# Patient Record
Sex: Female | Born: 1985 | Race: White | Hispanic: No | Marital: Single | Smoking: Never smoker
Health system: Southern US, Community
[De-identification: ages and names within clinical notes are randomized; demographics above are authoritative.]

## PROBLEM LIST (undated history)

## (undated) DIAGNOSIS — N390 Urinary tract infection, site not specified: Secondary | ICD-10-CM

## (undated) DIAGNOSIS — F419 Anxiety disorder, unspecified: Secondary | ICD-10-CM

## (undated) DIAGNOSIS — R519 Headache, unspecified: Secondary | ICD-10-CM

## (undated) DIAGNOSIS — F32A Depression, unspecified: Secondary | ICD-10-CM

## (undated) DIAGNOSIS — M1990 Unspecified osteoarthritis, unspecified site: Secondary | ICD-10-CM

## (undated) DIAGNOSIS — I639 Cerebral infarction, unspecified: Secondary | ICD-10-CM

## (undated) DIAGNOSIS — Z8619 Personal history of other infectious and parasitic diseases: Secondary | ICD-10-CM

## (undated) DIAGNOSIS — H6690 Otitis media, unspecified, unspecified ear: Secondary | ICD-10-CM

## (undated) DIAGNOSIS — F329 Major depressive disorder, single episode, unspecified: Secondary | ICD-10-CM

## (undated) DIAGNOSIS — R51 Headache: Secondary | ICD-10-CM

## (undated) DIAGNOSIS — M069 Rheumatoid arthritis, unspecified: Secondary | ICD-10-CM

## (undated) DIAGNOSIS — D649 Anemia, unspecified: Secondary | ICD-10-CM

## (undated) HISTORY — DX: Personal history of other infectious and parasitic diseases: Z86.19

## (undated) HISTORY — PX: TONSILECTOMY, ADENOIDECTOMY, BILATERAL MYRINGOTOMY AND TUBES: SHX2538

## (undated) HISTORY — DX: Unspecified osteoarthritis, unspecified site: M19.90

## (undated) HISTORY — DX: Urinary tract infection, site not specified: N39.0

## (undated) HISTORY — DX: Cerebral infarction, unspecified: I63.9

## (undated) HISTORY — DX: Headache, unspecified: R51.9

## (undated) HISTORY — DX: Headache: R51

## (undated) HISTORY — DX: Otitis media, unspecified, unspecified ear: H66.90

## (undated) HISTORY — DX: Rheumatoid arthritis, unspecified: M06.9

## (undated) HISTORY — DX: Major depressive disorder, single episode, unspecified: F32.9

## (undated) HISTORY — DX: Depression, unspecified: F32.A

## (undated) HISTORY — DX: Anemia, unspecified: D64.9

---
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
## 2005-04-09 ENCOUNTER — Other Ambulatory Visit: Admission: RE | Admit: 2005-04-09 | Discharge: 2005-04-09 | Payer: Self-pay | Admitting: Obstetrics and Gynecology

## 2006-05-22 ENCOUNTER — Other Ambulatory Visit: Admission: RE | Admit: 2006-05-22 | Discharge: 2006-05-22 | Payer: Self-pay | Admitting: Obstetrics & Gynecology

## 2007-06-01 ENCOUNTER — Other Ambulatory Visit: Admission: RE | Admit: 2007-06-01 | Discharge: 2007-06-01 | Payer: Self-pay | Admitting: Obstetrics and Gynecology

## 2008-03-27 ENCOUNTER — Emergency Department (HOSPITAL_COMMUNITY): Admission: EM | Admit: 2008-03-27 | Discharge: 2008-03-27 | Payer: Self-pay | Admitting: Emergency Medicine

## 2008-08-07 ENCOUNTER — Other Ambulatory Visit: Admission: RE | Admit: 2008-08-07 | Discharge: 2008-08-07 | Payer: Self-pay | Admitting: Obstetrics & Gynecology

## 2010-04-14 HISTORY — PX: APPENDECTOMY: SHX54

## 2010-10-08 ENCOUNTER — Inpatient Hospital Stay: Payer: Self-pay | Admitting: Surgery

## 2010-10-10 LAB — PATHOLOGY REPORT

## 2011-01-17 LAB — URINALYSIS, ROUTINE W REFLEX MICROSCOPIC
Glucose, UA: NEGATIVE mg/dL
Hgb urine dipstick: NEGATIVE
Ketones, ur: NEGATIVE mg/dL
Protein, ur: NEGATIVE mg/dL
pH: 5.5 (ref 5.0–8.0)

## 2011-08-21 IMAGING — CT CT ABD-PELV W/ CM
1 of 2 series · 15 of 32 positions shown, 19 images · non-contrast
Comparison: none

REASON FOR EXAM: (1) abdominal pain; (2) abdominal pain
COMMENTS:   May transport without cardiac monitor

[Series 2: 3mm soft tissue · axial · 0.81mm/px · z∈[-982,-532]mm · 15 of 164 slices shown, 19 images]
[im 7/164  soft-tissue]
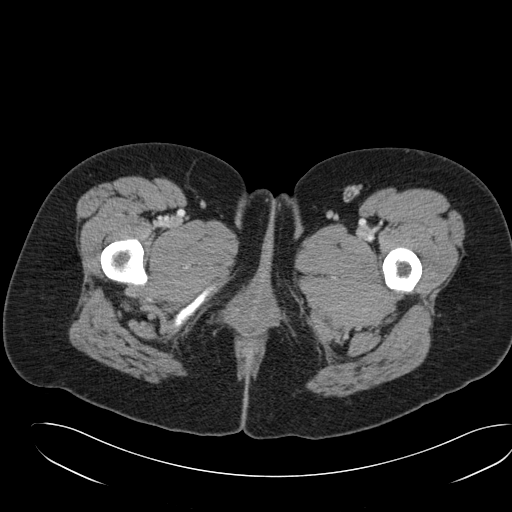
[im 7/164  bone]
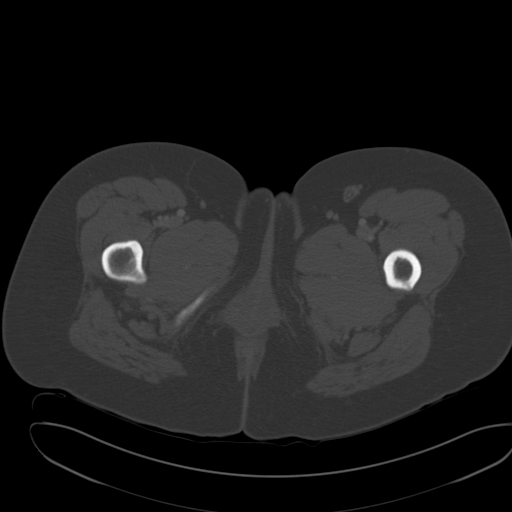
[im 21/164  soft-tissue]
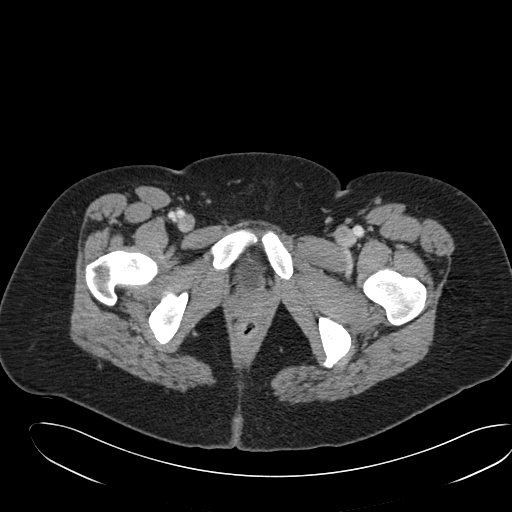
[im 34/164  soft-tissue]
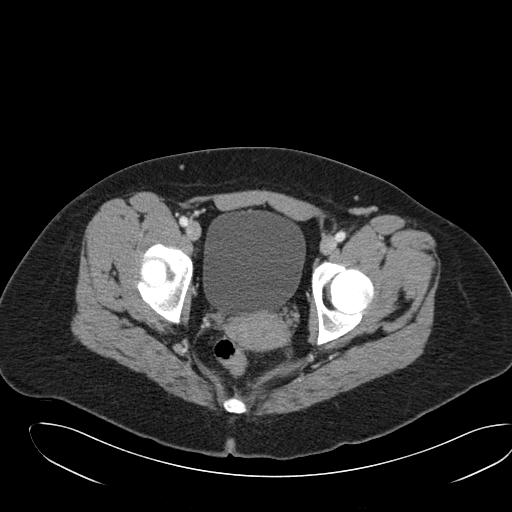
[im 48/164  soft-tissue]
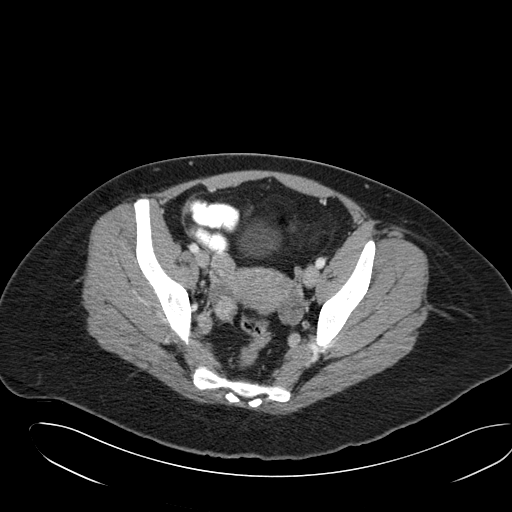
[im 55/164  soft-tissue]
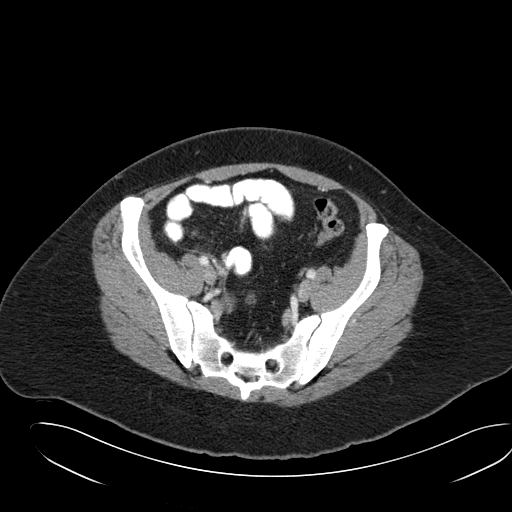
[im 68/164  soft-tissue]
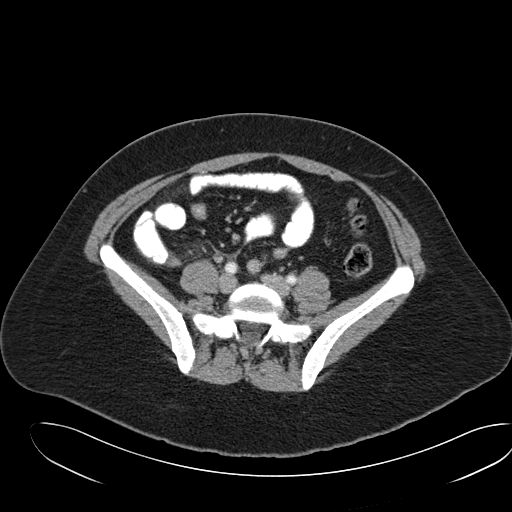
[im 82/164  soft-tissue]
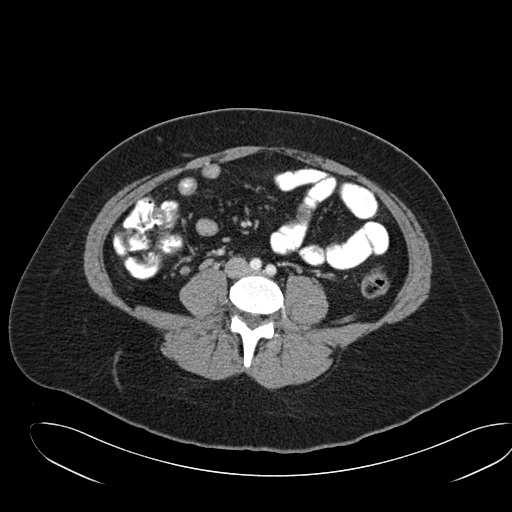
[im 96/164  soft-tissue]
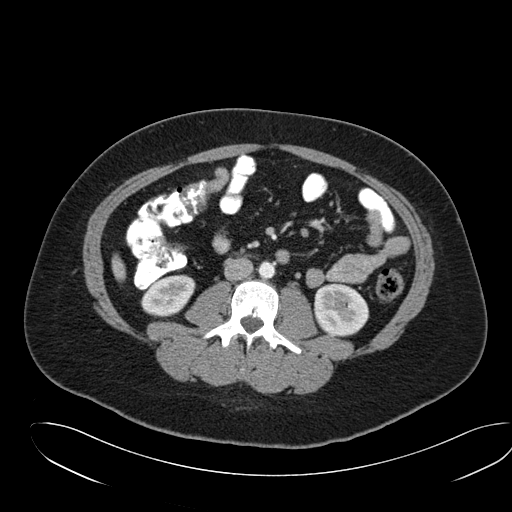
[im 109/164  soft-tissue]
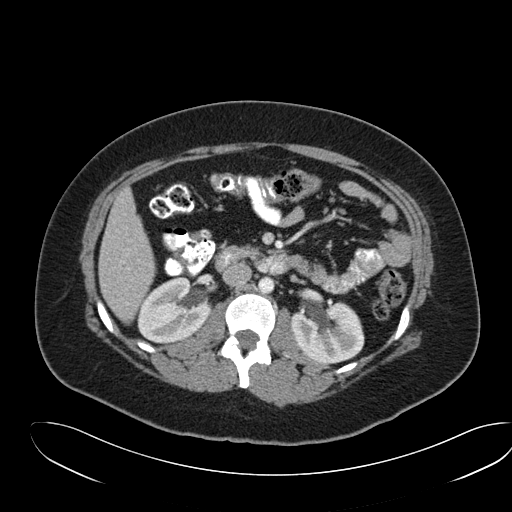
[im 109/164  bone]
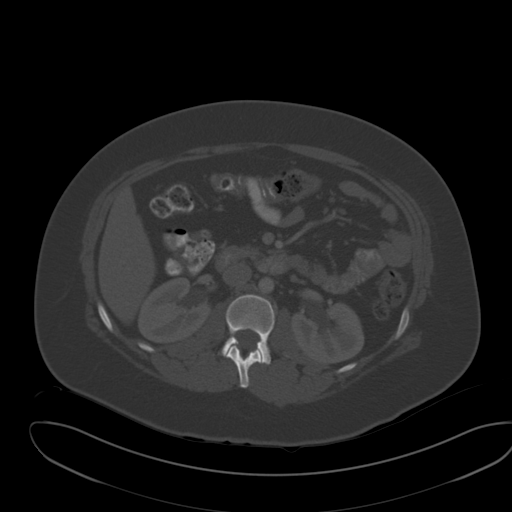
[im 116/164  soft-tissue]
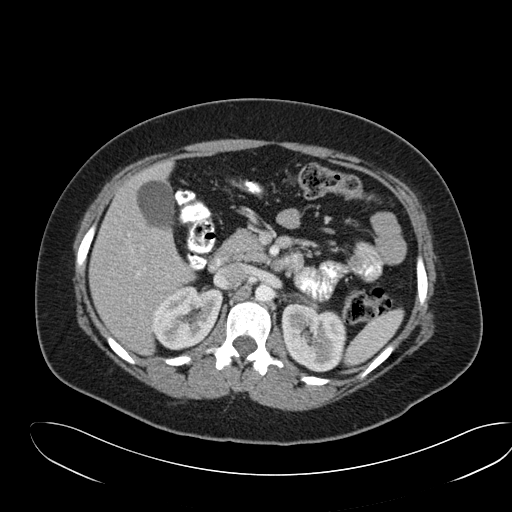
[im 130/164  soft-tissue]
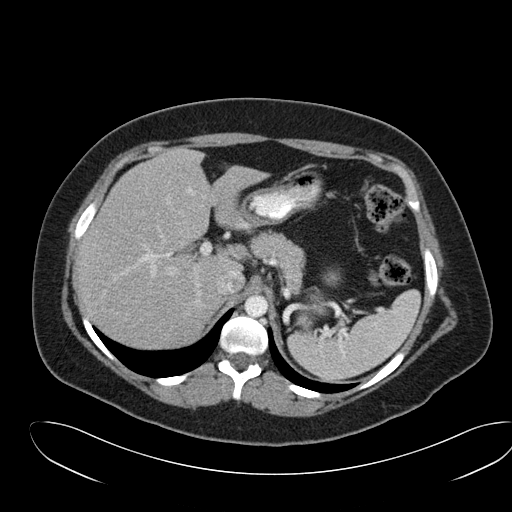
[im 136/164  lung]
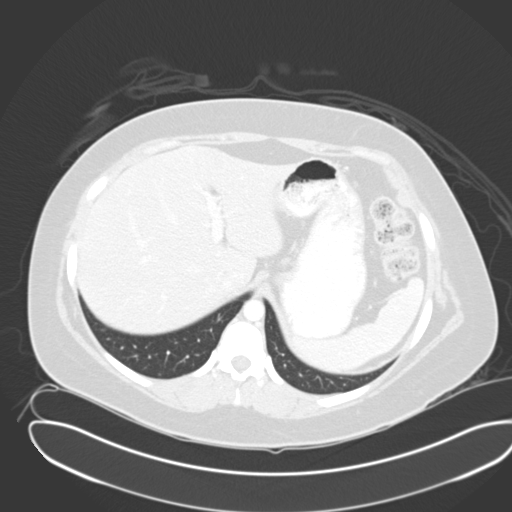
[im 143/164  soft-tissue]
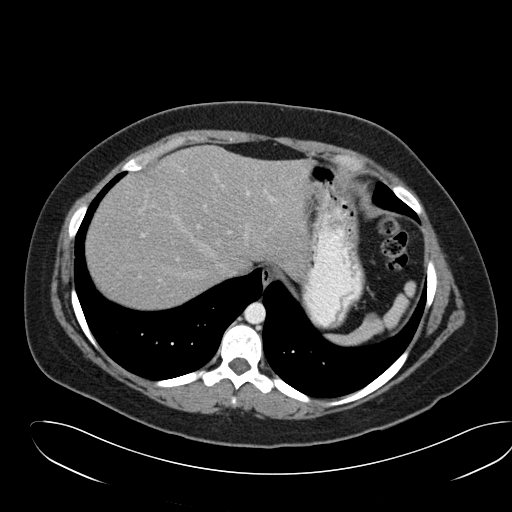
[im 143/164  lung]
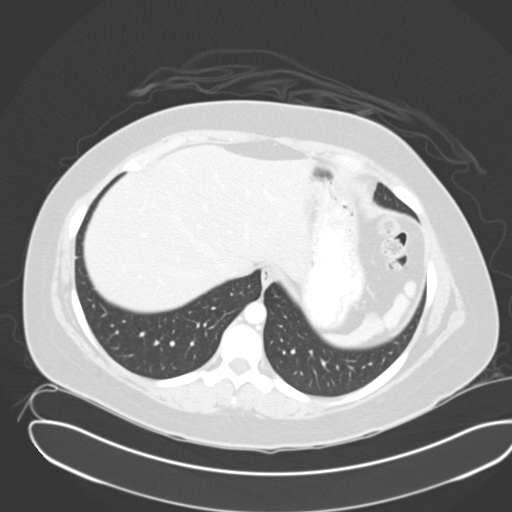
[im 150/164  lung]
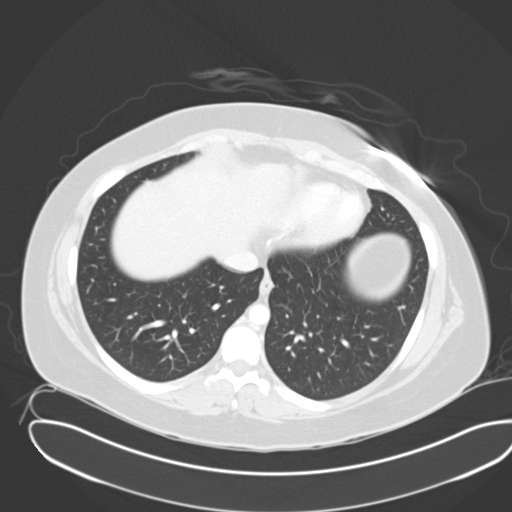
[im 157/164  soft-tissue]
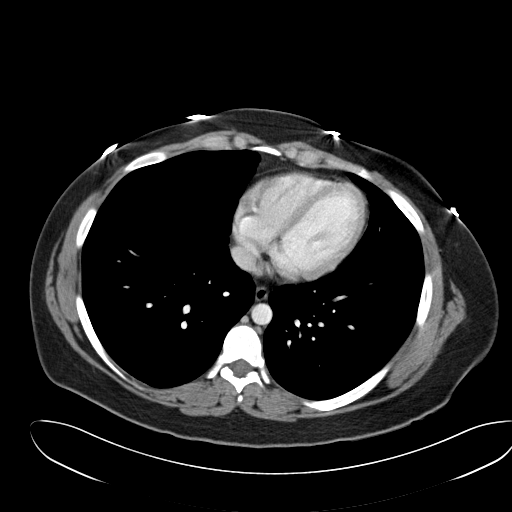
[im 157/164  lung]
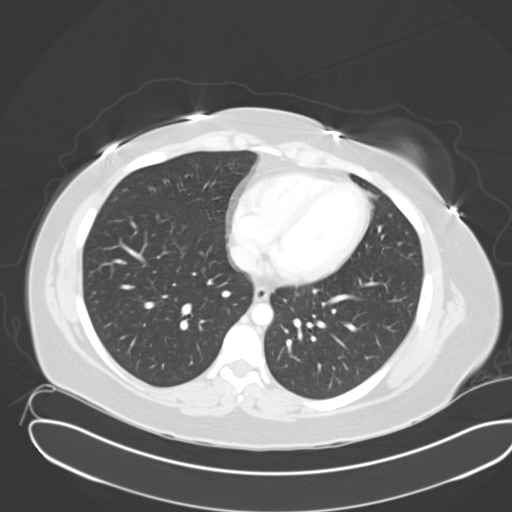

[15 of 32 positions shown; findings below may reference images not displayed]

PROCEDURE:     CT  - CT ABDOMEN / PELVIS  W  - October 08, 2010  [DATE]

RESULT:     CT of the abdomen and pelvis is performed utilizing 100 mL of
0sovue-T9T iodinated intravenous contrast. There is no previous exam for
comparison.

3 mm slice thickness reconstruction show the lung bases are clear. There is
mild periappendiceal inflammatory stranding with a slightly thickened
appendiceal wall. There is no evidence of perforation or abscess formation.
The uterus and ovaries as well as urinary bladder appear within normal
limits. There is no evidence of abnormal bowel distention. The liver,
spleen, pancreas, gallbladder, adrenal glands, aorta and kidneys appear
unremarkable. There does appear to be some slight thickening of the wall of
a portion of the ileum in the right pelvis an anterior mid pelvic region
raising the possibility of enteritis. The colon appears unremarkable.
IMPRESSION: Findings suggestive of scattered enteritis and
appendicitis. Surgical consultation is recommended.

## 2012-04-29 ENCOUNTER — Ambulatory Visit (INDEPENDENT_AMBULATORY_CARE_PROVIDER_SITE_OTHER): Payer: Managed Care, Other (non HMO) | Admitting: Family Medicine

## 2012-04-29 VITALS — BP 142/85 | HR 80 | Temp 98.4°F | Resp 16 | Ht 67.0 in | Wt 211.0 lb

## 2012-04-29 DIAGNOSIS — R197 Diarrhea, unspecified: Secondary | ICD-10-CM

## 2012-04-29 DIAGNOSIS — R1084 Generalized abdominal pain: Secondary | ICD-10-CM

## 2012-04-29 DIAGNOSIS — M069 Rheumatoid arthritis, unspecified: Secondary | ICD-10-CM | POA: Insufficient documentation

## 2012-04-29 LAB — COMPREHENSIVE METABOLIC PANEL WITH GFR
ALT: 9 U/L (ref 0–35)
AST: 10 U/L (ref 0–37)
Albumin: 3.8 g/dL (ref 3.5–5.2)
Alkaline Phosphatase: 80 U/L (ref 39–117)
BUN: 11 mg/dL (ref 6–23)
CO2: 23 meq/L (ref 19–32)
Calcium: 9.3 mg/dL (ref 8.4–10.5)
Chloride: 108 meq/L (ref 96–112)
Creat: 0.8 mg/dL (ref 0.50–1.10)
Glucose, Bld: 72 mg/dL (ref 70–99)
Potassium: 4.7 meq/L (ref 3.5–5.3)
Sodium: 141 meq/L (ref 135–145)
Total Bilirubin: 0.4 mg/dL (ref 0.3–1.2)
Total Protein: 7.1 g/dL (ref 6.0–8.3)

## 2012-04-29 LAB — POCT CBC
Granulocyte percent: 76.9 % (ref 37–80)
HCT, POC: 44.7 % (ref 37.7–47.9)
Hemoglobin: 13.6 g/dL (ref 12.2–16.2)
Lymph, poc: 2.6 (ref 0.6–3.4)
MCH, POC: 24.6 pg — AB (ref 27–31.2)
MCHC: 30.4 g/dL — AB (ref 31.8–35.4)
MCV: 80.8 fL (ref 80–97)
MID (cbc): 0.7 (ref 0–0.9)
MPV: 10.8 fL (ref 0–99.8)
POC Granulocyte: 11.2 — AB (ref 2–6.9)
POC LYMPH PERCENT: 18.1 % (ref 10–50)
POC MID %: 5 % (ref 0–12)
Platelet Count, POC: 361 K/uL (ref 142–424)
RBC: 5.53 M/uL — AB (ref 4.04–5.48)
RDW, POC: 15.6 %
WBC: 14.5 K/uL — AB (ref 4.6–10.2)

## 2012-04-29 LAB — POCT SEDIMENTATION RATE: POCT SED RATE: 31 mm/hr — AB (ref 0–22)

## 2012-04-29 MED ORDER — DICYCLOMINE HCL 10 MG PO CAPS: 10.0000 mg | ORAL_CAPSULE | Freq: Three times a day (TID) | ORAL | Status: DC

## 2012-04-29 NOTE — Progress Notes (Signed)
[address] [place] [address]   Phone [phone]  Subjective:    Patient ID: Laura [name], female    DOB: 1985/08/25, 27 y.o.   MRN: [id number]  HPI Pt presents to clinic with 2 wk h/o diarrhea. On the day that it started pt had been having trouble with constipation so she used a laxative (1 dose) she had abd cramping and then had a BM.  But she continued with abdominal cramping and diarrhea since then - the stool started loose and then went to green water and now it is mainly water with just a little amount of stool.  It was brown in color and then went to green and now it is back brown again.  It has a terrible odor.  She is not more gasy than normal. She is going between 4-10x per day and she only goes during the day and after she gets abdominal cramping.  She has had no fever or chills.  She has noticed that caffeine and sweets make the cramping worse.  She has used no OTC medications.  Over the last 2 days she has eaten almost nothing and drank almost nothing because they both cause immediate cramping and then diarrhea. No sick contacts with similar symptoms.  Some nausea but no vomiting.  She traveled to [facility] and drank city water, no camping or foreign travel and she has not taken any recent antibiotics.  She has RA and was started on Plaquenil about 1 wk ago after her symptoms started and because her eating has been so erratic she has not been using it regularly.  She was diagnosed with IBS when she was a teenager but really has not had problems with it since then.  She does not believe she has lost any weight and she is not dizzy when she changes positions.    Review of Systems  Constitutional: Negative for fever and chills.  HENT: Negative for congestion.   Gastrointestinal: Positive for nausea, abdominal pain and diarrhea. Negative for vomiting and blood in stool.  Musculoskeletal: Positive for arthralgias (pt has RA).  Skin: Negative.        Objective:   Physical Exam    Vitals reviewed. Constitutional: She appears well-developed and well-nourished.  HENT:  Head: Normocephalic and atraumatic.  Right Ear: External ear normal.  Left Ear: External ear normal.  Eyes: Conjunctivae normal are normal.  Cardiovascular: Normal rate, regular rhythm and normal heart sounds.   Pulmonary/Chest: Effort normal and breath sounds normal.  Abdominal: Soft. Bowel sounds are normal.  Skin: Skin is warm and dry.  Psychiatric: She has a normal mood and affect. Her behavior is normal. Judgment and thought content normal.   Results for orders placed in visit on 04/29/12  POCT CBC      Component Value Range   WBC 14.5 (*) 4.6 - 10.2 K/uL   Lymph, poc 2.6  0.6 - 3.4   POC LYMPH PERCENT 18.1  10 - 50 %L   MID (cbc) 0.7  0 - 0.9   POC MID % 5.0  0 - 12 %M   POC Granulocyte 11.2 (*) 2 - 6.9   Granulocyte percent 76.9  37 - 80 %G   RBC 5.53 (*) 4.04 - 5.48 M/uL   Hemoglobin 13.6  12.2 - 16.2 g/dL   HCT, POC 21.3  08.6 - 47.9 %   MCV 80.8  80 - 97 fL   MCH, POC 24.6 (*) 27 - 31.2 pg  MCHC 30.4 (*) 31.8 - 35.4 g/dL   RDW, POC 16.1     Platelet Count, POC 361  142 - 424 K/uL   MPV 10.8  0 - 99.8 fL  POCT SEDIMENTATION RATE      Component Value Range   POCT SED RATE 31 (*) 0 - 22 mm/hr        Assessment & Plan:   1. Diarrhea  Clostridium difficile toxin, Stool culture, POCT CBC, Comprehensive metabolic panel, POCT SEDIMENTATION RATE, Fecal lactoferrin, Ova and parasite examination, Giardia/cryptosporidium (EIA), dicyclomine (BENTYL) 10 MG capsule  2. Generalized abdominal cramping     Due to time frame will check for infectious causes including C Diff.  Most concerning for Giardia due to symptoms.  Offered pt medication for treatment after stool samples are collected but pt declined.  Will treat her symptoms with Bentyl and pt can use OTC imodium after stool samples are collected and returned to [place].  She should make sure she stays hydrated and watch lactose intake due  to lactose intolerance created by diarrhea and possible giardia.  If results from cultures return neg and pt still having problems with refer to GI for evaluation.  Doubt inflammatory bowel disease due to almost normal Sed rate.  Answered patient's questions and she agrees with the above plan. Did suggest maybe the patient hold off on her Plaquenil until after symptoms resolve but she is to call her rheumatologist 1st.  D/w Dr Alwyn Ren.

## 2012-04-30 NOTE — Progress Notes (Signed)
Discussed workup and treatment options in detail, as well as getting lab advice from Dr. Cleta Alberts.  Advised and agree with plan.

## 2012-05-03 ENCOUNTER — Telehealth: Payer: Self-pay

## 2012-05-03 LAB — GIARDIA/CRYPTOSPORIDIUM (EIA)
Cryptosporidium Screen (EIA): NEGATIVE
Giardia Screen (EIA): NEGATIVE

## 2012-05-03 LAB — OVA AND PARASITE EXAMINATION

## 2012-05-03 NOTE — Telephone Encounter (Signed)
PATIENT CALLED ASKING HOW LONG HER RESULTS WERE GOING TO TAKE, SHE FEELS VERY ANXIOUS. SHE CAME IN Thursday AND WAS NEVER TOLD HOW LONG IT WOULD TAKE OR WHAT WAS GOING TO HAPPEN NEXT WHEN THEY CAME IN. PLEASE NOTIFY PATIENT AS SOON AS POSSIBLE THANK YOU!

## 2012-05-03 NOTE — Telephone Encounter (Signed)
Please advise 

## 2012-05-04 LAB — STOOL CULTURE

## 2012-05-04 NOTE — Telephone Encounter (Signed)
So far cultures are negative. We are still waiting on C. Diff culture.  How is she doing? If symptoms are persisting and labs all negative will go ahead with GI referral.

## 2012-05-04 NOTE — Telephone Encounter (Signed)
Pt called back and stated that someone had called her last night and she assumed her results were in. I advised her that there is still one outstanding. Pt would appreciate it if we could review the labs that are back and call her if possible before she has to go to work at 3:30. Can someone else please review labs since Maralyn Sago is not in today?

## 2012-05-04 NOTE — Telephone Encounter (Signed)
Advised pt of results that are back. Pt reports that her Sxs are about the same. She is able to keep them under control w/the medication we Rxd and Pepto Bismol but she thought she was getting better and didn't take any medication on Sunday and her Sxs came right back. Pt agreed to GI referral if C Diff comes back negative.  Maralyn Sago, forwarding this FYI.

## 2012-05-09 ENCOUNTER — Other Ambulatory Visit: Payer: Self-pay | Admitting: Physician Assistant

## 2012-05-09 DIAGNOSIS — R197 Diarrhea, unspecified: Secondary | ICD-10-CM

## 2012-05-09 NOTE — Progress Notes (Signed)
I called and spoke with patient regarding her lab results.  She is still having diarrhea if she does not take pepto - she has stopped her Bentyl though it helped a lot at first.  Will do a referral to GI.

## 2012-05-13 LAB — CLOSTRIDIUM DIFFICILE TOXIN: C diff Toxin A: NOT DETECTED

## 2012-05-18 ENCOUNTER — Encounter: Payer: Self-pay | Admitting: Physician Assistant

## 2012-05-18 DIAGNOSIS — R198 Other specified symptoms and signs involving the digestive system and abdomen: Secondary | ICD-10-CM | POA: Insufficient documentation

## 2012-06-27 ENCOUNTER — Emergency Department (HOSPITAL_COMMUNITY)
Admission: EM | Admit: 2012-06-27 | Discharge: 2012-06-27 | Disposition: A | Discharge status: Home / Self Care | Payer: Commercial Indemnity | Attending: Emergency Medicine | Admitting: Emergency Medicine

## 2012-06-27 ENCOUNTER — Emergency Department (HOSPITAL_COMMUNITY): Payer: Commercial Indemnity

## 2012-06-27 DIAGNOSIS — M25476 Effusion, unspecified foot: Secondary | ICD-10-CM | POA: Insufficient documentation

## 2012-06-27 DIAGNOSIS — Y9389 Activity, other specified: Secondary | ICD-10-CM | POA: Insufficient documentation

## 2012-06-27 DIAGNOSIS — Y929 Unspecified place or not applicable: Secondary | ICD-10-CM | POA: Insufficient documentation

## 2012-06-27 DIAGNOSIS — Z79899 Other long term (current) drug therapy: Secondary | ICD-10-CM | POA: Insufficient documentation

## 2012-06-27 DIAGNOSIS — S93409A Sprain of unspecified ligament of unspecified ankle, initial encounter: Secondary | ICD-10-CM | POA: Insufficient documentation

## 2012-06-27 DIAGNOSIS — M25473 Effusion, unspecified ankle: Secondary | ICD-10-CM | POA: Insufficient documentation

## 2012-06-27 DIAGNOSIS — X500XXA Overexertion from strenuous movement or load, initial encounter: Secondary | ICD-10-CM | POA: Insufficient documentation

## 2012-06-27 DIAGNOSIS — M129 Arthropathy, unspecified: Secondary | ICD-10-CM | POA: Insufficient documentation

## 2012-06-27 DIAGNOSIS — S93402A Sprain of unspecified ligament of left ankle, initial encounter: Secondary | ICD-10-CM

## 2012-06-27 MED ORDER — HYDROMORPHONE HCL PF 1 MG/ML IJ SOLN
1.0000 mg | Freq: Once | INTRAMUSCULAR | Status: AC
  Administered 2012-06-27: 1 mg via INTRAMUSCULAR
  Filled 2012-06-27: qty 1

## 2012-06-27 MED ORDER — NAPROXEN 500 MG PO TABS: 500.0000 mg | ORAL_TABLET | Freq: Two times a day (BID) | ORAL | Status: DC

## 2012-06-27 MED ORDER — OXYCODONE-ACETAMINOPHEN 5-325 MG PO TABS
1.0000 | ORAL_TABLET | Freq: Once | ORAL | Status: DC
  Filled 2012-06-27: qty 1

## 2012-06-27 MED ORDER — HYDROCODONE-ACETAMINOPHEN 5-325 MG PO TABS: 2.0000 | ORAL_TABLET | Freq: Four times a day (QID) | ORAL | Status: DC | PRN

## 2012-06-27 NOTE — ED Provider Notes (Signed)
History  This chart was scribed for non-physician practitioner Magnus Sinning, PA-C working with Toy Baker, MD, by Candelaria Stagers, ED Scribe. This patient was seen in room WTR7/WTR7 and the patient's care was started at 3:18 PM   CSN: [id number]  Arrival date & time 06/27/12  1444   First MD Initiated Contact with Patient 06/27/12 1505      No chief complaint on file.    The history is provided by the patient. No language interpreter was used.   Laura [name] is a 27 y.o. female who presents to the Emergency Department complaining of generalized left ankle pain that is worse to the lateral malleolus that started two days ago after she states she popped the ankle which is gradually worsening.  Pt denies trauma or injury to the ankle.  She denies bruising to the area, but is experiencing swelling that she states has worsened over the last three days.  Pt has sprained the left ankle in the past.  She has taken Tramadol with no relief.  Pt is tearful upon examination.  Pt states she was diagnosed with rheumatoid arthritis.  She denies any erythema or warmth of the ankle.  No pain in any of her other joints.  Past Medical History  Diagnosis Date  . Arthritis     Past Surgical History  Procedure Laterality Date  . Appendectomy    . Tonsilectomy, adenoidectomy, bilateral myringotomy and tubes      Family History  Problem Relation Age of Onset  . Arthritis Paternal Grandmother     History  Substance Use Topics  . Smoking status: Never Smoker   . Smokeless tobacco: Not on file  . Alcohol Use: Yes    OB History   Grav Para Term Preterm Abortions TAB SAB Ect Mult Living                  Review of Systems  Musculoskeletal: Positive for joint swelling (swelling over the lateral malleolus) and arthralgias (left ankle pain ).  Skin: Negative for wound.  All other systems reviewed and are negative.    Allergies  Amoxicillin  Home Medications   Current  Outpatient Rx  Name  Route  Sig  Dispense  Refill  . dicyclomine (BENTYL) 10 MG capsule   Oral   Take 1 capsule (10 mg total) by mouth 4 (four) times daily -  before meals and at bedtime.   30 capsule   0   . etonogestrel-ethinyl estradiol (NUVARING) 0.12-0.015 MG/24HR vaginal ring   Vaginal   Place 1 each vaginally every 28 (twenty-eight) days. Insert vaginally and leave in place for 3 consecutive weeks, then remove for 1 week.         . hydroxychloroquine (PLAQUENIL) 200 MG tablet   Oral   Take by mouth daily.           BP 152/113  Pulse 113  Temp(Src) 98.6 F (37 C) (Oral)  Resp 20  SpO2 96%  LMP 06/13/2012  Physical Exam  Nursing note and vitals reviewed. Constitutional: She is oriented to person, place, and time. She appears well-developed and well-nourished. No distress.  Pt is tearful upon examination.  Appears uncomfortable.   HENT:  Head: Normocephalic and atraumatic.  Eyes: EOM are normal.  Neck: Neck supple. No tracheal deviation present.  Cardiovascular: Normal rate and regular rhythm.   Pulmonary/Chest: Effort normal and breath sounds normal. No respiratory distress.  Musculoskeletal: Normal range of motion.  2+ dorsal pedal  pulse.  Achilles intact.  Tenderness to palpation of the medial and lateral malleolus.  Mild swelling of the lateral malleolus.  No bruising or erythema visualized.  ROM of left ankle limited secondary to pain.  Full ROM of left knee.  Able to wiggle toes on the left foot.    Neurological: She is alert and oriented to person, place, and time.  Sensation of all toes intact.    Skin: Skin is warm and dry.  Psychiatric: She has a normal mood and affect. Her behavior is normal.    ED Course  Procedures   DIAGNOSTIC STUDIES:    COORDINATION OF CARE:  3:30 PM Pt request pain medication.  Ordered 5-325 MG tablet of Percocet  3:43 PM Discussed course of care with pt which includes ankle aso, crutches, and pain medication.  Pt  understands and agrees.   Labs Reviewed - No data to display Dg Ankle Complete Left  06/27/2012  *RADIOLOGY REPORT*  Clinical Data: Left ankle pain following injury.  LEFT ANKLE COMPLETE - 3+ VIEW  Comparison: None  Findings: No evidence of acute fracture, subluxation or dislocation identified.  No radio-opaque foreign bodies are present.  No focal bony lesions are noted.  The joint spaces are unremarkable.  The ankle mortise is intact.  The talar dome is unremarkable.  Lateral soft tissue swelling is present.  IMPRESSION: Soft tissue swelling without acute bony abnormality.   Original Report Authenticated By: Harmon Pier, M.D.      No diagnosis found.    MDM  Patient presenting with ankle pain.  Xray negative.  Neurovascularly intact.  No erythema, warmth, or bruising of the ankle.  Patient given Ankle ASO and crutches.    I personally performed the services described in this documentation, which was scribed in my presence. The recorded information has been reviewed and is accurate.       Pascal Lux [place], PA-C 06/27/12 1627

## 2012-06-27 NOTE — ED Notes (Signed)
Pt states she popped her left ankle on Friday and pain has become increasingly worse pt is tearful. Pain is on outer ankle and around back of ankle. Pt outer ankle in edematous.

## 2012-06-28 NOTE — ED Provider Notes (Signed)
Medical screening examination/treatment/procedure(s) were conducted as a shared visit with non-physician practitioner(s) and myself.  I personally evaluated the patient during the encounter  Merit Gadsby T Sriram Febles, MD 06/28/12 1437 

## 2012-11-24 ENCOUNTER — Other Ambulatory Visit: Payer: Self-pay | Admitting: Certified Nurse Midwife

## 2012-11-25 ENCOUNTER — Telehealth: Payer: Self-pay | Admitting: Certified Nurse Midwife

## 2012-11-25 MED ORDER — ETONOGESTREL-ETHINYL ESTRADIOL 0.12-0.015 MG/24HR VA RING: 1.0000 | VAGINAL_RING | VAGINAL | Status: DC

## 2012-11-25 NOTE — Telephone Encounter (Signed)
Pt is requesting refill on NUVARING.   Last filled - 11/18/11, X 8 MONTHS Last AEX - 04/17/11 Next AEX - not scheduled   Pt states she should have inserted new ring yesterday.  Is it OK for pt to insert late?  Is this OK to refill if pt schedules? Please advise.

## 2012-11-25 NOTE — Telephone Encounter (Addendum)
Patient needs her nuvaring refilled . Need to have this refilled today . Already a day be hide.

## 2012-11-25 NOTE — Telephone Encounter (Signed)
Patient is notified of Nuvaring being refilled only once,  because pt needs a AEX. (Is aware that she cannot have anymore refills until this is done.  Pt states that she will no longer be using our practice as her GYN.  Nuvaring #[id number]'s sent to [facility], pt is aware.

## 2012-11-25 NOTE — Telephone Encounter (Signed)
eScribe request for refill on NUVARING Last filled - 11/18/11, X 8 MONTHS Last AEX - 04/17/11 Next AEX - not scheduled   Pt also called requesting refill.  Note to sent Sara Chu for advice.

## 2012-11-25 NOTE — Telephone Encounter (Signed)
Ok to refill one Nuvaring needs aex before any other refills

## 2014-03-28 ENCOUNTER — Encounter: Payer: Self-pay | Admitting: Family Medicine

## 2014-03-28 ENCOUNTER — Ambulatory Visit (INDEPENDENT_AMBULATORY_CARE_PROVIDER_SITE_OTHER): Payer: Commercial Indemnity | Admitting: Family Medicine

## 2014-03-28 VITALS — BP 130/82 | HR 76 | Temp 98.2°F | Ht 65.5 in | Wt 230.1 lb

## 2014-03-28 DIAGNOSIS — Z7689 Persons encountering health services in other specified circumstances: Secondary | ICD-10-CM

## 2014-03-28 DIAGNOSIS — F329 Major depressive disorder, single episode, unspecified: Secondary | ICD-10-CM | POA: Insufficient documentation

## 2014-03-28 DIAGNOSIS — Z7189 Other specified counseling: Secondary | ICD-10-CM

## 2014-03-28 DIAGNOSIS — M2550 Pain in unspecified joint: Secondary | ICD-10-CM | POA: Insufficient documentation

## 2014-03-28 DIAGNOSIS — F32A Depression, unspecified: Secondary | ICD-10-CM | POA: Insufficient documentation

## 2014-03-28 DIAGNOSIS — K589 Irritable bowel syndrome without diarrhea: Secondary | ICD-10-CM

## 2014-03-28 DIAGNOSIS — Z8719 Personal history of other diseases of the digestive system: Secondary | ICD-10-CM | POA: Insufficient documentation

## 2014-03-28 DIAGNOSIS — R739 Hyperglycemia, unspecified: Secondary | ICD-10-CM

## 2014-03-28 HISTORY — DX: Depression, unspecified: F32.A

## 2014-03-28 MED ORDER — DICYCLOMINE HCL 20 MG PO TABS: 20.0000 mg | ORAL_TABLET | Freq: Four times a day (QID) | ORAL | Status: DC

## 2014-03-28 NOTE — Progress Notes (Signed)
Pre visit review using our clinic review tool, if applicable. No additional management support is needed unless otherwise documented below in the visit note. 

## 2014-03-28 NOTE — Progress Notes (Signed)
HPI:  Laura [name] is here to establish care. New to getting a family doctor - last family doctor stopped practicing 7 years ago. Sees Dr. Linda Hedges in gyn for annual exams, and heavy bleeding. Also seeing a psychiatrist, Dr. Debbora Dus for depression and on Lexapro and a rheumatologist - Dr. Ouida Sills for RA. Reports had hgba1c and lipid in September and these were good except PRE-diabetes.  Has the following chronic problems that require follow up and concerns today:  Depression: -seeing psychiatrist (Dr. 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 Ouida Sills, reports has actually doing well  IBS: -diagnosed a long time ago -for the last few years intermittent symptoms -seen in urgent care earlier this year and reports had stool studies and labs and all normal -has had constipation and diarrhea in the past, pain before episodes -the last few years intermittent diarrhea and urgency to go, cramping -reports saw a gastroenterologist a few years ago and did trial probiotic which made things worse -denies: weight loss, blood in stools, weight loss, nausea or vomiting   ROS negative for unless reported above: fevers, unintentional weight loss, hearing or vision loss, chest pain, palpitations, struggling to breath, hemoptysis, melena, hematochezia, hematuria, falls, loc, si, thoughts of self harm  Past Medical History  Diagnosis Date  . Arthritis   . Depression   . Rheumatoid arthritis   . Frequent headaches   . History of chicken pox   . UTI (lower urinary tract infection)     Past Surgical History  Procedure Laterality Date  . Tonsilectomy, adenoidectomy, bilateral myringotomy and tubes    . Hymenectomy  2000  . Appendectomy  2012    Family History  Problem Relation Age of  Onset  . Arthritis Paternal Grandmother     History   Social History  . Marital Status: Single    Spouse Name: N/A    Number of Children: N/A  . Years of Education: N/A   Social History Main Topics  . Smoking status: Never Smoker   . Smokeless tobacco: None  . Alcohol Use: Yes  . Drug Use: No  . Sexual Activity: None   Other Topics Concern  . 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 As needed, Disp: 120 tablet, Rfl: 1;  escitalopram (LEXAPRO) 10 MG tablet, Take by mouth daily. , Disp: , Rfl: ;  ZENCHENT FE 0.4-35 MG-MCG tablet, Chew by mouth daily. , Disp: , Rfl:   EXAM:  Filed Vitals:   03/28/14 1413  BP: 130/82  Pulse: 76  Temp: 98.2 F (36.8 C)    Body mass index is 37.69 kg/(m^2).  GENERAL: vitals reviewed and listed above, alert, oriented, appears well hydrated and in no acute distress  HEENT: atraumatic, conjunttiva clear, no obvious abnormalities on inspection of external nose and ears  NECK: no obvious masses on inspection  LUNGS: clear to auscultation bilaterally, no wheezes, rales or rhonchi, good air  movement  ABD: BS+, soft, NTTP  CV: HRRR, no peripheral edema  MS: moves all extremities without noticeable abnormality  PSYCH: pleasant and cooperative, no obvious depression or anxiety  ASSESSMENT AND PLAN:  Discussed the following assessment and plan:  IBS (irritable bowel syndrome) - Plan: dicyclomine (BENTYL) 20 MG tablet  Polyarthralgia  Depression  Encounter to establish care  History of IBS  Hyperglycemia  -We reviewed the PMH, PSH, FH, SH, Meds and Allergies. -We provided refills for any medications we will prescribe as  needed. -We addressed current concerns per orders and patient instructions. -We have asked for records for pertinent exams, studies, vaccines and notes from previous providers. -We have advised patient to follow up per instructions below. -TTG, trial FODMAP diet and trial bentyl for bowel issues -she is getting TSH, CMP and some other labs at [place] with psych and will let me know on these results -Hgba1c recheck here for baseline -lab order given to take to her work -follow up 2 months  -Patient advised to return or notify a doctor immediately if symptoms worsen or persist or new concerns arise.  Patient Instructions  BEFORE YOU LEAVE: -schedule follow up in 2 months  -We have ordered labs or studies at this visit. It can take up to 1-2 weeks for results and processing. We will contact you with instructions IF your results are abnormal. Normal results will be released to your [facility]. If you have not heard from [place] or can not find your results in [facility] in 2 weeks please contact our office.  -PLEASE SIGN UP FOR MYCHART TODAY   We recommend the following healthy lifestyle measures: - eat a healthy diet consisting of lots of vegetables, fruits, beans, nuts, seeds, healthy meats such as white chicken and fish and whole grains.  - avoid fried foods, fast food, processed foods, sodas, red meet and other fattening foods.  - get a least 150 minutes of aerobic exercise per week.        Colin Benton R.

## 2014-03-28 NOTE — Patient Instructions (Signed)
BEFORE YOU LEAVE: -schedule follow up in 2 months  -We have ordered labs or studies at this visit. It can take up to 1-2 weeks for results and processing. We will contact you with instructions IF your results are abnormal. Normal results will be released to your [facility]. If you have not heard from [place] or can not find your results in [facility] in 2 weeks please contact our office.  -PLEASE SIGN UP FOR MYCHART TODAY   We recommend the following healthy lifestyle measures: - eat a healthy diet consisting of lots of vegetables, fruits, beans, nuts, seeds, healthy meats such as white chicken and fish and whole grains.  - avoid fried foods, fast food, processed foods, sodas, red meet and other fattening foods.  - get a least 150 minutes of aerobic exercise per week.

## 2014-05-29 ENCOUNTER — Ambulatory Visit: Payer: Commercial Indemnity | Admitting: Family Medicine

## 2014-06-14 ENCOUNTER — Encounter: Payer: Self-pay | Admitting: Family Medicine

## 2014-06-15 ENCOUNTER — Ambulatory Visit (INDEPENDENT_AMBULATORY_CARE_PROVIDER_SITE_OTHER): Payer: Commercial Indemnity | Admitting: Family Medicine

## 2014-06-15 ENCOUNTER — Encounter: Payer: Self-pay | Admitting: Family Medicine

## 2014-06-15 VITALS — HR 91 | Temp 98.3°F | Ht 65.5 in | Wt 230.2 lb

## 2014-06-15 DIAGNOSIS — E559 Vitamin D deficiency, unspecified: Secondary | ICD-10-CM

## 2014-06-15 DIAGNOSIS — K589 Irritable bowel syndrome without diarrhea: Secondary | ICD-10-CM

## 2014-06-15 DIAGNOSIS — F329 Major depressive disorder, single episode, unspecified: Secondary | ICD-10-CM

## 2014-06-15 DIAGNOSIS — F32A Depression, unspecified: Secondary | ICD-10-CM

## 2014-06-15 DIAGNOSIS — M2550 Pain in unspecified joint: Secondary | ICD-10-CM

## 2014-06-15 NOTE — Progress Notes (Signed)
Pre visit review using our clinic review tool, if applicable. No additional management support is needed unless otherwise documented below in the visit note. 

## 2014-06-15 NOTE — Patient Instructions (Signed)
Vit D3 1000-2000 IU daily ([facility] brand)

## 2014-06-15 NOTE — Progress Notes (Signed)
HPI:  Laura [name] is here for follow up. New to our practice 03/2014. Sees Dr. Linda Hedges in gyn for annual exams, and heavy bleeding. Also seeing a psychiatrist, Dr. Debbora Dus for depression and a rheumatologist - Dr. Ouida Sills for RA. Reports had hgba1c and lipid in September and these were good except PRE-diabetes.  HM: -HIV declined -pap doing next week with gyn -Tdap - done in 2013 er her report -influenza vaccine declined  IBS: -diagnosed a long time ago -for the last few years intermittent symptoms -seen in urgent care earlier this year and reports had stool studies and labs and all normal -has had constipation and diarrhea in the past, pain before episodes -the last few years intermittent diarrhea and urgency to go, cramping -reports saw a gastroenterologist a few years ago and did trial probiotic which made things worse -ttg negative, she reported doing TSH, CMP and other labs all ok except Vit D low -trial FODMAP 03/2014 and bentyl last visit - but she reports hasn't really had many symptoms -denies: weight loss, blood in stools, nausea or vomiting  Hyperglycemia: -prediabetes on labs 12/2013 per her report -HgbA1c 5.6 on our recheck -diet and exercise: -denies:vision changes, polyuria, polydipsia  Depression: -managed by psychiatrist psychiatrist (Dr. 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 Ouida Sills, reports has actually doing well  Vit D def: -9 on recent labs   ROS: See pertinent positives and negatives per HPI.  Past Medical History  Diagnosis Date  . Arthritis   . Depression   . Rheumatoid arthritis   . Frequent headaches   . History of chicken pox   . UTI (lower urinary tract infection)     Past Surgical History  Procedure Laterality Date  .  Tonsilectomy, adenoidectomy, bilateral myringotomy and tubes    . Hymenectomy  2000  . Appendectomy  2012    Family History  Problem Relation Age of Onset  . Arthritis Paternal Grandmother     History   Social History  . Marital Status: Single    Spouse Name: N/A  . Number of Children: N/A  . Years of Education: N/A   Social History Main Topics  . Smoking status: Never Smoker   . Smokeless tobacco: Not on file  . Alcohol Use: Yes  . Drug Use: No  . Sexual Activity: Not on file   Other Topics Concern  . 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  escitalopram (LEXAPRO) 10 MG tablet, Take by mouth daily. , Disp: , Rfl:  .  ZENCHENT FE 0.4-35 MG-MCG tablet, Chew by mouth daily. , Disp: , Rfl:  .  dicyclomine (BENTYL) 20 MG tablet, Take 1 tablet (20 mg total) by mouth every 6 (six) hours. As needed (Patient not taking: Reported on 06/15/2014), Disp: 120 tablet, Rfl: 1  EXAM:  Filed Vitals:   06/15/14 1304  Pulse: 91  Temp: 98.3 F (36.8 C)    Body mass index is 37.71 kg/(m^2).  GENERAL: vitals reviewed and listed above, alert,  oriented, appears well hydrated and in no acute distress  HEENT: atraumatic, conjunttiva clear, no obvious abnormalities on inspection of external nose and ears  NECK: no obvious masses on inspection  LUNGS: clear to auscultation bilaterally, no wheezes, rales or rhonchi, good air movement  CV: HRRR, no peripheral edema  MS: moves all extremities without noticeable abnormality  PSYCH: pleasant and cooperative, no obvious depression or anxiety  ASSESSMENT AND PLAN:  Discussed the following assessment and plan:  Vitamin D deficiency  Depression  IBS (irritable bowel syndrome)  Polyarthralgia  -Written order provided to  check Vit D in 3 months -diet and bentyl if needed - though bowel issues resolved now -lifestyle recs -gets paps and physical with gyn -HM recs reviewed and updated -Patient advised to return or notify a doctor immediately if symptoms worsen or persist or new concerns arise.  Patient Instructions  Vit D3 1000-2000 IU daily ([facility] brand)       Colin Benton R.

## 2014-12-25 ENCOUNTER — Encounter: Payer: Self-pay | Admitting: Family Medicine

## 2014-12-25 ENCOUNTER — Ambulatory Visit (INDEPENDENT_AMBULATORY_CARE_PROVIDER_SITE_OTHER): Payer: Commercial Indemnity | Admitting: Family Medicine

## 2014-12-25 VITALS — BP 112/82 | HR 77 | Temp 97.7°F | Ht 65.5 in | Wt 239.5 lb

## 2014-12-25 DIAGNOSIS — Z0000 Encounter for general adult medical examination without abnormal findings: Secondary | ICD-10-CM | POA: Diagnosis not present

## 2014-12-25 DIAGNOSIS — J069 Acute upper respiratory infection, unspecified: Secondary | ICD-10-CM

## 2014-12-25 DIAGNOSIS — H73011 Bullous myringitis, right ear: Secondary | ICD-10-CM | POA: Diagnosis not present

## 2014-12-25 MED ORDER — AZITHROMYCIN 250 MG PO TABS: ORAL_TABLET | ORAL | Status: DC

## 2014-12-25 MED ORDER — HYDROCODONE-HOMATROPINE 5-1.5 MG/5ML PO SYRP: 5.0000 mL | ORAL_SOLUTION | Freq: Three times a day (TID) | ORAL | Status: DC | PRN

## 2014-12-25 NOTE — Progress Notes (Signed)
HPI:  Laura [name] is here for follow up. New to our practice 03/2014. Sees Dr. Linda Hedges in gyn for annual exams, and heavy bleeding. Also seeing a psychiatrist, Dr. Debbora Dus for depression and a rheumatologist - Dr. Ouida Sills for RA. Here for an acute visit for:  URI/EAR issues: -pressure in R ear and ? Mild hearing loss for 1-2 weeks -for last 4 days sinus congestion, sore throat, drainage in throat, cough -denies: fevers, SOB, persistent sinus pain, recent travel, bodyaches, NVD -sees ENT for chronic ear issues and hx tubes  HM: -HIV  Wants to do today -pap done with gyn this year, getting yearly -influenza vaccine declined  Reports had Vit D check last with with psych and 49 Reports hgba1c in August 5.8    ROS: See pertinent positives and negatives per HPI.  Past Medical History  Diagnosis Date  . Arthritis   . Depression   . Rheumatoid arthritis   . Frequent headaches   . History of chicken pox   . UTI (lower urinary tract infection)     Past Surgical History  Procedure Laterality Date  . Tonsilectomy, adenoidectomy, bilateral myringotomy and tubes    . Hymenectomy  2000  . Appendectomy  2012    Family History  Problem Relation Age of Onset  . Arthritis Paternal Grandmother     Social History   Social History  . Marital Status: Single    Spouse Name: N/A  . Number of Children: N/A  . Years of Education: N/A   Social History Main Topics  . Smoking status: Never Smoker   . Smokeless tobacco: None  . Alcohol Use: Yes  . Drug Use: No  . Sexual Activity: Not Asked   Other Topics Concern  . 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  escitalopram (LEXAPRO) 10 MG tablet, Take by mouth daily. , Disp: , Rfl:  .  ZENCHENT FE  0.4-35 MG-MCG tablet, Chew by mouth daily. , Disp: , Rfl:  .  azithromycin (ZITHROMAX) 250 MG tablet, 2 tabs day one, then 1 tab daily, Disp: 6 tablet, Rfl: 0 .  HYDROcodone-homatropine (HYCODAN) 5-1.5 MG/5ML syrup, Take 5 mLs by mouth every 8 (eight) hours as needed for cough., Disp: 120 mL, Rfl: 0  EXAM:  Filed Vitals:   12/25/14 1012  BP: 112/82  Pulse: 77  Temp: 97.7 F (36.5 C)    Body mass index is 39.23 kg/(m^2).  GENERAL: vitals reviewed and listed above, alert, oriented, appears well hydrated and in no acute distress  HEENT: atraumatic, conjunttiva clear, no obvious abnormalities on inspection of external nose and ears, normal appearance of ear canals, bilat scarring of TMs with bulging and effusion R and bullous lesion of membrane, clear effusion L ear, clear nasal congestion, mild post oropharyngeal erythema with PND, no tonsillar edema or exudate, no sinus TTP  NECK: no obvious masses on inspection  LUNGS: clear to auscultation bilaterally, no wheezes, rales or rhonchi, good air movement  CV: HRRR, no peripheral edema  MS: moves all extremities without noticeable abnormality  PSYCH: pleasant and cooperative, no obvious depression or anxiety  ASSESSMENT AND PLAN:  Discussed the following assessment and plan:  Acute upper respiratory infection  Bullous myringitis, right  -abx for ear issue with bullous myringitis and cough medication, follow up with ENT if ear issues persist -return precuations -Patient advised to return or notify a doctor immediately if symptoms worsen or persist or new concerns arise.  Patient Instructions  BEFORE YOU LEAVE: -lab  Take the antibiotic  Use the cough medication cautiously as advised  -As we discussed, we have prescribed a new medication for you at this appointment. We discussed the common and serious potential adverse effects of this medication and you can review these and more with the pharmacist when you pick up your  medication.  Please follow the instructions for use carefully and notify us immediately if you have any problems taking this medication.   Follow up with ENT if ear issues persist  Follow up yearly and as needed     Zidane Renner R.

## 2014-12-25 NOTE — Patient Instructions (Signed)
BEFORE YOU LEAVE: -lab  Take the antibiotic  Use the cough medication cautiously as advised  -As we discussed, we have prescribed a new medication for you at this appointment. We discussed the common and serious potential adverse effects of this medication and you can review these and more with the pharmacist when you pick up your medication.  Please follow the instructions for use carefully and notify us immediately if you have any problems taking this medication.   Follow up with ENT if ear issues persist  Follow up yearly and as needed

## 2014-12-25 NOTE — Addendum Note (Signed)
Addended by: Precious Gilding on: 12/25/2014 10:46 AM   Modules accepted: Orders

## 2014-12-25 NOTE — Progress Notes (Signed)
Pre visit review using our clinic review tool, if applicable. No additional management support is needed unless otherwise documented below in the visit note. 

## 2014-12-26 LAB — HIV ANTIBODY (ROUTINE TESTING W REFLEX): HIV: NONREACTIVE

## 2015-03-23 ENCOUNTER — Telehealth: Payer: Self-pay | Admitting: Family Medicine

## 2015-03-23 NOTE — Telephone Encounter (Signed)
Laura [name] called saying she has a bad cold and cough and is wondering if Dr. Maudie Mercury will send a Rx for cough medicine. She went to Urgent Care on Wed but they suggested she use OTC medication. I informed her she'd probably have to come in but she wanted me to ask anyway. Please call the pt.  Pt's ph# [phone] Thank you.

## 2015-03-26 ENCOUNTER — Ambulatory Visit (INDEPENDENT_AMBULATORY_CARE_PROVIDER_SITE_OTHER): Payer: Managed Care, Other (non HMO) | Admitting: Family

## 2015-03-26 ENCOUNTER — Encounter: Payer: Self-pay | Admitting: Family

## 2015-03-26 VITALS — BP 122/94 | HR 89 | Temp 98.2°F | Resp 16 | Ht 65.5 in | Wt 238.0 lb

## 2015-03-26 DIAGNOSIS — J019 Acute sinusitis, unspecified: Secondary | ICD-10-CM | POA: Insufficient documentation

## 2015-03-26 DIAGNOSIS — J0100 Acute maxillary sinusitis, unspecified: Secondary | ICD-10-CM | POA: Diagnosis not present

## 2015-03-26 DIAGNOSIS — J329 Chronic sinusitis, unspecified: Secondary | ICD-10-CM | POA: Insufficient documentation

## 2015-03-26 MED ORDER — HYDROCOD POLST-CPM POLST ER 10-8 MG/5ML PO SUER: 5.0000 mL | Freq: Every evening | ORAL | Status: DC | PRN

## 2015-03-26 MED ORDER — LEVOFLOXACIN 500 MG PO TABS: 500.0000 mg | ORAL_TABLET | Freq: Every day | ORAL | Status: DC

## 2015-03-26 NOTE — Progress Notes (Signed)
   Subjective:    Patient ID: Laura [name], female    DOB: 07-Jun-1985, 29 y.o.   MRN: [id number]  Chief Complaint  Patient presents with  . Cough    since last tuesday, started with sore throat and cough, thursday started getting congested runny nose, sneezing, ear pain, chest and back pain from coughing, coughed up reddish brown phlem last night    HPI:  Laura [name] is a 29 y.o. female who  has a past medical history of Arthritis; Depression; Rheumatoid arthritis ([place]); Frequent headaches; History of chicken pox; and UTI (lower urinary tract infection). and presents today for an acute office visit.   This is a new problem. Associated symptoms of sore throat, cough runny nose, sneezing, ear pain, and chest pain related to coughing has been going on for about 1 week. Notes that last evening there was productive sputum that was reddish brown. Denies fevers. Modifying factors include OTC Delsym, robutussin, Aleve, and Mucinex which manage the symptoms but does not help the course. Timing of the symptoms are worse at night. Recent azithromycin course in September.   Allergies  Allergen Reactions  . Amoxicillin Rash     Current Outpatient Prescriptions on File Prior to Visit  Medication Sig Dispense Refill  . escitalopram (LEXAPRO) 10 MG tablet Take by mouth daily.     Marland Kitchen ZENCHENT FE 0.4-35 MG-MCG tablet Chew by mouth daily.      No current facility-administered medications on file prior to visit.    Review of Systems  Constitutional: Negative for fever and chills.  HENT: Positive for congestion, ear pain, rhinorrhea, sinus pressure, sneezing and sore throat.   Respiratory: Positive for cough.   Psychiatric/Behavioral: Positive for sleep disturbance.      Objective:    BP 122/94 mmHg  Pulse 89  Temp(Src) 98.2 F (36.8 C) (Oral)  Resp 16  Ht 5' 5.5" (1.664 m)  Wt 238 lb (107.956 kg)  BMI 38.99 kg/m2  SpO2 98% Nursing note and vital signs reviewed.  Physical  Exam  Constitutional: She is oriented to person, place, and time. She appears well-developed and well-nourished. No distress.  HENT:  Right Ear: Hearing, tympanic membrane, external ear and ear canal normal.  Left Ear: Hearing, tympanic membrane, external ear and ear canal normal.  Nose: Right sinus exhibits maxillary sinus tenderness. Right sinus exhibits no frontal sinus tenderness. Left sinus exhibits maxillary sinus tenderness. Left sinus exhibits no frontal sinus tenderness.  Mouth/Throat: Uvula is midline, oropharynx is clear and moist and mucous membranes are normal.  Neck: Neck supple.  Cardiovascular: Normal rate, regular rhythm, normal heart sounds and intact distal pulses.   Pulmonary/Chest: Effort normal and breath sounds normal.  Neurological: She is alert and oriented to person, place, and time.  Skin: Skin is warm and dry.  Psychiatric: She has a normal mood and affect. Her behavior is normal. Judgment and thought content normal.       Assessment & Plan:   Problem List Items Addressed This Visit      Respiratory   Sinusitis, acute - Primary    Symptoms and exam consistent with bacterial sinusitis. Start levofloxacin. Start Tussionex as needed for cough and sleep. Continue over-the-counter medications as needed for symptom relief and supportive care. Follow-up if symptoms worsen or fail to improve.      Relevant Medications   chlorpheniramine-HYDROcodone (TUSSIONEX PENNKINETIC ER) 10-8 MG/5ML SUER   levofloxacin (LEVAQUIN) 500 MG tablet

## 2015-03-26 NOTE — Progress Notes (Signed)
Pre visit review using our clinic review tool, if applicable. No additional management support is needed unless otherwise documented below in the visit note. 

## 2015-03-26 NOTE — Patient Instructions (Signed)
Thank you for choosing [facility].  Summary/Instructions:  Your prescription(s) have been submitted to your pharmacy or been printed and provided for you. Please take as directed and contact our office if you believe you are having problem(s) with the medication(s) or have any questions.  If your symptoms worsen or fail to improve, please contact our office for further instruction, or in case of emergency go directly to the emergency room at the closest medical facility.   General Recommendations:    Please drink plenty of fluids.  Get plenty of rest   Sleep in humidified air  Use saline nasal sprays  Netti pot   OTC Medications:  Decongestants - helps relieve congestion   Flonase (generic fluticasone) or Nasacort (generic triamcinolone) - please make sure to use the "cross-over" technique at a 45 degree angle towards the opposite eye as opposed to straight up the nasal passageway.   Sudafed (generic pseudoephedrine - Note this is the one that is available behind the pharmacy counter); Products with phenylephrine (-PE) may also be used but is often not as effective as pseudoephedrine.   If you have HIGH BLOOD PRESSURE - Coricidin HBP; AVOID any product that is -D as this contains pseudoephedrine which may increase your blood pressure.  Afrin (oxymetazoline) every 6-8 hours for up to 3 days.   Allergies - helps relieve runny nose, itchy eyes and sneezing   Claritin (generic loratidine), Allegra (fexofenidine), or Zyrtec (generic cyrterizine) for runny nose. These medications should not cause drowsiness.  Note - Benadryl (generic diphenhydramine) may be used however may cause drowsiness  Cough -   Delsym or Robitussin (generic dextromethorphan)  Expectorants - helps loosen mucus to ease removal   Mucinex (generic guaifenesin) as directed on the package.  Headaches / General Aches   Tylenol (generic acetaminophen) - DO NOT EXCEED 3 grams (3,000 mg) in a 24  hour time period  Advil/Motrin (generic ibuprofen)   Sore Throat -   Salt water gargle   Chloraseptic (generic benzocaine) spray or lozenges / Sucrets (generic dyclonine)    Sinusitis Sinusitis is redness, soreness, and inflammation of the paranasal sinuses. Paranasal sinuses are air pockets within the bones of your face (beneath the eyes, the middle of the forehead, or above the eyes). In healthy paranasal sinuses, mucus is able to drain out, and air is able to circulate through them by way of your nose. However, when your paranasal sinuses are inflamed, mucus and air can become trapped. This can allow bacteria and other germs to grow and cause infection. Sinusitis can develop quickly and last only a short time (acute) or continue over a long period (chronic). Sinusitis that lasts for more than 12 weeks is considered chronic.  CAUSES  Causes of sinusitis include:  Allergies.  Structural abnormalities, such as displacement of the cartilage that separates your nostrils (deviated septum), which can decrease the air flow through your nose and sinuses and affect sinus drainage.  Functional abnormalities, such as when the small hairs (cilia) that line your sinuses and help remove mucus do not work properly or are not present. SIGNS AND SYMPTOMS  Symptoms of acute and chronic sinusitis are the same. The primary symptoms are pain and pressure around the affected sinuses. Other symptoms include:  Upper toothache.  Earache.  Headache.  Bad breath.  Decreased sense of smell and taste.  A cough, which worsens when you are lying flat.  Fatigue.  Fever.  Thick drainage from your nose, which often is green and may   contain pus (purulent).  Swelling and warmth over the affected sinuses. DIAGNOSIS  Your health care provider will perform a physical exam. During the exam, your health care provider may:  Look in your nose for signs of abnormal growths in your nostrils (nasal  polyps).  Tap over the affected sinus to check for signs of infection.  View the inside of your sinuses (endoscopy) using an imaging device that has a light attached (endoscope). If your health care provider suspects that you have chronic sinusitis, one or more of the following tests may be recommended:  Allergy tests.  Nasal culture. A sample of mucus is taken from your nose, sent to a lab, and screened for bacteria.  Nasal cytology. A sample of mucus is taken from your nose and examined by your health care provider to determine if your sinusitis is related to an allergy. TREATMENT  Most cases of acute sinusitis are related to a viral infection and will resolve on their own within 10 days. Sometimes medicines are prescribed to help relieve symptoms (pain medicine, decongestants, nasal steroid sprays, or saline sprays).  However, for sinusitis related to a bacterial infection, your health care provider will prescribe antibiotic medicines. These are medicines that will help kill the bacteria causing the infection.  Rarely, sinusitis is caused by a fungal infection. In theses cases, your health care provider will prescribe antifungal medicine. For some cases of chronic sinusitis, surgery is needed. Generally, these are cases in which sinusitis recurs more than 3 times per year, despite other treatments. HOME CARE INSTRUCTIONS   Drink plenty of water. Water helps thin the mucus so your sinuses can drain more easily.  Use a humidifier.  Inhale steam 3 to 4 times a day (for example, sit in the bathroom with the shower running).  Apply a warm, moist washcloth to your face 3 to 4 times a day, or as directed by your health care provider.  Use saline nasal sprays to help moisten and clean your sinuses.  Take medicines only as directed by your health care provider.  If you were prescribed either an antibiotic or antifungal medicine, finish it all even if you start to feel better. SEEK IMMEDIATE  MEDICAL CARE IF:  You have increasing pain or severe headaches.  You have nausea, vomiting, or drowsiness.  You have swelling around your face.  You have vision problems.  You have a stiff neck.  You have difficulty breathing. MAKE SURE YOU:   Understand these instructions.  Will watch your condition.  Will get help right away if you are not doing well or get worse. Document Released: 03/31/2005 Document Revised: 08/15/2013 Document Reviewed: 04/15/2011 Exit[employer] Patient Information 2015 [employer]. This information is not intended to replace advice given to you by your health care provider. Make sure you discuss any questions you have with your health care provider.   

## 2015-03-26 NOTE — Assessment & Plan Note (Signed)
Symptoms and exam consistent with bacterial sinusitis. Start levofloxacin. Start Tussionex as needed for cough and sleep. Continue over-the-counter medications as needed for symptom relief and supportive care. Follow-up if symptoms worsen or fail to improve.

## 2015-03-27 NOTE — Telephone Encounter (Signed)
Unable to leave a message as there is no voicemail set up.

## 2015-03-27 NOTE — Telephone Encounter (Signed)
Please advise her there are OTC options for cough if this is a mild cold - but if worsening or not improving or feels needs Rx strength medication please help her schedule an appointment. Thank you.

## 2015-04-18 ENCOUNTER — Encounter: Payer: Self-pay | Admitting: Gastroenterology

## 2015-06-12 ENCOUNTER — Ambulatory Visit (INDEPENDENT_AMBULATORY_CARE_PROVIDER_SITE_OTHER): Payer: Managed Care, Other (non HMO) | Admitting: Gastroenterology

## 2015-06-12 ENCOUNTER — Encounter: Payer: Self-pay | Admitting: Gastroenterology

## 2015-06-12 VITALS — BP 120/80 | HR 80 | Ht 66.25 in | Wt 240.0 lb

## 2015-06-12 DIAGNOSIS — R197 Diarrhea, unspecified: Secondary | ICD-10-CM

## 2015-06-12 DIAGNOSIS — M1230 Palindromic rheumatism, unspecified site: Secondary | ICD-10-CM

## 2015-06-12 DIAGNOSIS — R110 Nausea: Secondary | ICD-10-CM

## 2015-06-12 MED ORDER — ONDANSETRON HCL 4 MG PO TABS: 4.0000 mg | ORAL_TABLET | Freq: Three times a day (TID) | ORAL | Status: DC | PRN

## 2015-06-12 MED ORDER — NA SULFATE-K SULFATE-MG SULF 17.5-3.13-1.6 GM/177ML PO SOLN: 1.0000 | Freq: Once | ORAL | Status: DC

## 2015-06-12 MED ORDER — DICYCLOMINE HCL 10 MG PO CAPS: 10.0000 mg | ORAL_CAPSULE | Freq: Three times a day (TID) | ORAL | Status: DC

## 2015-06-12 NOTE — Progress Notes (Signed)
HPI :  30 y/o female seen in consultation for multiple bowel symptoms, first time visit to our clinic.  She feels nauseated quickly after eating, which can be associated with vomiting and diarrhea. This can occur quite quickly during a meal or after a meal. She has significant abdominal cramping with loose stools, and she is in the bathroom shortly after eating most meals. She reports when this first occurred it would be sporadic and intermittent. In 2014 she started having more frequent symptoms, and she now has courses of symptoms which last for several weeks and in between episodes has short periods of time which she does not have much symptoms. She thinks peptobismol can help with the cramping. She reports in general these symptoms have been ongoing for "years" since she was in middle school.   She has significant nausea after she eats, with rare vomiting, which can help relieve her symptoms. She is having anywhere from 1-4 BMs per day. When she is feeling well she has roughly one BM per day. She has not seen any blood in her stools during this time. When she has a bowel movement she is not sure if it helps her symptoms. She denies much gas and bloating. She thinks she has had stool samples in the past for infection which have been negative. She brings labs from 2014 when she had severe symptoms showing WBC of 14.5, normal HGb, ESR of 31, and positive lactoferrin. She has never had a colonoscopy.  She denies any FH of UC or CD, but has a family history of RA. She thinks she has weight gain since being on OCP, no weight loss.   She has been told over time she has "IBS" and the patient states she does not accept this diagnosis, and thinks this interferes with her quality of life and wants to make sure nothing else is going on. 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 She takes naproxen PRN, once or twice per week. She has  been seen by rheumatology who recommended an endoscopic evaluation given her bowel symptoms to ensure no evidence of IBD causing a related inflammatory arthropathy.    Past Medical History  Diagnosis Date  . Arthritis   . Depression   . Rheumatoid arthritis ([place])   . Frequent headaches   . History of chicken pox   . UTI (lower urinary tract infection)   h/o "palindromic rheumatism"   Past Surgical History  Procedure Laterality Date  . Tonsilectomy, adenoidectomy, bilateral myringotomy and tubes    . Hymenectomy  2000  . Appendectomy  2012   Family History  Problem Relation Age of Onset  . Arthritis Paternal Grandmother    Social History  Substance Use Topics  . Smoking status: Never Smoker   . Smokeless tobacco: Not on file  . Alcohol Use: 0.0 oz/week    0 Standard drinks or equivalent per week     Comment: occasionally    Current Outpatient Prescriptions  Medication Sig Dispense Refill  . escitalopram (LEXAPRO) 10 MG tablet Take by mouth daily.     Marland Kitchen ZENCHENT FE 0.4-35 MG-MCG tablet Chew by mouth daily.      No current facility-administered medications for this visit.   Allergies  Allergen Reactions  . Amoxicillin Rash     Review of Systems: All systems reviewed and negative except where noted in HPI.    No results found.   Lab results  per HPI as above  Physical Exam: BP 120/80 mmHg  Pulse 80  Ht 5' 6.25" (1.683 m)  Wt 240 lb (108.863 kg)  BMI 38.43 kg/m2 Constitutional: Pleasant,well-developed, female in no acute distress. HEENT: Normocephalic and atraumatic. Conjunctivae are normal. No scleral icterus. Neck supple.  Cardiovascular: Normal rate, regular rhythm.  Pulmonary/chest: Effort normal and breath sounds normal. No wheezing, rales or rhonchi. Abdominal: Soft, protuberant, nondistended, nontender. Bowel sounds active throughout. There are no masses palpable. No hepatomegaly. Extremities: no edema Lymphadenopathy: No cervical adenopathy  noted. Neurological: Alert and oriented to person place and time. Skin: Skin is warm and dry. No rashes noted. Psychiatric: Normal mood and affect. Behavior is normal.   ASSESSMENT AND PLAN: 30 y/o female who carries a diagnosis of "palindromic Rheumatism", here for evaluation of chronic bowel changes to include postprandial nausea / vomiting, and increased urgency of loose stools. Symptoms have been chronic since childhood, although worsening recently. She provided labs from an outside provider when she was seen for flare in bowel symptoms showing a mild leukocytosis with elevated ESR, along with (+) lactoferrin, done a few years ago. She endorses prior celiac testing (antibodies) which were negative.  Based on her chronicity of symptoms over the years, it is possible she has IBS driving these bowel symptoms, although her prior labs and history of inflammatory arthropathy do raise the question if she has underlying IBD. She is not satisfied with a diagnosis of IBS and wishes to have further evaluation. I offered her an EGD and colonoscopy in this light and discussed the risks / benefits of this along with the risks of sedation. Following this discussion she wished to proceed. I will otherwise obtain CBC, ESR, and CRP given she has not had this checked in a few years. In the interim, I will give her a trial of zofran for her nausea, and bentyl to use PRN for cramps, to see if this helps. Further recommendations pending results of her exams. She agreed.   Newark Cellar, MD [facility] Pager [phone]

## 2015-06-12 NOTE — Patient Instructions (Signed)
Your physician has requested that you go to the basement for the  lab work before leaving today  We have sent the following medications to your pharmacy for you to pick up at your convenience:  Bentyl, Zofran  You have been scheduled for an endoscopy and colonoscopy. Please follow the written instructions given to you at your visit today. Please pick up your prep supplies at the pharmacy within the next 1-3 days. If you use inhalers (even only as needed), please bring them with you on the day of your procedure. Your physician has requested that you go to www.[email or web address].com and enter the access code given to you at your visit today. This web site gives a general overview about your procedure. However, you should still follow specific instructions given to you by our office regarding your preparation for the procedure.

## 2015-06-21 ENCOUNTER — Other Ambulatory Visit: Payer: Self-pay | Admitting: Gastroenterology

## 2015-06-22 LAB — CBC WITH DIFFERENTIAL/PLATELET
BASOS ABS: 0 10*3/uL (ref 0.0–0.2)
Basos: 0 %
EOS (ABSOLUTE): 0.3 10*3/uL (ref 0.0–0.4)
Eos: 2 %
Hematocrit: 43.3 % (ref 34.0–46.6)
Hemoglobin: 14.1 g/dL (ref 11.1–15.9)
IMMATURE GRANS (ABS): 0.1 10*3/uL (ref 0.0–0.1)
IMMATURE GRANULOCYTES: 1 %
LYMPHS: 21 %
Lymphocytes Absolute: 2.6 10*3/uL (ref 0.7–3.1)
MCH: 27.9 pg (ref 26.6–33.0)
MCHC: 32.6 g/dL (ref 31.5–35.7)
MCV: 86 fL (ref 79–97)
MONOS ABS: 0.6 10*3/uL (ref 0.1–0.9)
Monocytes: 5 %
NEUTROS PCT: 71 %
Neutrophils Absolute: 8.9 10*3/uL — ABNORMAL HIGH (ref 1.4–7.0)
PLATELETS: 315 10*3/uL (ref 150–379)
RBC: 5.05 x10E6/uL (ref 3.77–5.28)
RDW: 13.5 % (ref 12.3–15.4)
WBC: 12.4 10*3/uL — AB (ref 3.4–10.8)

## 2015-06-22 LAB — C-REACTIVE PROTEIN: CRP: 19.8 mg/L — ABNORMAL HIGH (ref 0.0–4.9)

## 2015-06-22 LAB — SEDIMENTATION RATE: SED RATE: 30 mm/h (ref 0–32)

## 2015-06-26 ENCOUNTER — Ambulatory Visit (AMBULATORY_SURGERY_CENTER): Payer: Managed Care, Other (non HMO) | Admitting: Gastroenterology

## 2015-06-26 ENCOUNTER — Encounter: Payer: Self-pay | Admitting: Gastroenterology

## 2015-06-26 VITALS — BP 127/88 | HR 76 | Temp 98.6°F | Resp 17 | Ht 66.0 in | Wt 240.0 lb

## 2015-06-26 DIAGNOSIS — D122 Benign neoplasm of ascending colon: Secondary | ICD-10-CM

## 2015-06-26 DIAGNOSIS — R197 Diarrhea, unspecified: Secondary | ICD-10-CM

## 2015-06-26 DIAGNOSIS — K621 Rectal polyp: Secondary | ICD-10-CM

## 2015-06-26 DIAGNOSIS — K629 Disease of anus and rectum, unspecified: Secondary | ICD-10-CM

## 2015-06-26 DIAGNOSIS — K2980 Duodenitis without bleeding: Secondary | ICD-10-CM

## 2015-06-26 DIAGNOSIS — K317 Polyp of stomach and duodenum: Secondary | ICD-10-CM | POA: Diagnosis not present

## 2015-06-26 DIAGNOSIS — R110 Nausea: Secondary | ICD-10-CM

## 2015-06-26 DIAGNOSIS — K6289 Other specified diseases of anus and rectum: Secondary | ICD-10-CM

## 2015-06-26 MED ORDER — SODIUM CHLORIDE 0.9 % IV SOLN: 500.0000 mL | INTRAVENOUS | Status: DC

## 2015-06-26 NOTE — Progress Notes (Signed)
Called to room to assist during endoscopic procedure.  Patient ID and intended procedure confirmed with present staff. Received instructions for my participation in the procedure from the performing physician.  

## 2015-06-26 NOTE — Op Note (Signed)
[facility] Patient Name: Laura [name] Procedure Date: 06/26/2015 2:48 PM MRN: [id number] Endoscopist: Remo Lipps P. Havery Moros , MD Age: 30 Referring MD:  Date of Birth: 1985/06/17 Gender: Female Procedure:                Upper GI endoscopy Indications:              Diarrhea, Nausea Medicines:                Monitored Anesthesia Care Procedure:                Pre-Anesthesia Assessment:                           - Prior to the procedure, a History and Physical                            was performed, and patient medications and                            allergies were reviewed. The patient's tolerance of                            previous anesthesia was also reviewed. The risks                            and benefits of the procedure and the sedation                            options and risks were discussed with the patient.                            All questions were answered, and informed consent                            was obtained. Prior Anticoagulants: The patient has                            taken no previous anticoagulant or antiplatelet                            agents. ASA Grade Assessment: II - A patient with                            mild systemic disease. After reviewing the risks                            and benefits, the patient was deemed in                            satisfactory condition to undergo the procedure.                           After obtaining informed consent, the endoscope was  passed under direct vision. Throughout the                            procedure, the patient's blood pressure, pulse, and                            oxygen saturations were monitored continuously. The                            Model GIF-HQ190 [id number]) scope was introduced                            through the mouth, and advanced to the second part                            of duodenum. The upper GI endoscopy was                      accomplished without difficulty. The patient                            tolerated the procedure well. Scope In: Scope Out: Findings:      Esophagogastric landmarks were identified: the Z-line was found at 35       cm, the gastroesophageal junction was found at 35 cm and the site of       hiatal narrowing was found at 37 cm from the incisors.      A 2 cm hiatal hernia was present.      The esophagus was otherwise normal.      A single diminutive sessile polyp was found in the gastric fundus. The       polyp was removed with a cold biopsy forceps. Resection and retrieval       were complete.      The exam of the stomach was otherwise normal.      Biopsies were taken with a cold forceps in the gastric body and in the       gastric antrum for Helicobacter pylori testing.      Patchy mildly erythematous mucosa was found in the duodenal bulb.       Biopsies for histology were taken with a cold forceps for evaluation of       celiac disease.      The second portion of the duodenum was normal. Biopsies for histology       were taken with a cold forceps for evaluation of celiac disease. Complications:            No immediate complications. Estimated blood loss:                            Minimal. Estimated Blood Loss:     Estimated blood loss was minimal. Impression:               - Esophagogastric landmarks identified.                           - 2 cm hiatal hernia.                           -  Normal esophagus.                           - A single gastric polyp. Resected and retrieved.                           - Erythematous duodenopathy. Biopsied.                           - Normal second portion of the duodenum. Biopsied.                           - Biopsies were taken with a cold forceps for                            Helicobacter pylori testing. Recommendation:           - Patient has a contact number available for                            emergencies. The signs and  symptoms of potential                            delayed complications were discussed with the                            patient. Return to normal activities tomorrow.                            Written discharge instructions were provided to the                            patient.                           - Resume previous diet.                           - Continue present medications.                           - Await pathology results.                           - No repeat upper endoscopy. Procedure Code(s):        --- Professional ---                           [phone], Esophagogastroduodenoscopy, flexible,                            transoral; with biopsy, single or multiple CPT copyright 2016 American Medical Association. All rights reserved. Remo Lipps P. Havery Moros, MD [name]. [name], MD 06/26/2015 3:37:32 PM This report has been signed electronically. Number of Addenda: 0

## 2015-06-26 NOTE — Op Note (Signed)
[facility] Patient Name: Laura [name] Procedure Date: 06/26/2015 2:57 PM MRN: [id number] Endoscopist: Remo Lipps P. Havery Moros , MD Age: 30 Referring MD:  Date of Birth: July 19, 1985 Gender: Female Procedure:                Colonoscopy Indications:              Change in bowel habits, history of palindromic                            rheumatism, rule out IBD Medicines:                Monitored Anesthesia Care Procedure:                Pre-Anesthesia Assessment:                           - Prior to the procedure, a History and Physical                            was performed, and patient medications and                            allergies were reviewed. The patient's tolerance of                            previous anesthesia was also reviewed. The risks                            and benefits of the procedure and the sedation                            options and risks were discussed with the patient.                            All questions were answered, and informed consent                            was obtained. Prior Anticoagulants: The patient has                            taken no previous anticoagulant or antiplatelet                            agents. ASA Grade Assessment: II - A patient with                            mild systemic disease. After reviewing the risks                            and benefits, the patient was deemed in                            satisfactory condition to undergo the procedure.  After obtaining informed consent, the colonoscope                            was passed under direct vision. Throughout the                            procedure, the patient's blood pressure, pulse, and                            oxygen saturations were monitored continuously. The                            Model PCF-H190L [id number]) scope was introduced                            through the anus and advanced to the the terminal                             ileum, with identification of the appendiceal                            orifice and IC valve. The quality of the bowel                            preparation was good. The terminal ileum, ileocecal                            valve, appendiceal orifice, and rectum were                            photographed. Scope In: 2:59:16 PM Scope Out: 3:24:30 PM Scope Withdrawal Time: 0 hours 19 minutes 46 seconds  Total Procedure Duration: 0 hours 25 minutes 14 seconds  Findings:      The perianal and digital rectal examinations were normal.      A 5 mm polyp was found in the ascending colon. The polyp was sessile.       The polyp was removed with a cold snare. Resection and retrieval were       complete.      The colon (entire examined portion) revealed excessive looping. This led       to a technically challenging ileal intubation which was eventually       successful.      The mucosa of the rectum and in the recto-sigmoid colon that had diffuse       mild polypoid appearance to it. I suspect this represents benign       lymphoid aggregates however biopsies were taken with a cold forceps for       histology.      The terminal ileum appeared normal.      The exam was otherwise without abnormality on direct and retroflexion       views. No evidence of inflammatory changes or overt evidence of IBD.      Biopsies for histology were taken with a cold forceps from the right       colon and left colon for evaluation of microscopic colitis. Complications:  No immediate complications. Estimated blood loss:                            Minimal. Estimated Blood Loss:     Estimated blood loss was minimal. Impression:               - One 5 mm polyp in the ascending colon, removed                            with a cold snare. Resected and retrieved.                           - There was significant looping of the colon.                           - Polypoid mucosa in the  rectum and in the                            recto-sigmoid colon, likely benign lymphoid                            aggregates. Biopsied.                           - The examined portion of the ileum was normal.                           - The examination was otherwise normal on direct                            and retroflexion views.                           - Biopsies were taken with a cold forceps from the                            right colon and left colon for evaluation of                            microscopic colitis. Recommendation:           - Patient has a contact number available for                            emergencies. The signs and symptoms of potential                            delayed complications were discussed with the                            patient. Return to normal activities tomorrow.                            Written discharge instructions were provided to the  patient.                           - Resume previous diet.                           - Continue present medications.                           - Await pathology results.                           - Repeat colonoscopy is recommended. The                            colonoscopy date will be determined after pathology                            results from today's exam become available for                            review. Procedure Code(s):        --- Professional ---                           [phone], Colonoscopy, flexible; with removal of                            tumor(s), polyp(s), or other lesion(s) by snare                            technique                           [id number], 59, Colonoscopy, flexible; with biopsy,                            single or multiple CPT copyright 2016 American Medical Association. All rights reserved. Remo Lipps P. Havery Moros, MD [name]. [place], MD 06/26/2015 3:32:41 PM This report has been signed electronically. Number of Addenda:  0

## 2015-06-26 NOTE — Patient Instructions (Signed)
Discharge instructions given. Handout on polyps. Biopsies taken. YOU HAD AN ENDOSCOPIC PROCEDURE TODAY AT [facility]:   Refer to the procedure report that was given to you for any specific questions about what was found during the examination.  If the procedure report does not answer your questions, please call your gastroenterologist to clarify.  If you requested that your care partner not be given the details of your procedure findings, then the procedure report has been included in a sealed envelope for you to review at your convenience later.  YOU SHOULD EXPECT: Some feelings of bloating in the abdomen. Passage of more gas than usual.  Walking can help get rid of the air that was put into your GI tract during the procedure and reduce the bloating. If you had a lower endoscopy (such as a colonoscopy or flexible sigmoidoscopy) you may notice spotting of blood in your stool or on the toilet paper. If you underwent a bowel prep for your procedure, you may not have a normal bowel movement for a few days.  Please Note:  You might notice some irritation and congestion in your nose or some drainage.  This is from the oxygen used during your procedure.  There is no need for concern and it should clear up in a day or so.  SYMPTOMS TO REPORT IMMEDIATELY:   Following lower endoscopy (colonoscopy or flexible sigmoidoscopy):  Excessive amounts of blood in the stool  Significant tenderness or worsening of abdominal pains  Swelling of the abdomen that is new, acute  Fever of 100F or higher   Following upper endoscopy (EGD)  Vomiting of blood or coffee ground material  New chest pain or pain under the shoulder blades  Painful or persistently difficult swallowing  New shortness of breath  Fever of 100F or higher  Black, tarry-looking stools  For urgent or emergent issues, a gastroenterologist can be reached at any hour by calling [phone].   DIET: Your first meal following  the procedure should be a small meal and then it is ok to progress to your normal diet. Heavy or fried foods are harder to digest and may make you feel nauseous or bloated.  Likewise, meals heavy in dairy and vegetables can increase bloating.  Drink plenty of fluids but you should avoid alcoholic beverages for 24 hours.  ACTIVITY:  You should plan to take it easy for the rest of today and you should NOT DRIVE or use heavy machinery until tomorrow (because of the sedation medicines used during the test).    FOLLOW UP: Our staff will call the number listed on your records the next business day following your procedure to check on you and address any questions or concerns that you may have regarding the information given to you following your procedure. If we do not reach you, we will leave a message.  However, if you are feeling well and you are not experiencing any problems, there is no need to return our call.  We will assume that you have returned to your regular daily activities without incident.  If any biopsies were taken you will be contacted by phone or by letter within the next 1-3 weeks.  Please call us at [phone] if you have not heard about the biopsies in 3 weeks.    SIGNATURES/CONFIDENTIALITY: You and/or your care partner have signed paperwork which will be entered into your electronic medical record.  These signatures attest to the fact that that the information above  on your After Visit Summary has been reviewed and is understood.  Full responsibility of the confidentiality of this discharge information lies with you and/or your care-partner.

## 2015-06-26 NOTE — Progress Notes (Signed)
Report to PACU, RN, vss, BBS= Clear.  

## 2015-06-27 ENCOUNTER — Telehealth: Payer: Self-pay

## 2015-06-27 ENCOUNTER — Encounter: Payer: Self-pay | Admitting: *Deleted

## 2015-06-27 NOTE — Telephone Encounter (Signed)
No answer or vm to leave message. 

## 2016-02-12 ENCOUNTER — Other Ambulatory Visit: Payer: Self-pay | Admitting: Obstetrics & Gynecology

## 2016-02-12 DIAGNOSIS — R922 Inconclusive mammogram: Secondary | ICD-10-CM

## 2016-02-12 DIAGNOSIS — L989 Disorder of the skin and subcutaneous tissue, unspecified: Secondary | ICD-10-CM

## 2016-02-15 ENCOUNTER — Ambulatory Visit
Admission: RE | Admit: 2016-02-15 | Discharge: 2016-02-15 | Disposition: A | Discharge status: Home / Self Care | Payer: Managed Care, Other (non HMO) | Source: Ambulatory Visit | Attending: Obstetrics & Gynecology | Admitting: Obstetrics & Gynecology

## 2016-02-15 DIAGNOSIS — L989 Disorder of the skin and subcutaneous tissue, unspecified: Secondary | ICD-10-CM

## 2016-02-15 DIAGNOSIS — R922 Inconclusive mammogram: Secondary | ICD-10-CM

## 2016-05-02 ENCOUNTER — Ambulatory Visit (INDEPENDENT_AMBULATORY_CARE_PROVIDER_SITE_OTHER): Payer: Managed Care, Other (non HMO) | Admitting: Family Medicine

## 2016-05-02 ENCOUNTER — Encounter: Payer: Self-pay | Admitting: Family Medicine

## 2016-05-02 VITALS — BP 140/98 | HR 97 | Temp 98.3°F | Ht 66.0 in | Wt 257.2 lb

## 2016-05-02 DIAGNOSIS — H66002 Acute suppurative otitis media without spontaneous rupture of ear drum, left ear: Secondary | ICD-10-CM

## 2016-05-02 DIAGNOSIS — R6889 Other general symptoms and signs: Secondary | ICD-10-CM | POA: Diagnosis not present

## 2016-05-02 MED ORDER — AZITHROMYCIN 250 MG PO TABS: ORAL_TABLET | ORAL | 0 refills | Status: DC

## 2016-05-02 MED ORDER — PREDNISONE 20 MG PO TABS: 40.0000 mg | ORAL_TABLET | Freq: Every day | ORAL | 0 refills | Status: DC

## 2016-05-02 MED ORDER — HYDROCOD POLST-CPM POLST ER 10-8 MG/5ML PO SUER: 5.0000 mL | Freq: Two times a day (BID) | ORAL | 0 refills | Status: DC | PRN

## 2016-05-02 NOTE — Progress Notes (Signed)
Pre visit review using our clinic review tool, if applicable. No additional management support is needed unless otherwise documented below in the visit note. 

## 2016-05-02 NOTE — Patient Instructions (Addendum)
BEFORE YOU LEAVE: -follow up: in 2-4 weeks for blood pressure   Start the antiboitic right away.  Can use the cough medication at night - only use as instructed and use caution. See below and discuss use and risks with pharmacist.  Prednisone if needed.  See care immediately if worsening, new symptoms or not improving as expected.  You have decided to try a medication that is a controlled substance for treatment of your medical problem. While this medication has benefits and can help you with your illness, as we discussed, it also has considerable risks. You have been properly informed of the risks and alternatives and re-iterate here that this medication can cause:  Altered Mental Capacity or Alertness: Do not drive or operate machinery while taking this medication  Dependence: A physiological state that can occur with regular drug use and results in withdrawal symptoms when drug use is abruptly discontinued. This can happen even with a short course of this medication.  Addiction: A chronic, relapsing disease characterized by compulsive drug-seeking and use despite negative consequences and by long-lasting changes in the brain.  Many, many more side effects including some that can be serious and/or life threatening. Please discuss these side effects with your pharmacist.  I advise: -that you use as little of this medication as possible and only as directed for as short of time as possible -keep this medication in a safe and locked location away from children or others -do not share this medication -dispose of any unused medication in a medication drop box -do not take this medication with other controlled or sedating substances, alcohol or drugs other then those approved by your doctor

## 2016-05-02 NOTE — Progress Notes (Signed)
HPI:  Acute visit for upper resp illness -started: 1 week ago -symptoms:green nasal congestion, sore throat, cough, vomiting and fevers and body aches initially, ear pain -denies:persistent fever, SOB, rash diarrhea, tooth pain, sinus pain -has tried: many over the counter cough and cold medications -sick contacts/travel/risks: no reported flu, strep or tick exposure  ROS: See pertinent positives and negatives per HPI.  Past Medical History:  Diagnosis Date  . Arthritis   . Depression   . Frequent headaches   . History of chicken pox   . Otitis media    had tubes as child, then according to pt adipose tissue used to repair ear drum  . Rheumatoid arthritis ([place])   . UTI (lower urinary tract infection)     Past Surgical History:  Procedure Laterality Date  . APPENDECTOMY  2012  . HYMENECTOMY  2000  . TONSILECTOMY, ADENOIDECTOMY, BILATERAL MYRINGOTOMY AND TUBES      Family History  Problem Relation Age of Onset  . Arthritis Paternal Grandmother   . Cancer Maternal Grandmother   . Cancer Maternal Grandfather   . Liver cancer Paternal Grandfather     Social History   Social History  . Marital status: Single    Spouse name: N/A  . Number of children: 0  . Years of education: N/A   Occupational History  . 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 Smoking status: Never Smoker  . Smokeless tobacco: Never Used  . Alcohol use 0.0 oz/week     Comment: occasionally   . Drug use: No  . Sexual activity: Yes    Birth control/ protection: Pill   Other Topics Concern  . 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  escitalopram (LEXAPRO) 5 MG tablet, Take 5 mg by mouth daily., Disp: , Rfl:  .  hydroxychloroquine (PLAQUENIL) 200 MG tablet,  Take by mouth daily., Disp: , Rfl:  .  NAPROXEN PO, Take by mouth as needed., Disp: , Rfl:  .  azithromycin (ZITHROMAX) 250 MG tablet, 2 tabs day 1, then one tab daily, Disp: 6 tablet, Rfl: 0 .  chlorpheniramine-HYDROcodone (TUSSIONEX PENNKINETIC ER) 10-8 MG/5ML SUER, Take 5 mLs by mouth every 12 (twelve) hours as needed for cough., Disp: 115 mL, Rfl: 0 .  predniSONE (DELTASONE) 20 MG tablet, Take 2 tablets (40 mg total) by mouth daily with breakfast., Disp: 8 tablet, Rfl: 0  EXAM:  Vitals:   05/02/16 1318  BP: (!) 140/98  Pulse: 97  Temp: 98.3 F (36.8 C)    Body mass index is 41.51 kg/m.  GENERAL: vitals reviewed and listed above, alert, oriented, appears well hydrated and in no acute distress  HEENT: atraumatic, conjunttiva clear, no obvious abnormalities on inspection of external nose and ears, normal appearance of ear canals and TMs except for discolored effusion L, thicknasal congestion, mild post oropharyngeal erythema with PND, no tonsillar edema or exudate, no sinus TTP  NECK: no obvious masses on inspection  LUNGS: clear to auscultation bilaterally, no wheezes, rales or rhonchi, good air movement  CV: HRRR, no peripheral edema  MS: moves all extremities without noticeable abnormality  PSYCH: pleasant and cooperative, no obvious  depression or anxiety  ASSESSMENT AND PLAN:  Discussed the following assessment and plan:  Flu-like symptoms  Acute suppurative otitis media of left ear without spontaneous rupture of tympanic membrane, recurrence not specified  -given HPI and exam findings today, a serious infection or illness is unlikely. We discussed potential etiologies, with VURI  Vs influenza with 2ndary otitis media and possible bronchitis. We discussed treatment side effects, likely course, potential complications and signs of developing a serious illness. She has pen allergy. Opted for zpack, prednisone for cough if needed if syrup not helping. -of course, we  advised to return or notify a doctor immediately if symptoms worsen or persist or new concerns arise.    Patient Instructions  BEFORE YOU LEAVE: -follow up: in 2-4 weeks for blood pressure   Start the antiboitic right away.  Can use the cough medication at night - only use as instructed and use caution. See below and discuss use and risks with pharmacist.  Prednisone if needed.  See care immediately if worsening, new symptoms or not improving as expected.  You have decided to try a medication that is a controlled substance for treatment of your medical problem. While this medication has benefits and can help you with your illness, as we discussed, it also has considerable risks. You have been properly informed of the risks and alternatives and re-iterate here that this medication can cause:  Altered Mental Capacity or Alertness: Do not drive or operate machinery while taking this medication  Dependence: A physiological state that can occur with regular drug use and results in withdrawal symptoms when drug use is abruptly discontinued. This can happen even with a short course of this medication.  Addiction: A chronic, relapsing disease characterized by compulsive drug-seeking and use despite negative consequences and by long-lasting changes in the brain.  Many, many more side effects including some that can be serious and/or life threatening. Please discuss these side effects with your pharmacist.  I advise: -that you use as little of this medication as possible and only as directed for as short of time as possible -keep this medication in a safe and locked location away from children or others -do not share this medication -dispose of any unused medication in a medication drop box -do not take this medication with other controlled or sedating substances, alcohol or drugs other then those approved by your doctor      Colin Benton R., DO

## 2016-05-29 ENCOUNTER — Ambulatory Visit: Payer: Managed Care, Other (non HMO) | Admitting: Family Medicine

## 2016-05-29 ENCOUNTER — Telehealth: Payer: Self-pay | Admitting: *Deleted

## 2016-05-29 NOTE — Telephone Encounter (Signed)
Patient was a no-show for today's appt.  I called the pt to check on her and she stated she cancelled the appt for today.

## 2016-06-19 ENCOUNTER — Encounter: Payer: Self-pay | Admitting: Family Medicine

## 2016-06-19 ENCOUNTER — Ambulatory Visit (INDEPENDENT_AMBULATORY_CARE_PROVIDER_SITE_OTHER): Payer: Managed Care, Other (non HMO) | Admitting: Family Medicine

## 2016-06-19 VITALS — BP 118/88 | HR 76 | Temp 98.3°F | Ht 66.0 in | Wt 251.1 lb

## 2016-06-19 DIAGNOSIS — Z6841 Body Mass Index (BMI) 40.0 and over, adult: Secondary | ICD-10-CM

## 2016-06-19 DIAGNOSIS — F330 Major depressive disorder, recurrent, mild: Secondary | ICD-10-CM

## 2016-06-19 DIAGNOSIS — E669 Obesity, unspecified: Secondary | ICD-10-CM | POA: Insufficient documentation

## 2016-06-19 NOTE — Patient Instructions (Signed)
BEFORE YOU LEAVE: -follow up: 3 months  Start counseling back up.  At least 150 minutes of aerobic exercise per week and a healthy Mediterranean diet. Stay away from sweets and simple starches - white starchy foods.   We recommend the following healthy lifestyle for LIFE: 1) Small portions.   Tip: eat off of a salad plate instead of a dinner plate.  Tip: It is ok to feel hungry after a meal of proper portion sizes.  Tip: if you need more or a snack choose fruits, veggies and/or a handful of nuts or seeds.  2) Eat a healthy clean diet.  * Tip: Avoid (less then 1 serving per week): processed foods, sweets, sweetened drinks, white starches (rice, flour, bread, potatoes, pasta, etc), red meat, fast foods, butter  *Tip: CHOOSE instead   * 5-9 servings per day of fresh or frozen fruits and vegetables (but not corn, potatoes, bananas, canned or dried fruit)   *nuts and seeds, beans   *olives and olive oil   *small portions of lean meats such as fish and white chicken    *small portions of whole grains  3)Get at least 150 minutes of sweaty aerobic exercise per week.  4)Reduce stress - consider counseling, meditation and relaxation to balance other aspects of your life.

## 2016-06-19 NOTE — Progress Notes (Signed)
Pre visit review using our clinic review tool, if applicable. No additional management support is needed unless otherwise documented below in the visit note. 

## 2016-06-19 NOTE — Progress Notes (Signed)
HPI:  Laura [name] is a very pleasant 31 year old here for an acute visit to discuss depression. She used to see a psychiatrist for this, but this was not covered by her insurance. She also was not happy with the care. She used to be on Lexapro and did not like the sexual side effects. She'll prefer to treat without medications if possible. She used to see a Social worker and this did help. She has not been getting counseling recently. She has mild symptoms including mild depressed mood and anhedonia 3-4 days per week chronically. No sleep or eating disorder. No anxiety. No SI, thoughts of self harm, severe symptoms, manic symptoms. No regular exercise but plans to start exercise class. Weakness in diet is sugar.  ROS: See pertinent positives and negatives per HPI.  Past Medical History:  Diagnosis Date  . Arthritis   . Depression   . Frequent headaches   . History of chicken pox   . Otitis media    had tubes as child, then according to pt adipose tissue used to repair ear drum  . Rheumatoid arthritis ([place])   . UTI (lower urinary tract infection)     Past Surgical History:  Procedure Laterality Date  . APPENDECTOMY  2012  . HYMENECTOMY  2000  . TONSILECTOMY, ADENOIDECTOMY, BILATERAL MYRINGOTOMY AND TUBES      Family History  Problem Relation Age of Onset  . Arthritis Paternal Grandmother   . Cancer Maternal Grandmother   . Cancer Maternal Grandfather   . Liver cancer Paternal Grandfather     Social History   Social History  . Marital status: Single    Spouse name: N/A  . Number of children: 0  . Years of education: N/A   Occupational History  . 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 Smoking status: Never Smoker  . Smokeless tobacco: Never Used  . Alcohol use 0.0 oz/week     Comment: occasionally   . Drug use: No  . Sexual activity: Yes    Birth control/ protection: Pill   Other Topics Concern  . 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  escitalopram (LEXAPRO) 5 MG tablet, Take 5 mg by mouth daily., Disp: , Rfl:  .  hydroxychloroquine (PLAQUENIL) 200 MG tablet, Take by mouth daily., Disp: , Rfl:  .  NAPROXEN PO, Take by mouth as needed., Disp: , Rfl:   EXAM:  Vitals:   06/19/16 1358  BP: 118/88  Pulse: 76  Temp: 98.3 F (36.8 C)    Body mass index is 40.53 kg/m.  GENERAL: vitals reviewed and listed above, alert, oriented, appears well hydrated and in no acute distress  HEENT: atraumatic, conjunttiva clear, no obvious abnormalities on inspection of external nose and ears  NECK: no obvious masses on inspection  LUNGS: clear to auscultation bilaterally, no wheezes, rales or rhonchi, good air movement  CV: HRRR, no peripheral edema  MS: moves all extremities without noticeable abnormality  PSYCH: pleasant and cooperative, no obvious depression or anxiety  ASSESSMENT AND PLAN:  Discussed the following assessment and plan: More than 50% of over 25 minutes spent in total in caring for this  patient was spent face-to-face with the patient, counseling and/or coordinating care.   Mild episode of recurrent major depressive disorder (HCC)  BMI 40.0-44.9, adult ([place])  -discussed treatment options -restart CBT, healthy diet, regular exercise -consider wellbutrin if not improving or if worsening -follow up 3 months -Patient advised to return or notify a doctor immediately if symptoms worsen or persist or new concerns arise.  Patient Instructions  BEFORE YOU LEAVE: -follow up: 3 months  Start counseling back up.  At least 150 minutes of aerobic exercise per week and a healthy Mediterranean diet. Stay away from sweets and simple starches - white starchy foods.   We recommend the following  healthy lifestyle for LIFE: 1) Small portions.   Tip: eat off of a salad plate instead of a dinner plate.  Tip: It is ok to feel hungry after a meal of proper portion sizes.  Tip: if you need more or a snack choose fruits, veggies and/or a handful of nuts or seeds.  2) Eat a healthy clean diet.  * Tip: Avoid (less then 1 serving per week): processed foods, sweets, sweetened drinks, white starches (rice, flour, bread, potatoes, pasta, etc), red meat, fast foods, butter  *Tip: CHOOSE instead   * 5-9 servings per day of fresh or frozen fruits and vegetables (but not corn, potatoes, bananas, canned or dried fruit)   *nuts and seeds, beans   *olives and olive oil   *small portions of lean meats such as fish and white chicken    *small portions of whole grains  3)Get at least 150 minutes of sweaty aerobic exercise per week.  4)Reduce stress - consider counseling, meditation and relaxation to balance other aspects of your life.      Colin Benton R., DO

## 2016-06-25 ENCOUNTER — Emergency Department
Admission: EM | Admit: 2016-06-25 | Discharge: 2016-06-25 | Disposition: A | Discharge status: Home / Self Care | Payer: Managed Care, Other (non HMO) | Attending: Emergency Medicine | Admitting: Emergency Medicine

## 2016-06-25 ENCOUNTER — Encounter: Payer: Self-pay | Admitting: Emergency Medicine

## 2016-06-25 DIAGNOSIS — F419 Anxiety disorder, unspecified: Secondary | ICD-10-CM | POA: Diagnosis not present

## 2016-06-25 DIAGNOSIS — Z79899 Other long term (current) drug therapy: Secondary | ICD-10-CM | POA: Insufficient documentation

## 2016-06-25 LAB — CBC
HEMATOCRIT: 38.4 % (ref 35.0–47.0)
Hemoglobin: 13.1 g/dL (ref 12.0–16.0)
MCH: 27.2 pg (ref 26.0–34.0)
MCHC: 34.2 g/dL (ref 32.0–36.0)
MCV: 79.5 fL — AB (ref 80.0–100.0)
PLATELETS: 256 10*3/uL (ref 150–440)
RBC: 4.83 MIL/uL (ref 3.80–5.20)
RDW: 14.8 % — ABNORMAL HIGH (ref 11.5–14.5)
WBC: 16.7 10*3/uL — AB (ref 3.6–11.0)

## 2016-06-25 LAB — ACETAMINOPHEN LEVEL: Acetaminophen (Tylenol), Serum: <10 ug/mL — ABNORMAL LOW (ref 10–30)

## 2016-06-25 LAB — URINE DRUG SCREEN, QUALITATIVE (ARMC ONLY)
Amphetamines, Ur Screen: NOT DETECTED
BARBITURATES, UR SCREEN: NOT DETECTED
BENZODIAZEPINE, UR SCRN: NOT DETECTED
CANNABINOID 50 NG, UR ~~LOC~~: NOT DETECTED
Cocaine Metabolite,Ur ~~LOC~~: NOT DETECTED
MDMA (Ecstasy)Ur Screen: NOT DETECTED
Methadone Scn, Ur: NOT DETECTED
OPIATE, UR SCREEN: NOT DETECTED
Phencyclidine (PCP) Ur S: NOT DETECTED
TRICYCLIC, UR SCREEN: NOT DETECTED

## 2016-06-25 LAB — BASIC METABOLIC PANEL
ANION GAP: 8 (ref 5–15)
BUN: 11 mg/dL (ref 6–20)
CALCIUM: 8.9 mg/dL (ref 8.9–10.3)
CO2: 24 mmol/L (ref 22–32)
CREATININE: 0.86 mg/dL (ref 0.44–1.00)
Chloride: 106 mmol/L (ref 101–111)
GFR calc Af Amer: >60 mL/min (ref >60)
GLUCOSE: 110 mg/dL — AB (ref 65–99)
Potassium: 3.2 mmol/L — ABNORMAL LOW (ref 3.5–5.1)
Sodium: 138 mmol/L (ref 135–145)

## 2016-06-25 LAB — TROPONIN I: Troponin I: 0.03 ng/mL (ref <0.03)

## 2016-06-25 LAB — ETHANOL: Alcohol, Ethyl (B): <5 mg/dL (ref <5)

## 2016-06-25 LAB — SALICYLATE LEVEL: Salicylate Lvl: <7 mg/dL (ref 2.8–30.0)

## 2016-06-25 MED ORDER — ESCITALOPRAM OXALATE 5 MG PO TABS: 5.0000 mg | ORAL_TABLET | Freq: Every day | ORAL | 0 refills | Status: DC

## 2016-06-25 MED ORDER — LORAZEPAM 1 MG PO TABS
1.0000 mg | ORAL_TABLET | Freq: Once | ORAL | Status: AC
  Administered 2016-06-25: 1 mg via ORAL
  Filled 2016-06-25: qty 1

## 2016-06-25 NOTE — Discharge Instructions (Signed)
Please return immediately if condition worsens. Please contact her primary physician or the physician you were given for referral. If you have any specialist physicians involved in her treatment and plan please also contact them. Thank you for using  regional emergency Department. ° °

## 2016-06-25 NOTE — ED Provider Notes (Signed)
-----------------------------------------   9:29 AM on 06/25/2016 -----------------------------------------   Blood pressure 122/85, pulse 92, temperature 97.9 F (36.6 C), temperature source Oral, resp. rate 18, height 5\' 6"  (1.676 m), weight 251 lb (113.9 kg), last menstrual period 05/28/2016, SpO2 91 %.  Assuming care from Dr. Dahlia Client.  In short, Laura [name] is a 31 y.o. female with a chief complaint of Anxiety .  Refer to the original H&P for additional details.  The current plan of care is to follow the patient's telemetry psychiatric consult recommendations. Patient states she feels symptomatically improved and as per recommendations will be discharged on Lexapro 5 mg daily. She is aware to return here if she becomes suicidal, homicidal, or has hallucinations.        Daymon Larsen, MD 06/25/16 0930

## 2016-06-25 NOTE — ED Notes (Signed)
This RN to bedside at this time to introduce self to patient/SO. Pt resting in bed, respirations even and unlabored, SO at bedside at this time. Pt denies any needs. Will continue to monitor for further patient needs at this time.

## 2016-06-25 NOTE — ED Notes (Signed)
NAD noted at time of D/C. Pt denies questions or concerns. Pt ambulatory to the lobby at this time.  

## 2016-06-25 NOTE — ED Provider Notes (Signed)
[facility] Emergency Department Provider Note   ____________________________________________   First MD Initiated Contact with Patient 06/25/16 0250     (approximate)  I have reviewed the triage vital signs and the nursing notes.   HISTORY  Chief Complaint Anxiety    HPI Laura [name] is a 31 y.o. female who comes into the hospital today feeling like she is having an anxiety attack. She's been having these symptoms all day. The patient reports that she feels heaviness in her chest and racing thoughts. She also has a restless sensation to the back of her head. She's been nauseous and feels that she is been no vomiting. The patient has never had these symptoms before. The patient's husband reports that when he gets really bad she starts rocking. She was taking Lexapro for depression but it was never supposed to be a long time and she's been off of it for about 3 months. She's never had an anxiety issues before this. The patient denies any triggers or new stressors causing the symptoms. She is here today for evaluation.   Past Medical History:  Diagnosis Date  . Arthritis   . Depression   . Frequent headaches   . History of chicken pox   . Otitis media    had tubes as child, then according to pt adipose tissue used to repair ear drum  . Rheumatoid arthritis ([place])   . UTI (lower urinary tract infection)     Patient Active Problem List   Diagnosis Date Noted  . BMI 40.0-44.9, adult ([place]) 06/19/2016  . Mild episode of recurrent major depressive disorder ([place]) 06/19/2016  . History of IBS 03/28/2014  . Alternating constipation and diarrhea 05/18/2012  . Rheumatoid arthritis([place]) 04/29/2012    Past Surgical History:  Procedure Laterality Date  . APPENDECTOMY  2012  . HYMENECTOMY  2000  . TONSILECTOMY, ADENOIDECTOMY, BILATERAL MYRINGOTOMY AND TUBES      Prior to Admission medications   Medication Sig Start Date End Date Taking?  Authorizing Provider  NAPROXEN PO Take by mouth as needed.   Yes Historical Provider, MD  escitalopram (LEXAPRO) 5 MG tablet Take 5 mg by mouth daily.    Historical Provider, MD  hydroxychloroquine (PLAQUENIL) 200 MG tablet Take by mouth daily.    Historical Provider, MD    Allergies Amoxicillin  Family History  Problem Relation Age of Onset  . Arthritis Paternal Grandmother   . Cancer Maternal Grandmother   . Cancer Maternal Grandfather   . Liver cancer Paternal Grandfather     Social History Social History  Substance Use Topics  . Smoking status: Never Smoker  . Smokeless tobacco: Never Used  . Alcohol use 0.0 oz/week     Comment: occasionally     Review of Systems Constitutional: No fever/chills Eyes: No visual changes. ENT: No sore throat. Cardiovascular: Denies chest pain. Respiratory: Denies shortness of breath. Gastrointestinal: No abdominal pain.  No nausea, no vomiting.  No diarrhea.  No constipation. Genitourinary: Negative for dysuria. Musculoskeletal: Negative for back pain. Skin: Negative for rash. Neurological: Negative for headaches, focal weakness or numbness. Psych: Anxiety  10-point ROS otherwise negative.  ____________________________________________   PHYSICAL EXAM:  VITAL SIGNS: ED Triage Vitals  Enc Vitals Group     BP 06/25/16 0017 (!) 157/87     Pulse Rate 06/25/16 0017 93     Resp 06/25/16 0017 20     Temp 06/25/16 0017 97.9 F (36.6 C)     Temp Source 06/25/16  0017 Oral     SpO2 06/25/16 0017 97 %     Weight 06/25/16 0015 251 lb (113.9 kg)     Height 06/25/16 0015 5\' 6"  (1.676 m)     Head Circumference --      Peak Flow --      Pain Score --      Pain Loc --      Pain Edu? --      Excl. in [place]? --     Constitutional: Alert and oriented. Well appearing and in mild distress. Eyes: Conjunctivae are normal. PERRL. EOMI. Head: Atraumatic. Nose: No congestion/rhinnorhea. Mouth/Throat: Mucous membranes are moist.  Oropharynx  non-erythematous. Cardiovascular: Normal rate, regular rhythm. Grossly normal heart sounds.  Good peripheral circulation. Respiratory: Normal respiratory effort.  No retractions. Lungs CTAB. Gastrointestinal: Soft and nontender. No distention. Positive bowel sounds Musculoskeletal: No lower extremity tenderness nor edema.   Neurologic:  Normal speech and language.  Skin:  Skin is warm, dry and intact.  Psychiatric: Mood and affect are normal. .  ____________________________________________   LABS (all labs ordered are listed, but only abnormal results are displayed)  Labs Reviewed  CBC - Abnormal; Notable for the following:       Result Value   WBC 16.7 (*)    MCV 79.5 (*)    RDW 14.8 (*)    All other components within normal limits  BASIC METABOLIC PANEL - Abnormal; Notable for the following:    Potassium 3.2 (*)    Glucose, Bld 110 (*)    All other components within normal limits  TROPONIN I - Abnormal; Notable for the following:    Troponin I 0.03 (*)    All other components within normal limits  ACETAMINOPHEN LEVEL - Abnormal; Notable for the following:    Acetaminophen (Tylenol), Serum <10 (*)    All other components within normal limits  URINE DRUG SCREEN, QUALITATIVE ([facility] ONLY)  ETHANOL  SALICYLATE LEVEL   ____________________________________________  EKG  ED ECG REPORT I, Loney Hering, the attending physician, personally viewed and interpreted this ECG.   Date: 06/25/2016  EKG Time: 0532  Rate: 79  Rhythm: normal sinus rhythm  Axis: normal  Intervals:none  ST&T Change: none  ____________________________________________  RADIOLOGY  none ____________________________________________   PROCEDURES  Procedure(s) performed: None  Procedures  Critical Care performed: No  ____________________________________________   INITIAL IMPRESSION / ASSESSMENT AND PLAN / ED COURSE  Pertinent labs & imaging results that were available during my care  of the patient were reviewed by me and considered in my medical decision making (see chart for details).  This is a 31 year old female who comes into the hospital today with some anxiety the patient has been having some chest pressure racing thoughts and feeling rushing of sensations in the back of her head. I did check some blood work to ensure that she didn't have any medical cause of the symptoms. I will have the patient seen by tele psychiatry who can give some recommendations of medications. The patient did receive a dose of Ativan for her symptoms.      ____________________________________________   FINAL CLINICAL IMPRESSION(S) / ED DIAGNOSES  Final diagnoses:  Anxiety      NEW MEDICATIONS STARTED DURING THIS VISIT:  New Prescriptions   No medications on file     Note:  This document was prepared using Dragon voice recognition software and may include unintentional dictation errors.    Loney Hering, MD 06/25/16 [phone]

## 2016-06-25 NOTE — ED Triage Notes (Addendum)
Patient ambulatory to triage with steady gait, without difficulty or distress noted, accomp by SO; pt reports some anxiety tonight; off meds x 3 months (lexapro) as discussed with her psychiatrist for trial period; denies any SI, denies any specific precipitating factor

## 2016-06-25 NOTE — ED Notes (Signed)
SOC placed at bedside for telepsych consult at this time.

## 2016-06-25 NOTE — ED Notes (Signed)
Pt visualized in NAD, sitting up in bed eating breakfast. Pt is alert and oriented at this time. Pt's SO remains at bedside, will continue to monitor for further patient needs.

## 2016-06-25 NOTE — ED Notes (Signed)
Soc  Done  Report  Given  To  Dr  Marcelene Butte  MD

## 2016-09-17 NOTE — Progress Notes (Signed)
HPI:  Anxiety and Depression: -worsened earlier 2018 and now seeig psychiatry - Laura [name] -meds: lexapro 20mg , hydroxyzine prn - rarely uses -ER visit 06/2016 for anxiety -reports doing much better now  Obesity: -no regular exercise, does get 5000-8000 steps daily at work -diet not great but working on it  RA: -seeing Dr. Trudie Reed in Rheumatology for management -meds: Plaquenil -stable  Mosquito bites: -she reacts to them and gets itchy bumps -dogs bring them in the house  ROS: See pertinent positives and negatives per HPI.  Past Medical History:  Diagnosis Date  . Arthritis   . Depression   . Frequent headaches   . History of chicken pox   . Otitis media    had tubes as child, then according to pt adipose tissue used to repair ear drum  . Rheumatoid arthritis ([place])   . UTI (lower urinary tract infection)     Past Surgical History:  Procedure Laterality Date  . APPENDECTOMY  2012  . HYMENECTOMY  2000  . TONSILECTOMY, ADENOIDECTOMY, BILATERAL MYRINGOTOMY AND TUBES      Family History  Problem Relation Age of Onset  . Arthritis Paternal Grandmother   . Cancer Maternal Grandmother   . Cancer Maternal Grandfather   . Liver cancer Paternal Grandfather     Social History   Social History  . Marital status: Single    Spouse name: N/A  . Number of children: 0  . Years of education: N/A   Occupational History  . 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 Smoking status: Never Smoker  . Smokeless tobacco: Never Used  . Alcohol use 0.0 oz/week     Comment: occasionally   . Drug use: No  . Sexual activity: Yes    Birth control/ protection: Pill   Other Topics Concern  . 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  escitalopram  (LEXAPRO) 20 MG tablet, Take 20 mg by mouth every morning., Disp: , Rfl: 0 .  hydroxychloroquine (PLAQUENIL) 200 MG tablet, Take by mouth daily., Disp: , Rfl:  .  hydrOXYzine (ATARAX/VISTARIL) 25 MG tablet, take 1/2 to 1 tablet by mouth once daily if needed for anxiety, Disp: , Rfl: 0 .  NAPROXEN PO, Take by mouth as needed., Disp: , Rfl:   EXAM:  Vitals:   09/18/16 1258  BP: 116/82  Pulse: 73  Temp: 97.9 F (36.6 C)    Body mass index is 40.42 kg/m.  GENERAL: vitals reviewed and listed above, alert, oriented, appears well hydrated and in no acute distress  HEENT: atraumatic, conjunttiva clear, no obvious abnormalities on inspection of external nose and ears  NECK: no obvious masses on inspection  LUNGS: clear to auscultation bilaterally, no wheezes, rales or rhonchi, [name] air movement  CV: HRRR, no peripheral edema  MS: moves all extremities without noticeable abnormality  PSYCH: pleasant and cooperative, no obvious depression or anxiety  ASSESSMENT AND PLAN:  Discussed the following assessment and plan:  Anxiety and depression -managed by psychiatry -glad doing better  Rheumatoid arthritis, involving unspecified site, unspecified rheumatoid factor presence ([place]) -managed by rheumatology -stable  Insect bite, initial encounter -HC cream for bites -prevention  discussed: natural repellent for yard, insect repellent, vet for repellent for dogs, etc  BMI 40.0-44.9, adult ([place]) -lifestyle recs -labs at CPE    Patient Instructions  BEFORE YOU LEAVE: -follow up: CPE in 4-6 months, come fasting and will plan to do labs   We recommend the following healthy lifestyle for LIFE: 1) Small portions.   Tip: eat off of a salad plate instead of a dinner plate.  Tip: if you need more or a snack choose fruits, veggies and/or a handful of nuts or seeds.  2) Eat a healthy clean diet.  * Tip: Avoid (less then 1 serving per week): processed foods, sweets, sweetened drinks,  white starches (rice, flour, bread, potatoes, pasta, etc), red meat, fast foods, butter  *Tip: CHOOSE instead   * 5-9 servings per day of fresh or frozen fruits and vegetables (but not corn, potatoes, bananas, canned or dried fruit)   *nuts and seeds, beans   *olives and olive oil   *small portions of lean meats such as fish and white chicken    *small portions of whole grains  3)Get at least 150 minutes of sweaty aerobic exercise per week.  4)Reduce stress - consider counseling, meditation and relaxation to balance other aspects of your life.    Colin Benton R., DO

## 2016-09-18 ENCOUNTER — Encounter: Payer: Self-pay | Admitting: Family Medicine

## 2016-09-18 ENCOUNTER — Ambulatory Visit (INDEPENDENT_AMBULATORY_CARE_PROVIDER_SITE_OTHER): Payer: Managed Care, Other (non HMO) | Admitting: Family Medicine

## 2016-09-18 VITALS — BP 116/82 | HR 73 | Temp 97.9°F | Ht 66.0 in | Wt 250.4 lb

## 2016-09-18 DIAGNOSIS — F329 Major depressive disorder, single episode, unspecified: Secondary | ICD-10-CM | POA: Diagnosis not present

## 2016-09-18 DIAGNOSIS — Z6841 Body Mass Index (BMI) 40.0 and over, adult: Secondary | ICD-10-CM

## 2016-09-18 DIAGNOSIS — F419 Anxiety disorder, unspecified: Secondary | ICD-10-CM

## 2016-09-18 DIAGNOSIS — M069 Rheumatoid arthritis, unspecified: Secondary | ICD-10-CM

## 2016-09-18 DIAGNOSIS — W57XXXA Bitten or stung by nonvenomous insect and other nonvenomous arthropods, initial encounter: Secondary | ICD-10-CM

## 2016-09-18 NOTE — Patient Instructions (Signed)
BEFORE YOU LEAVE: -follow up: CPE in 4-6 months, come fasting and will plan to do labs   We recommend the following healthy lifestyle for LIFE: 1) Small portions.   Tip: eat off of a salad plate instead of a dinner plate.  Tip: if you need more or a snack choose fruits, veggies and/or a handful of nuts or seeds.  2) Eat a healthy clean diet.  * Tip: Avoid (less then 1 serving per week): processed foods, sweets, sweetened drinks, white starches (rice, flour, bread, potatoes, pasta, etc), red meat, fast foods, butter  *Tip: CHOOSE instead   * 5-9 servings per day of fresh or frozen fruits and vegetables (but not corn, potatoes, bananas, canned or dried fruit)   *nuts and seeds, beans   *olives and olive oil   *small portions of lean meats such as fish and white chicken    *small portions of whole grains  3)Get at least 150 minutes of sweaty aerobic exercise per week.  4)Reduce stress - consider counseling, meditation and relaxation to balance other aspects of your life.

## 2017-01-01 ENCOUNTER — Encounter: Payer: Self-pay | Admitting: Family Medicine

## 2017-03-19 ENCOUNTER — Encounter: Payer: Managed Care, Other (non HMO) | Admitting: Family Medicine

## 2017-04-23 ENCOUNTER — Encounter: Payer: Self-pay | Admitting: Family Medicine

## 2017-05-11 NOTE — Progress Notes (Signed)
HPI:  Here for CPE:  -Concerns and/or follow up today:   Laura [name] is a pleasant 32 y.o. here for follow up. Chronic medical problems summarized below were reviewed for changes and stability and were updated as needed below. These issues and their treatment remain stable for the most part.  Reports is doing well for the most part.  She is no longer seeing her psychiatrist as her insurance does not pay for this.  She reports her mood has been stable on the Lexapro for a long time.  She wonders if we can refill this for her and manage as long as she is stable.  She denies any side ideation, history of hospitalization for depression, severe symptoms.  She has had a lot of stress at work chronically tired from this, but otherwise doing well. No regular exercise currently but very active at work.  Diet has not been great.. Denies CP, SOB, DOE, treatment intolerance or new symptoms.   Anxiety and Depression: -worsened earlier 2018, now stable on Lexapro -saw psychiatry, but could not afford -meds: lexapro 68m, hydroxyzine prn - rarely uses -Sees a cSocial workeron a regular basis, [employer] 06/2016 for anxiety -reports doing much better now  Obesity: -no regular exercise, does get 5000-8000 steps daily at work -diet not great but working on it  RA: -seeing Dr. HTrudie Reedin Rheumatology for management -meds: Plaquenil -stable  -Diet: variety of foods, balance and well rounded, larger portion sizes -Exercise: no regular exercise -Taking folic acid, vitamin D or calcium: no -Diabetes and Dyslipidemia Screening: Fasting for labs -Vaccines: see vaccine section Epic -reports she had her flu shot this year -pap history: Sees gynecologist, reports last Pap smear was normal in 2018 -FDLMP: see nursing notes -sexual activity: yes, female partner, no new partners -wants STI testing (Hep C if born 18-65: no -FH breast, colon or ovarian ca: see FH Last  mammogram: n/a Last colon cancer screening: n/a Breast Ca Risk Assessment: see family history and pt history DEXA (>/= 622: n/a  -Alcohol, Tobacco, drug use: see social history  Review of Systems - no fevers, unintentional weight loss, vision loss, hearing loss, chest pain, sob, hemoptysis, melena, hematochezia, hematuria, genital discharge, changing or concerning skin lesions, bleeding, bruising, loc, thoughts of self harm or SI  Past Medical History:  Diagnosis Date  . Arthritis   . Depression   . Frequent headaches   . History of chicken pox   . Otitis media    had tubes as child, then according to pt adipose tissue used to repair ear drum  . Rheumatoid arthritis ([place]   . UTI (lower urinary tract infection)     Past Surgical History:  Procedure Laterality Date  . APPENDECTOMY  2012  . HYMENECTOMY  2000  . TONSILECTOMY, ADENOIDECTOMY, BILATERAL MYRINGOTOMY AND TUBES      Family History  Problem Relation Age of Onset  . Arthritis Paternal Grandmother   . Cancer Maternal Grandmother   . Cancer Maternal Grandfather   . Liver cancer Paternal Grandfather     Social History   Socioeconomic History  . Marital status: Single    Spouse name: None  . Number of children: 0  . Years of education: None  . Highest education level: None  Social Needs  . Financial resource strain: None  . Food insecurity - worry: None  . Food insecurity - inability: None  . Transportation needs - medical: None  . Transportation needs -  non-medical: None  Occupational History  . Occupation: Teacher, music: [employer]  Tobacco Use  . Smoking status: Never Smoker  . Smokeless tobacco: Never Used  Substance and Sexual Activity  . Alcohol use: Yes    Alcohol/week: 0.0 oz    Comment: occasionally   . Drug use: No  . Sexual activity: Yes    Birth control/protection: Pill  Other Topics Concern  . 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  escitalopram (LEXAPRO) 20 MG tablet, Take 1 tablet (20 mg total) by mouth every morning., Disp: 90 tablet, Rfl: 1 .  hydroxychloroquine (PLAQUENIL) 200 MG tablet, Take by mouth daily., Disp: , Rfl:  .  hydrOXYzine (ATARAX/VISTARIL) 25 MG tablet, take 1/2 to 1 tablet by mouth once daily if needed for anxiety, Disp: , Rfl: 0 .  NAPROXEN PO, Take by mouth as needed., Disp: , Rfl:   EXAM:  Vitals:   05/12/17 0938  BP: 100/78  Pulse: 74  Temp: 98 F (36.7 C)    GENERAL: vitals reviewed and listed below, alert, oriented, appears well hydrated and in no acute distress  HEENT: head atraumatic, PERRLA, normal appearance of eyes, ears, nose and mouth. moist mucus membranes.  NECK: supple, no masses or lymphadenopathy  LUNGS: clear to auscultation bilaterally, no rales, rhonchi or wheeze  CV: HRRR, no peripheral edema or cyanosis, normal pedal pulses  ABDOMEN: bowel sounds normal, soft, non tender to palpation, no masses, no rebound or guarding  GU/BREAST: Declined, sees GYN  SKIN: no rash or abnormal lesions  MS: normal gait, moves all extremities normally  NEURO: normal gait, speech and thought processing grossly intact, muscle tone grossly intact throughout  PSYCH: normal affect, pleasant and cooperative  ASSESSMENT AND PLAN:  Discussed the following assessment and plan:  PREVENTIVE EXAM: -Discussed and advised all [place] preventive services health task force level A and B recommendations for age, sex and risks. -Advised at least 150 minutes of exercise per week and a healthy diet with avoidance of (less then 1 serving per week) processed foods, white starches, red meat, fast foods and sweets and consisting of: * 5-9 servings of fresh fruits and vegetables (not corn or potatoes) *nuts and seeds, beans *olives  and olive oil *lean meats such as fish and white chicken  *whole grains -labs, studies and vaccines per orders this encounter  Mild episode of recurrent major depressive disorder ([place]) -Used to see psychiatry, now would like for [place] to take over -Her PHQ 9 score is 10, however she has no severe symptoms and reports has been doing well and is stable on the Lexapro -She is getting regular counseling -we will take over management of lexapro -follow up 3-4 months  BMI 40.0-44.9, adult ([place]) -Lifestyle recommendations, discussed importance of healthy diet forgot health -Advised regular activity, she gets quite a few steps at work which is good - labs per orders  Rheumatoid arthritis, involving unspecified site, unspecified rheumatoid factor presence ([place])  med -Sees rheumatology for management  Patient advised to return to clinic immediately if symptoms worsen or persist or new concerns.  Patient Instructions  BEFORE YOU LEAVE: -labs -PHQ9 to epic -follow up: 3-4 months  We have ordered labs or studies at this visit. It can take up to 1-2 weeks for results and processing. IF results require follow up or explanation, we will call you with instructions. Clinically stable results will be released to your [facility]. If you have not heard from [place] or cannot find your results in [facility] in 2 weeks please contact our office at [phone].  If you are not yet signed up for [facility], please consider signing up.   Preventive Care 18-39 Years, Female Preventive care refers to lifestyle choices and visits with your health care provider that can promote health and wellness. What does preventive care include?  A yearly physical exam. This is also called an annual well check.  Dental exams once or twice a year.  Routine eye exams. Ask your health care provider how often you should have your eyes checked.  Personal lifestyle choices, including: ? Daily care of your teeth and gums. ? Regular  physical activity. ? Eating a healthy diet. ? Avoiding tobacco and drug use. ? Limiting alcohol use. ? Practicing safe sex. ? Taking vitamin and mineral supplements as recommended by your health care provider. What happens during an annual well check? The services and screenings done by your health care provider during your annual well check will depend on your age, overall health, lifestyle risk factors, and family history of disease. Counseling Your health care provider may ask you questions about your:  Alcohol use.  Tobacco use.  Drug use.  Emotional well-being.  Home and relationship well-being.  Sexual activity.  Eating habits.  Work and work Statistician.  Method of birth control.  Menstrual cycle.  Pregnancy history.  Screening You may have the following tests or measurements:  Height, weight, and BMI.  Diabetes screening. This is done by checking your blood sugar (glucose) after you have not eaten for a while (fasting).  Blood pressure.  Lipid and cholesterol levels. These may be checked every 5 years starting at age 36.  Skin check.  Hepatitis C blood test.  Hepatitis B blood test.  Sexually transmitted disease (STD) testing.  BRCA-related cancer screening. This may be done if you have a family history of breast, ovarian, tubal, or peritoneal cancers.  Pelvic exam and Pap test. This may be done every 3 years starting at age 62. Starting at age 32, this may be done every 5 years if you have a Pap test in combination with an HPV test.  Discuss your test results, treatment options, and if necessary, the need for more tests with your health care provider. Vaccines Your health care provider may recommend certain vaccines, such as:  Influenza vaccine. This is recommended every year.  Tetanus, diphtheria, and acellular pertussis (Tdap, Td) vaccine. You may need a Td booster every 10 years.  Varicella vaccine. You may need this if you have not been  vaccinated.  HPV vaccine. If you are 3 or younger, you may need three doses over 6 months.  Measles, mumps, and rubella (MMR) vaccine. You may need at least one dose of MMR. You may also need a second dose.  Pneumococcal 13-valent conjugate (PCV13) vaccine. You may need this if you have certain conditions and were not previously vaccinated.  Pneumococcal polysaccharide (PPSV23) vaccine. You may need one or two doses if you smoke cigarettes or if you have certain conditions.  Meningococcal vaccine. One dose is recommended if you are age 14-21 years and a first-year college student living in a residence hall, or if you have  one of several medical conditions. You may also need additional booster doses.  Hepatitis A vaccine. You may need this if you have certain conditions or if you travel or work in places where you may be exposed to hepatitis A.  Hepatitis B vaccine. You may need this if you have certain conditions or if you travel or work in places where you may be exposed to hepatitis B.  Haemophilus influenzae type b (Hib) vaccine. You may need this if you have certain risk factors.  Talk to your health care provider about which screenings and vaccines you need and how often you need them. This information is not intended to replace advice given to you by your health care provider. Make sure you discuss any questions you have with your health care provider. Document Released: 05/27/2001 Document Revised: 12/19/2015 Document Reviewed: 01/30/2015 Elsevier Interactive Patient Education  2018 [employer].          No Follow-up on file.  [name], DO

## 2017-05-12 ENCOUNTER — Ambulatory Visit (INDEPENDENT_AMBULATORY_CARE_PROVIDER_SITE_OTHER): Payer: Managed Care, Other (non HMO) | Admitting: Family Medicine

## 2017-05-12 ENCOUNTER — Encounter: Payer: Self-pay | Admitting: Family Medicine

## 2017-05-12 VITALS — BP 100/78 | HR 74 | Temp 98.0°F | Ht 65.75 in | Wt 243.8 lb

## 2017-05-12 DIAGNOSIS — Z0000 Encounter for general adult medical examination without abnormal findings: Secondary | ICD-10-CM

## 2017-05-12 DIAGNOSIS — M069 Rheumatoid arthritis, unspecified: Secondary | ICD-10-CM | POA: Diagnosis not present

## 2017-05-12 DIAGNOSIS — Z6841 Body Mass Index (BMI) 40.0 and over, adult: Secondary | ICD-10-CM | POA: Diagnosis not present

## 2017-05-12 DIAGNOSIS — F330 Major depressive disorder, recurrent, mild: Secondary | ICD-10-CM | POA: Diagnosis not present

## 2017-05-12 MED ORDER — ESCITALOPRAM OXALATE 20 MG PO TABS: 20.0000 mg | ORAL_TABLET | Freq: Every morning | ORAL | 1 refills | Status: DC

## 2017-05-12 NOTE — Patient Instructions (Signed)
BEFORE YOU LEAVE: -labs -PHQ9 to epic -follow up: 3-4 months  We have ordered labs or studies at this visit. It can take up to 1-2 weeks for results and processing. IF results require follow up or explanation, we will call you with instructions. Clinically stable results will be released to your [facility]. If you have not heard from [place] or cannot find your results in [facility] in 2 weeks please contact our office at [phone].  If you are not yet signed up for [facility], please consider signing up.   Preventive Care 18-39 Years, Female Preventive care refers to lifestyle choices and visits with your health care provider that can promote health and wellness. What does preventive care include?  A yearly physical exam. This is also called an annual well check.  Dental exams once or twice a year.  Routine eye exams. Ask your health care provider how often you should have your eyes checked.  Personal lifestyle choices, including: ? Daily care of your teeth and gums. ? Regular physical activity. ? Eating a healthy diet. ? Avoiding tobacco and drug use. ? Limiting alcohol use. ? Practicing safe sex. ? Taking vitamin and mineral supplements as recommended by your health care provider. What happens during an annual well check? The services and screenings done by your health care provider during your annual well check will depend on your age, overall health, lifestyle risk factors, and family history of disease. Counseling Your health care provider may ask you questions about your:  Alcohol use.  Tobacco use.  Drug use.  Emotional well-being.  Home and relationship well-being.  Sexual activity.  Eating habits.  Work and work Statistician.  Method of birth control.  Menstrual cycle.  Pregnancy history.  Screening You may have the following tests or measurements:  Height, weight, and BMI.  Diabetes screening. This is done by checking your blood sugar (glucose) after you have  not eaten for a while (fasting).  Blood pressure.  Lipid and cholesterol levels. These may be checked every 5 years starting at age 56.  Skin check.  Hepatitis C blood test.  Hepatitis B blood test.  Sexually transmitted disease (STD) testing.  BRCA-related cancer screening. This may be done if you have a family history of breast, ovarian, tubal, or peritoneal cancers.  Pelvic exam and Pap test. This may be done every 3 years starting at age 67. Starting at age 16, this may be done every 5 years if you have a Pap test in combination with an HPV test.  Discuss your test results, treatment options, and if necessary, the need for more tests with your health care provider. Vaccines Your health care provider may recommend certain vaccines, such as:  Influenza vaccine. This is recommended every year.  Tetanus, diphtheria, and acellular pertussis (Tdap, Td) vaccine. You may need a Td booster every 10 years.  Varicella vaccine. You may need this if you have not been vaccinated.  HPV vaccine. If you are 71 or younger, you may need three doses over 6 months.  Measles, mumps, and rubella (MMR) vaccine. You may need at least one dose of MMR. You may also need a second dose.  Pneumococcal 13-valent conjugate (PCV13) vaccine. You may need this if you have certain conditions and were not previously vaccinated.  Pneumococcal polysaccharide (PPSV23) vaccine. You may need one or two doses if you smoke cigarettes or if you have certain conditions.  Meningococcal vaccine. One dose is recommended if you are age 60-21 years and a Advertising account planner  college student living in a residence hall, or if you have one of several medical conditions. You may also need additional booster doses.  Hepatitis A vaccine. You may need this if you have certain conditions or if you travel or work in places where you may be exposed to hepatitis A.  Hepatitis B vaccine. You may need this if you have certain conditions or if  you travel or work in places where you may be exposed to hepatitis B.  Haemophilus influenzae type b (Hib) vaccine. You may need this if you have certain risk factors.  Talk to your health care provider about which screenings and vaccines you need and how often you need them. This information is not intended to replace advice given to you by your health care provider. Make sure you discuss any questions you have with your health care provider. Document Released: 05/27/2001 Document Revised: 12/19/2015 Document Reviewed: 01/30/2015 Elsevier Interactive Patient Education  [employer].

## 2017-05-13 LAB — HEMOGLOBIN A1C
Est. average glucose Bld gHb Est-mCnc: 117 mg/dL
Hgb A1c MFr Bld: 5.7 % — ABNORMAL HIGH (ref 4.8–5.6)

## 2017-05-13 LAB — LIPID PANEL
Chol/HDL Ratio: 5.4 ratio — ABNORMAL HIGH (ref 0.0–4.4)
Cholesterol, Total: 167 mg/dL (ref 100–199)
HDL: 31 mg/dL — ABNORMAL LOW
LDL Calculated: 113 mg/dL — ABNORMAL HIGH (ref 0–99)
Triglycerides: 113 mg/dL (ref 0–149)
VLDL Cholesterol Cal: 23 mg/dL (ref 5–40)

## 2017-08-12 NOTE — Progress Notes (Signed)
HPI:  Using dictation device. Unfortunately this device frequently misinterprets words/phrases.  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  She reports she has been doing well, reports her mood is great.  Wishes to continue the Lexapro.  No significant anxiety or depression.  Is very active at work.  Tries to avoid artificial sweeteners. She reports she does not have any vaccine record as her prior doctor retired and did not have records.  She needs titers for MMR, varicella and hepatitis B.  She also request a QuantiFERON gold test.  She requests that these be given as a prescription so that she can get them through her lab.  Anxiety and Depression: -worsened earlier 2018, now stable on Lexapro -saw psychiatry, but could not afford -meds: lexapro 46m, hydroxyzine prn - rarely uses -Sees a cSocial workeron a regular basis[name] Obesity/hyperglycemia/hyperlipidemia: -no regular exercise, does get 5000-8000 steps daily at work -diet not great but working on it  RA: -seeing Dr. [facility] for management -meds: Plaquenil -stable   ROS: See pertinent positives and negatives per HPI.  Past Medical History:  Diagnosis Date  . Arthritis   . Depression   . Frequent headaches   . History of chicken pox   . Otitis media    had tubes as child, then according to pt adipose tissue used to repair ear drum  . [place]   . UTI (lower urinary tract infection)     Past Surgical History:  Procedure Laterality Date  . APPENDECTOMY  2012  . HYMENECTOMY  2000  . TONSILECTOMY, ADENOIDECTOMY, BILATERAL MYRINGOTOMY AND TUBES      Family History  Problem Relation Age of Onset  . Arthritis Paternal Grandmother   . Cancer Maternal Grandmother   . Cancer Maternal Grandfather   . Liver cancer Paternal Grandfather     SOCIAL HX: See HPI   Current  Outpatient Medications:  .  escitalopram (LEXAPRO) 20 MG tablet, Take 1 tablet (20 mg total) by mouth every morning., Disp: 90 tablet, Rfl: 1 .  hydroxychloroquine (PLAQUENIL) 200 MG tablet, Take by mouth daily., Disp: , Rfl:  .  NAPROXEN PO, Take by mouth as needed., Disp: , Rfl:   EXAM:  Vitals:   08/13/17 1107  BP: 100/80  Pulse: 73  Temp: 98.2 F (36.8 C)    Body mass index is 39.16 kg/m.  GENERAL: vitals reviewed and listed above, alert, oriented, appears well hydrated and in no acute distress  HEENT: atraumatic, conjunttiva clear, no obvious abnormalities on inspection of external nose and ears  NECK: no obvious masses on inspection  LUNGS: clear to auscultation bilaterally, no wheezes, rales or rhonchi, good air movement  CV: HRRR, no peripheral edema  MS: moves all extremities without noticeable abnormality  PSYCH: pleasant and cooperative, no obvious depression or anxiety  ASSESSMENT AND PLAN:  Discussed the following assessment and plan:  Need for tetanus booster - Plan: Td vaccine greater than or equal to 7yo preservative free IM  Vaccine counseling  Mild episode of recurrent major depressive disorder (HCC)  BMI 40.0-44.9, adult (HCC)  -titer orders provided to pt -she wanted her tetanus booster today -form completed per prior physical and [facility] for titers -chronic conditions stable -advised lifestyle  Changes and wt reduction -f/u 3-4 months, sooner as needed  Patient Instructions  BEFORE YOU LEAVE: -follow up: 3-4 months  Laura [name] will complete the forms once we get the titers back.  Recommend a healthy low sugar diet and regular exercise for sugar, cholesterol and wt reduction. Recommend a 10-20 lb weight reduction over the next 3-6 months.   We recommend the following healthy lifestyle for LIFE: 1) Small portions. But, make sure to get regular (at least 3 per day), healthy meals and small healthy snacks if needed.  2) Eat a  healthy clean diet.   TRY TO EAT: -at least 5-7 servings of low sugar, colorful, and nutrient rich vegetables per day (not corn, potatoes or bananas.) -berries are the best choice if you wish to eat fruit (only eat small amounts if trying to reduce weight)  -lean meets (fish, white meat of chicken or [place]) -vegan proteins for some meals - beans or tofu, whole grains, nuts and seeds -Replace bad fats with good fats - good fats include: fish, nuts and seeds, canola oil, olive oil -small amounts of low fat or non fat dairy -small amounts of100 % whole grains - check the lables -drink plenty of water  AVOID: -SUGAR, sweets, anything with added sugar, corn syrup or sweeteners - must read labels as even foods advertised as "healthy" often are loaded with sugar -if you must have a sweetener, small amounts of stevia may be best -sweetened beverages and artificially sweetened beverages -simple starches (rice, bread, potatoes, pasta, chips, etc - small amounts of 100% whole grains are ok) -red meat, pork, butter -fried foods, fast food, processed food, excessive dairy, eggs and coconut.  3)Get at least 150 minutes of sweaty aerobic exercise per week.  4)Reduce stress - consider counseling, meditation and relaxation to balance other aspects of your life.     Laura Kern, DO

## 2017-08-13 ENCOUNTER — Ambulatory Visit: Payer: Managed Care, Other (non HMO) | Admitting: Family Medicine

## 2017-08-13 ENCOUNTER — Encounter: Payer: Self-pay | Admitting: Family Medicine

## 2017-08-13 VITALS — BP 100/80 | HR 73 | Temp 98.2°F | Ht 65.75 in | Wt 240.8 lb

## 2017-08-13 DIAGNOSIS — F330 Major depressive disorder, recurrent, mild: Secondary | ICD-10-CM | POA: Diagnosis not present

## 2017-08-13 DIAGNOSIS — Z7189 Other specified counseling: Secondary | ICD-10-CM | POA: Diagnosis not present

## 2017-08-13 DIAGNOSIS — Z6841 Body Mass Index (BMI) 40.0 and over, adult: Secondary | ICD-10-CM

## 2017-08-13 DIAGNOSIS — Z7185 Encounter for immunization safety counseling: Secondary | ICD-10-CM

## 2017-08-13 DIAGNOSIS — Z23 Encounter for immunization: Secondary | ICD-10-CM | POA: Diagnosis not present

## 2017-08-13 NOTE — Patient Instructions (Signed)
BEFORE YOU LEAVE: -follow up: 3-4 months  Laura [name] will complete the forms once we get the titers back.  Recommend a healthy low sugar diet and regular exercise for sugar, cholesterol and wt reduction. Recommend a 10-20 lb weight reduction over the next 3-6 months.   We recommend the following healthy lifestyle for LIFE: 1) Small portions. But, make sure to get regular (at least 3 per day), healthy meals and small healthy snacks if needed.  2) Eat a healthy clean diet.   TRY TO EAT: -at least 5-7 servings of low sugar, colorful, and nutrient rich vegetables per day (not corn, potatoes or bananas.) -berries are the best choice if you wish to eat fruit (only eat small amounts if trying to reduce weight)  -lean meets (fish, white meat of chicken or [place]) -vegan proteins for some meals - beans or tofu, whole grains, nuts and seeds -Replace bad fats with good fats - good fats include: fish, nuts and seeds, canola oil, olive oil -small amounts of low fat or non fat dairy -small amounts of100 % whole grains - check the lables -drink plenty of water  AVOID: -SUGAR, sweets, anything with added sugar, corn syrup or sweeteners - must read labels as even foods advertised as "healthy" often are loaded with sugar -if you must have a sweetener, small amounts of stevia may be best -sweetened beverages and artificially sweetened beverages -simple starches (rice, bread, potatoes, pasta, chips, etc - small amounts of 100% whole grains are ok) -red meat, pork, butter -fried foods, fast food, processed food, excessive dairy, eggs and coconut.  3)Get at least 150 minutes of sweaty aerobic exercise per week.  4)Reduce stress - consider counseling, meditation and relaxation to balance other aspects of your life.

## 2017-08-14 ENCOUNTER — Encounter: Payer: Self-pay | Admitting: Family Medicine

## 2017-09-10 ENCOUNTER — Ambulatory Visit: Payer: Managed Care, Other (non HMO) | Admitting: Family Medicine

## 2017-09-12 ENCOUNTER — Encounter (HOSPITAL_COMMUNITY): Payer: Self-pay

## 2017-09-12 ENCOUNTER — Other Ambulatory Visit: Payer: Self-pay

## 2017-09-12 ENCOUNTER — Emergency Department (HOSPITAL_COMMUNITY)
Admission: EM | Admit: 2017-09-12 | Discharge: 2017-09-12 | Disposition: A | Discharge status: Home / Self Care | Payer: Managed Care, Other (non HMO) | Attending: Emergency Medicine | Admitting: Emergency Medicine

## 2017-09-12 DIAGNOSIS — F419 Anxiety disorder, unspecified: Secondary | ICD-10-CM | POA: Insufficient documentation

## 2017-09-12 DIAGNOSIS — Z79899 Other long term (current) drug therapy: Secondary | ICD-10-CM | POA: Insufficient documentation

## 2017-09-12 HISTORY — DX: Anxiety disorder, unspecified: F41.9

## 2017-09-12 LAB — CBC WITH DIFFERENTIAL/PLATELET
Basophils Absolute: 0 10*3/uL (ref 0.0–0.1)
Basophils Relative: 0 %
Eosinophils Absolute: 0 10*3/uL (ref 0.0–0.7)
Eosinophils Relative: 0 %
HCT: 41.7 % (ref 36.0–46.0)
Hemoglobin: 13.2 g/dL (ref 12.0–15.0)
Lymphocytes Relative: 12 %
Lymphs Abs: 1.8 10*3/uL (ref 0.7–4.0)
MCH: 25.3 pg — ABNORMAL LOW (ref 26.0–34.0)
MCHC: 31.7 g/dL (ref 30.0–36.0)
MCV: 80 fL (ref 78.0–100.0)
Monocytes Absolute: 0.6 10*3/uL (ref 0.1–1.0)
Monocytes Relative: 4 %
Neutro Abs: 12.5 10*3/uL — ABNORMAL HIGH (ref 1.7–7.7)
Neutrophils Relative %: 84 %
Platelets: 350 10*3/uL (ref 150–400)
RBC: 5.21 MIL/uL — ABNORMAL HIGH (ref 3.87–5.11)
RDW: 14.1 % (ref 11.5–15.5)
WBC: 15 10*3/uL — ABNORMAL HIGH (ref 4.0–10.5)

## 2017-09-12 LAB — BASIC METABOLIC PANEL
Anion gap: 11 (ref 5–15)
BUN: 10 mg/dL (ref 6–20)
CO2: 24 mmol/L (ref 22–32)
Calcium: 9.5 mg/dL (ref 8.9–10.3)
Chloride: 108 mmol/L (ref 101–111)
Creatinine, Ser: 0.85 mg/dL (ref 0.44–1.00)
GFR calc Af Amer: >60 mL/min (ref >60)
GFR calc non Af Amer: >60 mL/min (ref >60)
Glucose, Bld: 117 mg/dL — ABNORMAL HIGH (ref 65–99)
Potassium: 4.1 mmol/L (ref 3.5–5.1)
Sodium: 143 mmol/L (ref 135–145)

## 2017-09-12 LAB — I-STAT BETA HCG BLOOD, ED (MC, WL, AP ONLY): I-stat hCG, quantitative: <5 m[IU]/mL (ref <5)

## 2017-09-12 MED ORDER — HYDROXYZINE HCL 25 MG PO TABS: 25.0000 mg | ORAL_TABLET | Freq: Four times a day (QID) | ORAL | 0 refills | Status: DC | PRN

## 2017-09-12 MED ORDER — LORAZEPAM 1 MG PO TABS: 1.0000 mg | ORAL_TABLET | Freq: Three times a day (TID) | ORAL | 0 refills | Status: DC | PRN

## 2017-09-12 MED ORDER — LORAZEPAM 1 MG PO TABS
1.0000 mg | ORAL_TABLET | Freq: Once | ORAL | Status: AC
  Administered 2017-09-12: 1 mg via ORAL
  Filled 2017-09-12: qty 1

## 2017-09-12 MED ORDER — HYDROXYZINE HCL 25 MG PO TABS
50.0000 mg | ORAL_TABLET | Freq: Once | ORAL | Status: AC
  Administered 2017-09-12: 50 mg via ORAL
  Filled 2017-09-12: qty 2

## 2017-09-12 MED ORDER — LORAZEPAM 0.5 MG PO TABS: 0.5000 mg | ORAL_TABLET | Freq: Once | ORAL | Status: DC

## 2017-09-12 NOTE — ED Triage Notes (Signed)
She denies s.i./h.i.

## 2017-09-12 NOTE — ED Provider Notes (Signed)
[facility] DEPT Provider Note   CSN: [id number] Arrival date & time: 09/12/17  1731     History   Chief Complaint Chief Complaint  Patient presents with  . Panic Attack    HPI Laura [name] is a 32 y.o. female with history of anxiety, arthritis, depression, rheumatoid arthritis presents for evaluation of acute onset, waxing and waning panic attacks since yesterday.  She states that yesterday after work at around 9:30 PM she developed feelings of anxiety, racing thoughts, tingling of her upper extremities, and nausea.  These feelings will wax and wane but have been pretty persistent since yesterday into today.  She denies shortness of breath or chest pain but states that when her symptoms worsen she notices that she is breathing somewhat shallowly.  She states that she will also feel flushed but have chills at the same time as well.  Mother states that when her symptoms worsen she will also begin crying.  She states that she had identical symptoms 1 year ago which required her to present to an ED for evaluation.  Her symptoms improved with Ativan while in the ED but she was not sent home with a prescription.  She has attempted hydroxyzine in the past which will has not been helpful.  Today she took Benadryl which she states was not helpful.  She denies fevers, recent travel or surgery, hemoptysis, prior history of DVT or PE.  She is not on OCPs but has a copper IUD.  Her anxiety and depression are managed by her primary care physician.  She states that she was put on Celexa 20 mg but tells me she has not taken it for the better part of a year.  She is a non-smoker, drinks alcohol occasionally, and denies any recreational drug use.  She denies suicidal ideation, homicidal ideation, or auditory or visual hallucinations.  The history is provided by the patient.    Past Medical History:  Diagnosis Date  . Anxiety   . Arthritis   . Depression   . Frequent  headaches   . History of chicken pox   . Otitis media    had tubes as child, then according to pt adipose tissue used to repair ear drum  . Rheumatoid arthritis ([place])   . UTI (lower urinary tract infection)     Patient Active Problem List   Diagnosis Date Noted  . BMI 40.0-44.9, adult ([place]) 06/19/2016  . Mild episode of recurrent major depressive disorder ([place]) 06/19/2016  . History of IBS 03/28/2014  . Alternating constipation and diarrhea 05/18/2012  . Rheumatoid arthritis ([place]) 04/29/2012    Past Surgical History:  Procedure Laterality Date  . APPENDECTOMY  2012  . HYMENECTOMY  2000  . TONSILECTOMY, ADENOIDECTOMY, BILATERAL MYRINGOTOMY AND TUBES       OB History   None      Home Medications    Prior to Admission medications   Medication Sig Start Date End Date Taking? Authorizing Provider  escitalopram (LEXAPRO) 20 MG tablet Take 1 tablet (20 mg total) by mouth every morning. 05/12/17  Yes Colin Benton R, DO  naproxen (NAPROSYN) 500 MG tablet Take 500 mg by mouth as needed (joint pain).    Yes [provider]  hydrOXYzine (ATARAX/VISTARIL) 25 MG tablet Take 1 tablet (25 mg total) by mouth every 6 (six) hours as needed for anxiety. 09/12/17   Reema Chick A, PA-C  LORazepam (ATIVAN) 1 MG tablet Take 1 tablet (1 mg total) by mouth  every 8 (eight) hours as needed for anxiety. 09/12/17   Renita Papa, PA-C    Family History Family History  Problem Relation Age of Onset  . Arthritis Paternal Grandmother   . Cancer Maternal Grandmother   . Cancer Maternal Grandfather   . Liver cancer Paternal Grandfather     Social History Social History   Tobacco Use  . Smoking status: Never Smoker  . Smokeless tobacco: Never Used  Substance Use Topics  . Alcohol use: Yes    Alcohol/week: 0.0 oz    Comment: occasionally   . Drug use: No     Allergies   Kenalog [triamcinolone acetonide] and Amoxicillin   Review of Systems Review of Systems  Constitutional: Positive  for chills. Negative for fever.  Respiratory: Negative for shortness of breath.   Cardiovascular: Negative for chest pain.  Gastrointestinal: Positive for nausea. Negative for vomiting.  Neurological: Negative for syncope, weakness and numbness.  Psychiatric/Behavioral: The patient is nervous/anxious.   All other systems reviewed and are negative.    Physical Exam Updated Vital Signs BP (!) 132/93 (BP Location: Right Arm)   Pulse 92   Temp 98.6 F (37 C) (Oral)   Resp 18   LMP 08/17/2017 (Approximate)   SpO2 96%   Physical Exam  Constitutional: She is oriented to person, place, and time. She appears well-developed and well-nourished. No distress.  Standing in room, pacing frequently, appears anxious   HENT:  Head: Normocephalic and atraumatic.  Eyes: Conjunctivae are normal. Right eye exhibits no discharge. Left eye exhibits no discharge.  Neck: No JVD present. No tracheal deviation present.  Cardiovascular: Normal rate, regular rhythm, normal heart sounds and intact distal pulses.  2+ radial and DP/PT pulses bl, negative Homan's bl, no LE edema  Pulmonary/Chest: Effort normal and breath sounds normal. No stridor. No respiratory distress. She has no wheezes. She has no rales. She exhibits no tenderness.  Abdominal: Soft. Bowel sounds are normal. She exhibits no distension. There is no tenderness. There is no guarding.  Musculoskeletal: Normal range of motion. She exhibits no edema or tenderness.  Neurological: She is alert and oriented to person, place, and time. No cranial nerve deficit or sensory deficit. She exhibits normal muscle tone.  Fluent speech with no evidence of dysarthria or aphasia, no facial droop, sensation intact to soft touch of extremities.  Good grip strength bilaterally.  5/5 strength of BUE and BLE major muscle groups.  Skin: Skin is warm and dry. No erythema.  Psychiatric: Her speech is normal and behavior is normal. Her mood appears anxious. She is not  actively hallucinating. She expresses no homicidal and no suicidal ideation. She expresses no suicidal plans and no homicidal plans.  Does not appear to be responding to internal stimuli at this time.  Nursing note and vitals reviewed.    ED Treatments / Results  Labs (all labs ordered are listed, but only abnormal results are displayed) Labs Reviewed  BASIC METABOLIC PANEL - Abnormal; Notable for the following components:      Result Value   Glucose, Bld 117 (*)    All other components within normal limits  CBC WITH DIFFERENTIAL/PLATELET - Abnormal; Notable for the following components:   WBC 15.0 (*)    RBC 5.21 (*)    MCH 25.3 (*)    Neutro Abs 12.5 (*)    All other components within normal limits  I-STAT BETA HCG BLOOD, ED ([facility], [facility], [facility] ONLY)    EKG None  Radiology No  results found.  Procedures Procedures (including critical care time)  Medications Ordered in ED Medications  LORazepam (ATIVAN) tablet 1 mg (1 mg Oral Given 09/12/17 1908)  hydrOXYzine (ATARAX/VISTARIL) tablet 50 mg (50 mg Oral Given 09/12/17 2042)     Initial Impression / Assessment and Plan / ED Course  I have reviewed the triage vital signs and the nursing notes.  Pertinent labs & imaging results that were available during my care of the patient were reviewed by me and considered in my medical decision making (see chart for details).     Patient presents for evaluation of panic attacks.  She was seen and evaluated for identical symptoms 1 year ago with improvement with Ativan in the ED.  She is afebrile, vital signs are stable.  She is nontoxic in appearance.  No focal neurologic deficits.  I doubt ICH, SAH, CVA, or other acute intracranial abnormality.  Lungs are clear to auscultation bilaterally in the absence of chest pain, shortness of breath, or any risk factors I doubt PE.  We will obtain baseline lab work and give Ativan and reassess.  Lab work reviewed by me shows a nonspecific leukocytosis.   Chart review shows that this is the patient's baseline.  No other acute abnormalities with regards to her lab work.  Patient notes very little improvement with Ativan and states "it just made me sleepy ".  We will give hydroxyzine and reassess.  On reevaluation, the patient states that she did feel better for some amount of time but her symptoms have worsened again. She is resting comfortably at this time and appears to be in no apparent distress. [place] controlled substance registry shows a very low overdose risk score and she has had only one prescription for a controlled substance which was for cough syrup in January 2018.  I do not believe her to be drug-seeking at this time.  We will give a small amount of Ativan to take as needed for her symptoms as well as hydroxyzine as needed.  I strongly encouraged the patient to contact her primary care physician to set up a follow-up appointment for reevaluation.  Discussed strict ED return precautions.  Patient and patient's mother verbalized understanding of and agreement with plan and patient is stable for discharge home at this time.  Final Clinical Impressions(s) / ED Diagnoses   Final diagnoses:  Anxiety    ED Discharge Orders        Ordered    LORazepam (ATIVAN) 1 MG tablet  Every 8 hours PRN     09/12/17 2114    hydrOXYzine (ATARAX/VISTARIL) 25 MG tablet  Every 6 hours PRN     09/12/17 2114       Renita Papa, PA-C 09/12/17 2222    Lacretia Leigh, MD 09/13/17 2251

## 2017-09-12 NOTE — Discharge Instructions (Signed)
Take medications as prescribed.  Follow-up with your primary care physician as soon as possible for reevaluation of your symptoms.  I would recommend calling your doctor's office and speaking with the on-call provider for further recommendations.  Return to the emergency department immediately for any concerning signs or symptoms develop such as suicidal ideation, fevers, chest pain, or shortness of breath.

## 2017-09-12 NOTE — ED Triage Notes (Signed)
She tells me she is having a panic attack. She states she had had one "just like this about a year ago". She states she has been on Citalopram "but I forget to take it a lot of the time". She states she did take a Citalopram this morning, with no real relief.

## 2017-09-14 NOTE — Progress Notes (Signed)
HPI:  Using dictation device. Unfortunately this device frequently misinterprets words/phrases.  Follow up from emergency room visit for anxiety: -referred to psychiatry in 2018 - reported she was seeing Pauline Good and was on lexapro (prn hydroxyzine) had reported she was stable and wanted [place] to refill at her last visit -about a month ago she reported doing great on lexapro -today reports: went to ER 6/1 for nausea, racing thoughts, anxiety and tingling - labs showed elevated wbcs and neutrophils, but they felt her symptoms were anxiety and treated with ativan -reports doing ok, had not been taking her lexapro consistently recently. Now back to taking it daily. plugged in to see her Counselor tomorrow Pamala Hurry), hates her job and is looking for another job - this is a source of stress. No si or thoughts of harm. See phq9. -denies:fevers, malaise, vomiting, cough, illness -has not seen her rheumatologist in some time -chronic elevation in WBCs per her report -in ER was given benzos  ROS: See pertinent positives and negatives per HPI.  Past Medical History:  Diagnosis Date  . Anxiety   . Arthritis   . Depression   . Frequent headaches   . History of chicken pox   . Otitis media    had tubes as child, then according to pt adipose tissue used to repair ear drum  . Rheumatoid arthritis ([place])   . UTI (lower urinary tract infection)     Past Surgical History:  Procedure Laterality Date  . APPENDECTOMY  2012  . HYMENECTOMY  2000  . TONSILECTOMY, ADENOIDECTOMY, BILATERAL MYRINGOTOMY AND TUBES      Family History  Problem Relation Age of Onset  . Arthritis Paternal Grandmother   . Cancer Maternal Grandmother   . Cancer Maternal Grandfather   . Liver cancer Paternal Grandfather     SOCIAL HX: see hpi   Current Outpatient Medications:  .  escitalopram (LEXAPRO) 20 MG tablet, Take 1 tablet (20 mg total) by mouth every morning., Disp: 90 tablet, Rfl: 1 .  hydrOXYzine  (ATARAX/VISTARIL) 25 MG tablet, Take 1 tablet (25 mg total) by mouth every 6 (six) hours as needed for anxiety., Disp: 12 tablet, Rfl: 0 .  LORazepam (ATIVAN) 1 MG tablet, Take 1 tablet (1 mg total) by mouth every 8 (eight) hours as needed for anxiety., Disp: 10 tablet, Rfl: 0 .  naproxen (NAPROSYN) 500 MG tablet, Take 500 mg by mouth as needed (joint pain). , Disp: , Rfl:   EXAM:  Vitals:   09/15/17 0818  BP: 100/62  Pulse: 84  Temp: 98.1 F (36.7 C)  SpO2: 96%    Body mass index is 38.87 kg/m.  GENERAL: vitals reviewed and listed above, alert, oriented, appears well hydrated and in no acute distress  HEENT: atraumatic, conjunttiva clear, no obvious abnormalities on inspection of external nose and ears  NECK: no obvious masses on inspection  LUNGS: clear to auscultation bilaterally, no wheezes, rales or rhonchi, good air movement  CV: HRRR, no peripheral edema  MS: moves all extremities without noticeable abnormality  PSYCH: pleasant and cooperative, no obvious depression or anxiety  ASSESSMENT AND PLAN:  Discussed the following assessment and plan:  Depression, recurrent (HCC)  GAD (generalized anxiety disorder)  Panic disorder  Rheumatoid arthritis, involving unspecified site, unspecified rheumatoid factor presence ([place])  -lexapro consistent use - advised pill box -advised increased CBT, healthy diet, exercise, consider OMM, re-eval with her rheumatologist in light of elevated WBCs -advised of risks with ativan and advised to use  sparingly and may have her see psychiatry again if this is needed despite other measures -follow up 1 month, sooner as needed -Patient advised to return or notify a doctor immediately if symptoms worsen or persist or new concerns arise.  Patient Instructions  BEFORE YOU LEAVE: -follow up: 1 month  See counselor.  Take the Lexapro every day  See your rheumatologist  Use the ativan OR hydroxyzine rarely as needed for panic - DO  not use together  I hope you are feeling better soon! Seek care promptly if your symptoms worsen, new concerns arise or you are not improving with treatment.      [name], DO

## 2017-09-15 ENCOUNTER — Ambulatory Visit: Payer: Managed Care, Other (non HMO) | Admitting: Family Medicine

## 2017-09-15 ENCOUNTER — Encounter: Payer: Self-pay | Admitting: Family Medicine

## 2017-09-15 VITALS — BP 100/62 | HR 84 | Temp 98.1°F | Ht 65.75 in | Wt 239.0 lb

## 2017-09-15 DIAGNOSIS — F339 Major depressive disorder, recurrent, unspecified: Secondary | ICD-10-CM

## 2017-09-15 DIAGNOSIS — F411 Generalized anxiety disorder: Secondary | ICD-10-CM

## 2017-09-15 DIAGNOSIS — F410 Panic disorder [episodic paroxysmal anxiety] without agoraphobia: Secondary | ICD-10-CM

## 2017-09-15 DIAGNOSIS — M069 Rheumatoid arthritis, unspecified: Secondary | ICD-10-CM | POA: Diagnosis not present

## 2017-09-15 MED ORDER — ESCITALOPRAM OXALATE 20 MG PO TABS: 20.0000 mg | ORAL_TABLET | Freq: Every morning | ORAL | 1 refills | Status: DC

## 2017-09-15 NOTE — Patient Instructions (Addendum)
BEFORE YOU LEAVE: -follow up: 1 month  See counselor.  Take the Lexapro every day  See your rheumatologist  Use the ativan OR hydroxyzine rarely as needed for panic - DO not use together  I hope you are feeling better soon! Seek care promptly if your symptoms worsen, new concerns arise or you are not improving with treatment.

## 2017-09-24 ENCOUNTER — Encounter: Payer: Self-pay | Admitting: Family Medicine

## 2017-09-24 ENCOUNTER — Other Ambulatory Visit: Payer: Self-pay

## 2017-09-24 ENCOUNTER — Ambulatory Visit: Payer: Managed Care, Other (non HMO) | Admitting: Family Medicine

## 2017-09-24 VITALS — BP 130/91 | HR 90 | Temp 98.8°F | Resp 16 | Ht 65.75 in | Wt 232.2 lb

## 2017-09-24 DIAGNOSIS — F339 Major depressive disorder, recurrent, unspecified: Secondary | ICD-10-CM | POA: Diagnosis not present

## 2017-09-24 DIAGNOSIS — F411 Generalized anxiety disorder: Secondary | ICD-10-CM | POA: Diagnosis not present

## 2017-09-24 DIAGNOSIS — F410 Panic disorder [episodic paroxysmal anxiety] without agoraphobia: Secondary | ICD-10-CM | POA: Diagnosis not present

## 2017-09-24 MED ORDER — LORAZEPAM 1 MG PO TABS: 1.0000 mg | ORAL_TABLET | Freq: Three times a day (TID) | ORAL | 0 refills | Status: DC | PRN

## 2017-09-24 MED ORDER — ARIPIPRAZOLE 2 MG PO TABS: 2.0000 mg | ORAL_TABLET | Freq: Every day | ORAL | 0 refills | Status: DC

## 2017-09-24 NOTE — Patient Instructions (Addendum)
1. Suicide prevention hotline - 1- [phone] 2. [facility] 3. [place], [phone]    IF you received an x-ray today, you will receive an invoice from [facility]. Please contact [facility] at [phone] with questions or concerns regarding your invoice.   IF you received labwork today, you will receive an invoice from [facility]. Please contact [facility] at [phone] with questions or concerns regarding your invoice.   Our billing staff will not be able to assist you with questions regarding bills from these companies.  You will be contacted with the lab results as soon as they are available. The fastest way to get your results is to activate your My Chart account. Instructions are located on the last page of this paperwork. If you have not heard from [place] regarding the results in 2 weeks, please contact this office.

## 2017-09-24 NOTE — Progress Notes (Signed)
Laura [name],  It appears this patient has transferred her care to another clinic Please let her know that we are aware and that we will cancel her follow up so that she does not get a no show fee. I change her PCP in epic.  Thanks.

## 2017-09-24 NOTE — Progress Notes (Signed)
6/13/20191:50 PM  Laura [name] 05-13-1985, 32 y.o. female [id number]  Chief Complaint  Patient presents with  . transfer of care/ talk about anxiety    HPI:   Patient is a 32 y.o. female with past medical history significant for anxiety who presents today to  Transfer care  She first experienced issues with anxiety and panic attacks in march 2018 during a break up She started lexapro and counseling Was able to stop lexapro and cont with counseling Until about 2 weeks ago, had forceful return of symptoms but this time with sign depression and hopelessness She has passive SI but denies any active SI Lives with her parents, feels she would tell her mother if she becomes actively [place] Is going to counseling but paying out of pocket, not sure how much more she is able to sustain this financially She hates her job but reports symptoms all day long Xanax given in ER, #10 tabs. Takes very prn Has been back on lexapro for 13 days  Fall Risk  09/24/2017  Falls in the past year? Yes     Depression screen [facility] 2/9 09/24/2017 09/15/2017 08/13/2017  Decreased Interest 3 2 0  Down, Depressed, Hopeless 3 2 0  PHQ - 2 Score 6 4 0  Altered sleeping 3 2 1   Tired, decreased energy 3 2 1   Change in appetite 3 2 1   Feeling bad or failure about yourself  3 2 0  Trouble concentrating 3 2 0  Moving slowly or fidgety/restless 3 0 0  Suicidal thoughts 3 0 0  PHQ-9 Score 27 14 3   Difficult doing work/chores Extremely dIfficult - -    Allergies  Allergen Reactions  . Kenalog [Triamcinolone Acetonide]     Rash noted after ankle injection by rheumatologist  . Amoxicillin Rash    Prior to Admission medications   Medication Sig Start Date End Date Taking? Authorizing Provider  escitalopram (LEXAPRO) 20 MG tablet Take 1 tablet (20 mg total) by mouth every morning. 09/15/17  Yes Colin Benton R, DO  naproxen (NAPROSYN) 500 MG tablet Take 500 mg by mouth as needed (joint pain).    Yes [provider]  hydrOXYzine (ATARAX/VISTARIL) 25 MG tablet Take 1 tablet (25 mg total) by mouth every 6 (six) hours as needed for anxiety. Patient not taking: Reported on 09/24/2017 09/12/17   Rodell Perna A, PA-C  LORazepam (ATIVAN) 1 MG tablet Take 1 tablet (1 mg total) by mouth every 8 (eight) hours as needed for anxiety. Patient not taking: Reported on 09/24/2017 09/12/17   Debroah Baller    Past Medical History:  Diagnosis Date  . Anxiety   . Arthritis   . Depression   . Frequent headaches   . History of chicken pox   . Otitis media    had tubes as child, then according to pt adipose tissue used to repair ear drum  . Rheumatoid arthritis ([place])   . UTI (lower urinary tract infection)     Past Surgical History:  Procedure Laterality Date  . APPENDECTOMY  2012  . HYMENECTOMY  2000  . TONSILECTOMY, ADENOIDECTOMY, BILATERAL MYRINGOTOMY AND TUBES      Social History   Tobacco Use  . Smoking status: Never Smoker  . Smokeless tobacco: Never Used  Substance Use Topics  . Alcohol use: Yes    Alcohol/week: 0.0 oz    Comment: occasionally     Family History  Problem Relation Age of Onset  . Arthritis Paternal  Grandmother   . Cancer Maternal Grandmother   . Cancer Maternal Grandfather   . Liver cancer Paternal Grandfather     ROS Per hpi  OBJECTIVE:  Blood pressure (!) 130/91, pulse 90, temperature 98.8 F (37.1 C), temperature source Oral, resp. rate 16, height 5' 5.75" (1.67 m), weight 232 lb 3.2 oz (105.3 kg), last menstrual period 09/20/2017, SpO2 98 %.  Physical Exam  Constitutional: She is oriented to person, place, and time. She appears well-developed and well-nourished.  HENT:  Head: Normocephalic and atraumatic.  Mouth/Throat: Mucous membranes are normal.  Eyes: Pupils are equal, round, and reactive to light. EOM are normal. No scleral icterus.  Neck: Neck supple.  Pulmonary/Chest: Effort normal.  Neurological: She is alert and oriented to person, place,  and time.  Skin: Skin is warm and dry.  Psychiatric: Her mood appears anxious.  Tapping her leg constantly tearful  Nursing note and vitals reviewed.    ASSESSMENT and PLAN  1. GAD (generalized anxiety disorder) 2. Depression, recurrent ([place]) 3. Panic disorder - TSH  Uncontrolled. Discussed treatment options, adding low dose abilify to help with agmentation of SSRI, sleep and anxiety, discussed new med r/se/b. Discussed ativan r/se/b. pmp reviewed. Discussed resources for SI. Strict ER precautions given.  Other orders - LORazepam (ATIVAN) 1 MG tablet; Take 1 tablet (1 mg total) by mouth every 8 (eight) hours as needed for anxiety. - ARIPiprazole (ABILIFY) 2 MG tablet; Take 1 tablet (2 mg total) by mouth daily.  Return in about 1 week (around 10/01/2017).    Rutherford Guys, MD Primary Care at [place], [place] [address] Ph.  [phone] Fax [phone]

## 2017-09-25 LAB — TSH: TSH: 1.18 u[IU]/mL (ref 0.450–4.500)

## 2017-09-28 ENCOUNTER — Encounter: Payer: Self-pay | Admitting: *Deleted

## 2017-09-29 ENCOUNTER — Encounter: Payer: Self-pay | Admitting: Family Medicine

## 2017-09-29 ENCOUNTER — Other Ambulatory Visit: Payer: Self-pay

## 2017-09-29 ENCOUNTER — Ambulatory Visit: Payer: Managed Care, Other (non HMO) | Admitting: Family Medicine

## 2017-09-29 VITALS — BP 122/86 | HR 77 | Temp 99.0°F | Ht 66.85 in | Wt 235.0 lb

## 2017-09-29 DIAGNOSIS — F411 Generalized anxiety disorder: Secondary | ICD-10-CM

## 2017-09-29 DIAGNOSIS — F410 Panic disorder [episodic paroxysmal anxiety] without agoraphobia: Secondary | ICD-10-CM

## 2017-09-29 DIAGNOSIS — F339 Major depressive disorder, recurrent, unspecified: Secondary | ICD-10-CM | POA: Diagnosis not present

## 2017-09-29 NOTE — Progress Notes (Signed)
6/18/20194:32 PM  Laura [name] 03/12/1986, 32 y.o. female [id number]  Chief Complaint  Patient presents with  . Follow-up    GAD  . Menorrhagia    period has been on since 09/20/17, has the paragaurd IUD, periods are usually heavy and painful    HPI:   Patient is a 32 y.o. 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 No Yes     Depression screen [facility] 2/9 09/24/2017 09/15/2017 08/13/2017  Decreased Interest 3 2 0  Down, Depressed, Hopeless 3 2 0  PHQ - 2 Score 6 4 0  Altered sleeping 3 2 1   Tired, decreased energy 3 2 1   Change in appetite 3 2 1   Feeling bad or failure about yourself  3 2 0  Trouble concentrating 3 2 0  Moving slowly or fidgety/restless 3 0 0  Suicidal thoughts 3 0 0  PHQ-9 Score 27 14 3   Difficult doing work/chores Extremely dIfficult - -    Allergies  Allergen Reactions  . Kenalog [Triamcinolone Acetonide]     Rash noted after ankle injection by rheumatologist  . Amoxicillin Rash    Prior to Admission medications   Medication Sig Start Date End Date Taking? Authorizing Provider  ARIPiprazole (ABILIFY) 2 MG tablet Take 1 tablet (2 mg total) by mouth daily. 09/24/17  Yes Rutherford Guys, MD  escitalopram (LEXAPRO) 20 MG tablet Take 1 tablet (20 mg total) by mouth every morning. 09/15/17  Yes Colin Benton R, DO  LORazepam (ATIVAN) 1 MG tablet Take 1 tablet (1 mg total) by mouth every 8 (eight)  hours as needed for anxiety. 09/24/17  Yes Rutherford Guys, MD  naproxen (NAPROSYN) 500 MG tablet Take 500 mg by mouth as needed (joint pain).    Yes [provider]    Past Medical History:  Diagnosis Date  . Anxiety   . Arthritis   . Depression   . Frequent headaches   . History of chicken pox   . Otitis media    had tubes as child, then according to pt adipose tissue used to repair ear drum  . Rheumatoid arthritis ([place])   . UTI (lower urinary tract infection)     Past Surgical History:  Procedure Laterality Date  . APPENDECTOMY  2012  . HYMENECTOMY  2000  . TONSILECTOMY, ADENOIDECTOMY, BILATERAL MYRINGOTOMY AND TUBES      Social History   Tobacco Use  . Smoking status: Never Smoker  . Smokeless tobacco: Never Used  Substance Use Topics  . Alcohol use: Yes    Alcohol/week: 0.0 oz    Comment: occasionally     Family History  Problem Relation Age of Onset  . Arthritis Paternal Grandmother   . Cancer Maternal Grandmother   . Cancer Maternal Grandfather   . Liver cancer Paternal Grandfather     ROS Per hpi  OBJECTIVE:  Blood pressure 122/86, pulse 77, temperature 99 F (37.2  C), temperature source Oral, height 5' 6.85" (1.698 m), weight 235 lb (106.6 kg), last menstrual period 09/20/2017, SpO2 95 %.  Physical Exam  Constitutional: She is oriented to person, place, and time. She appears well-developed and well-nourished.  HENT:  Head: Normocephalic and atraumatic.  Mouth/Throat: Mucous membranes are normal.  Eyes: Pupils are equal, round, and reactive to light. EOM are normal. No scleral icterus.  Neck: Neck supple.  Pulmonary/Chest: Effort normal.  Neurological: She is alert and oriented to person, place, and time.  Skin: Skin is warm and dry.  Psychiatric: She has a normal mood and affect.  Nursing note and vitals reviewed.    ASSESSMENT and PLAN  1. GAD (generalized anxiety disorder) 2. Depression, recurrent ([place]) 3. Panic  disorder Significantly improved. Tolerating meds well. Cont meds. Cont therapy. Will reassess after returning to work.   Return in about 1 week (around 10/06/2017).    Rutherford Guys, MD Primary Care at [facility], [place] [address] Ph.  [phone] Fax [phone]

## 2017-09-29 NOTE — Patient Instructions (Signed)
     IF you received an x-ray today, you will receive an invoice from [facility]. Please contact [facility] at [phone] with questions or concerns regarding your invoice.   IF you received labwork today, you will receive an invoice from [facility]. Please contact [facility] at [phone] with questions or concerns regarding your invoice.   Our billing staff will not be able to assist you with questions regarding bills from these companies.  You will be contacted with the lab results as soon as they are available. The fastest way to get your results is to activate your My Chart account. Instructions are located on the last page of this paperwork. If you have not heard from us regarding the results in 2 weeks, please contact this office.     

## 2017-10-06 ENCOUNTER — Ambulatory Visit: Payer: Managed Care, Other (non HMO) | Admitting: Family Medicine

## 2017-10-06 ENCOUNTER — Other Ambulatory Visit: Payer: Self-pay

## 2017-10-06 ENCOUNTER — Encounter: Payer: Self-pay | Admitting: Family Medicine

## 2017-10-06 VITALS — BP 112/72 | HR 68 | Temp 98.7°F | Ht 66.85 in | Wt 241.0 lb

## 2017-10-06 DIAGNOSIS — F339 Major depressive disorder, recurrent, unspecified: Secondary | ICD-10-CM

## 2017-10-06 DIAGNOSIS — F411 Generalized anxiety disorder: Secondary | ICD-10-CM | POA: Diagnosis not present

## 2017-10-06 MED ORDER — AMITRIPTYLINE HCL 25 MG PO TABS: 25.0000 mg | ORAL_TABLET | Freq: Every day | ORAL | 3 refills | Status: DC

## 2017-10-06 NOTE — Patient Instructions (Signed)
     IF you received an x-ray today, you will receive an invoice from [facility]. Please contact [facility] at [phone] with questions or concerns regarding your invoice.   IF you received labwork today, you will receive an invoice from [facility]. Please contact [facility] at [phone] with questions or concerns regarding your invoice.   Our billing staff will not be able to assist you with questions regarding bills from these companies.  You will be contacted with the lab results as soon as they are available. The fastest way to get your results is to activate your My Chart account. Instructions are located on the last page of this paperwork. If you have not heard from us regarding the results in 2 weeks, please contact this office.     

## 2017-10-06 NOTE — Progress Notes (Signed)
6/25/20194:36 PM  Laura [name] 1985-05-16, 32 y.o. female [id number]  Chief Complaint  Patient presents with  . Anxiety    Notices shakey hands after taking the medication. Having no depresson or anxiety since on the medication    HPI:   Patient is a 32 y.o. female with past medical history significant for depression and anxiety who presents today for followup  Restarted work, doing well, no depression or anxiety Has not needed any ativan She has however noticed intermittent hand tremors She denies any involuntary mouth or lip movements   Fall Risk  10/06/2017 09/29/2017 09/24/2017  Falls in the past year? No No Yes     Depression screen [facility] 2/9 10/06/2017 09/24/2017 09/15/2017  Decreased Interest 0 3 2  Down, Depressed, Hopeless 0 3 2  PHQ - 2 Score 0 6 4  Altered sleeping - 3 2  Tired, decreased energy - 3 2  Change in appetite - 3 2  Feeling bad or failure about yourself  - 3 2  Trouble concentrating - 3 2  Moving slowly or fidgety/restless - 3 0  Suicidal thoughts - 3 0  PHQ-9 Score - 27 14  Difficult doing work/chores - Extremely dIfficult -    Allergies  Allergen Reactions  . Kenalog [Triamcinolone Acetonide]     Rash noted after ankle injection by rheumatologist  . Amoxicillin Rash    Prior to Admission medications   Medication Sig Start Date End Date Taking? Authorizing Provider  ARIPiprazole (ABILIFY) 2 MG tablet Take 1 tablet (2 mg total) by mouth daily. 09/24/17   Rutherford Guys, MD  escitalopram (LEXAPRO) 20 MG tablet Take 1 tablet (20 mg total) by mouth every morning. 09/15/17   [name], DO  LORazepam (ATIVAN) 1 MG tablet Take 1 tablet (1 mg total) by mouth every 8 (eight) hours as needed for anxiety. 09/24/17   Rutherford Guys, MD  naproxen (NAPROSYN) 500 MG tablet Take 500 mg by mouth as needed (joint pain).     [provider]    Past Medical History:  Diagnosis Date  . Anxiety   . Arthritis   . Depression   . Frequent  headaches   . History of chicken pox   . Otitis media    had tubes as child, then according to pt adipose tissue used to repair ear drum  . Rheumatoid arthritis ([place])   . UTI (lower urinary tract infection)     Past Surgical History:  Procedure Laterality Date  . APPENDECTOMY  2012  . HYMENECTOMY  2000  . TONSILECTOMY, ADENOIDECTOMY, BILATERAL MYRINGOTOMY AND TUBES      Social History   Tobacco Use  . Smoking status: Never Smoker  . Smokeless tobacco: Never Used  Substance Use Topics  . Alcohol use: Yes    Alcohol/week: 0.0 oz    Comment: occasionally     Family History  Problem Relation Age of Onset  . Arthritis Paternal Grandmother   . Cancer Maternal Grandmother   . Cancer Maternal Grandfather   . Liver cancer Paternal Grandfather     ROS Per hpi  OBJECTIVE:  Blood pressure 112/72, pulse 68, temperature 98.7 F (37.1 C), temperature source Oral, height 5' 6.85" (1.698 m), weight 241 lb (109.3 kg), last menstrual period 09/20/2017, peak flow 98 L/min.  Physical Exam  Constitutional: She is oriented to person, place, and time. She appears well-developed and well-nourished.  HENT:  Head: Normocephalic and atraumatic.  Mouth/Throat: Mucous membranes are normal.  Eyes: Pupils are equal, round, and reactive to light. EOM are normal. No scleral icterus.  Neck: Neck supple.  Pulmonary/Chest: Effort normal.  Neurological: She is alert and oriented to person, place, and time. She exhibits normal muscle tone (no cogwheeling noted).  No tremors seen She does have mild abnormal movement of the tongue  Skin: Skin is warm and dry.  Psychiatric: She has a normal mood and affect.  Nursing note and vitals reviewed.    ASSESSMENT and PLAN  1. Depression, recurrent ([place]) 2. GAD (generalized anxiety disorder) Despite well controlled since adding abilify, now concerns for EPS. Therefore dc abilify, start elavil. New med r/se/b reviewed. Other orders - amitriptyline  (ELAVIL) 25 MG tablet; Take 1 tablet (25 mg total) by mouth at bedtime.  Return in about 3 weeks (around 10/27/2017).    Rutherford Guys, MD Primary Care at [place],  [address] Ph.  [phone] Fax [phone]

## 2017-10-20 ENCOUNTER — Ambulatory Visit: Payer: Managed Care, Other (non HMO) | Admitting: Family Medicine

## 2017-10-27 ENCOUNTER — Ambulatory Visit: Payer: Managed Care, Other (non HMO) | Admitting: Family Medicine

## 2017-10-27 ENCOUNTER — Encounter: Payer: Self-pay | Admitting: Family Medicine

## 2017-10-27 VITALS — BP 112/82 | HR 85 | Temp 98.7°F | Ht 66.0 in | Wt 241.2 lb

## 2017-10-27 DIAGNOSIS — F339 Major depressive disorder, recurrent, unspecified: Secondary | ICD-10-CM | POA: Diagnosis not present

## 2017-10-27 DIAGNOSIS — F410 Panic disorder [episodic paroxysmal anxiety] without agoraphobia: Secondary | ICD-10-CM

## 2017-10-27 DIAGNOSIS — F411 Generalized anxiety disorder: Secondary | ICD-10-CM

## 2017-10-27 MED ORDER — ESCITALOPRAM OXALATE 20 MG PO TABS: 20.0000 mg | ORAL_TABLET | Freq: Every morning | ORAL | 1 refills | Status: DC

## 2017-10-27 MED ORDER — AMITRIPTYLINE HCL 25 MG PO TABS: 25.0000 mg | ORAL_TABLET | Freq: Every day | ORAL | 1 refills | Status: DC

## 2017-10-27 NOTE — Patient Instructions (Signed)
     IF you received an x-ray today, you will receive an invoice from [facility]. Please contact [facility] at [phone] with questions or concerns regarding your invoice.   IF you received labwork today, you will receive an invoice from [facility]. Please contact [facility] at [phone] with questions or concerns regarding your invoice.   Our billing staff will not be able to assist you with questions regarding bills from these companies.  You will be contacted with the lab results as soon as they are available. The fastest way to get your results is to activate your My Chart account. Instructions are located on the last page of this paperwork. If you have not heard from us regarding the results in 2 weeks, please contact this office.     

## 2017-10-27 NOTE — Progress Notes (Signed)
7/16/20194:26 PM  Laura [name] 1985-12-31, 32 y.o. female [id number]  Chief Complaint  Patient presents with  . Anxiety    no anxiety of depression in the past 2 weeks. Medication is doing really well  . Depression    HPI:   Patient is a 32 y.o. 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 No No Yes     Depression screen [facility] 2/9 10/27/2017 10/06/2017 09/24/2017  Decreased Interest 0 0 3  Down, Depressed, Hopeless 0 0 3  PHQ - 2 Score 0 0 6  Altered sleeping 1 - 3  Tired, decreased energy 0 - 3  Change in appetite 0 - 3  Feeling bad or failure about yourself  0 - 3  Trouble concentrating 0 - 3  Moving slowly or fidgety/restless 0 - 3  Suicidal thoughts 0 - 3  PHQ-9 Score 1 - 27  Difficult doing work/chores - - Extremely dIfficult   GAD 7 : Generalized Anxiety Score 10/27/2017 09/24/2017  Nervous, Anxious, on Edge 0 3  Control/stop worrying 0 3  Worry too much - different things 0 3  Trouble relaxing 0 3  Restless 0 3  Easily annoyed or irritable 1 3  Afraid - awful might happen 0 3  Total GAD 7 Score 1 21  Anxiety Difficulty - Not difficult at all    Allergies  Allergen Reactions  . Kenalog [Triamcinolone Acetonide]     Rash noted after ankle injection by rheumatologist  . Amoxicillin Rash    Prior to Admission medications   Medication Sig Start Date End Date Taking? Authorizing Provider  amitriptyline (ELAVIL) 25 MG tablet Take 1 tablet (25 mg total) by mouth at bedtime. 10/06/17  Yes Rutherford Guys, MD  escitalopram (LEXAPRO) 20 MG tablet  Take 1 tablet (20 mg total) by mouth every morning. 09/15/17  Yes Colin Benton R, DO  LORazepam (ATIVAN) 1 MG tablet Take 1 tablet (1 mg total) by mouth every 8 (eight) hours as needed for anxiety. 09/24/17  Yes Rutherford Guys, MD  naproxen (NAPROSYN) 500 MG tablet Take 500 mg by mouth as needed (joint pain).    Yes [provider]    Past Medical History:  Diagnosis Date  . Anxiety   . Arthritis   . Depression   . Frequent headaches   . History of chicken pox   . Otitis media    had tubes as child, then according to pt adipose tissue used to repair ear drum  . Rheumatoid arthritis ([place])   . UTI (lower urinary tract infection)     Past Surgical History:  Procedure Laterality Date  . APPENDECTOMY  2012  . HYMENECTOMY  2000  . TONSILECTOMY, ADENOIDECTOMY, BILATERAL MYRINGOTOMY AND TUBES      Social History   Tobacco Use  . Smoking status: Never Smoker  . Smokeless tobacco: Never Used  Substance Use Topics  . Alcohol use: Yes    Alcohol/week: 0.0 oz    Comment: occasionally     Family History  Problem Relation Age of Onset  . Arthritis Paternal Grandmother   .  Cancer Maternal Grandmother   . Cancer Maternal Grandfather   . Liver cancer Paternal Grandfather     ROS Per hpi  OBJECTIVE:  Blood pressure 112/82, pulse 85, temperature 98.7 F (37.1 C), temperature source Oral, height 5\' 6"  (1.676 m), weight 241 lb 3.2 oz (109.4 kg), SpO2 97 %.  Wt Readings from Last 3 Encounters:  10/27/17 241 lb 3.2 oz (109.4 kg)  10/06/17 241 lb (109.3 kg)  09/29/17 235 lb (106.6 kg)    Physical Exam  Constitutional: She is oriented to person, place, and time. She appears well-developed and well-nourished.  HENT:  Head: Normocephalic and atraumatic.  Mouth/Throat: Mucous membranes are normal.  Eyes: Pupils are equal, round, and reactive to light. EOM are normal. No scleral icterus.  Neck: Neck supple.  Pulmonary/Chest: Effort normal.  Neurological: She is alert and  oriented to person, place, and time. She displays no tremor.  Skin: Skin is warm and dry.  Psychiatric: She has a normal mood and affect.  Nursing note and vitals reviewed.    ASSESSMENT and PLAN  1. Depression, recurrent ([place]) 2. GAD (generalized anxiety disorder) 3. Panic disorder Controlled. Continue current regime.   Other orders - amitriptyline (ELAVIL) 25 MG tablet; Take 1 tablet (25 mg total) by mouth at bedtime. - escitalopram (LEXAPRO) 20 MG tablet; Take 1 tablet (20 mg total) by mouth every morning.  Return in about 3 months (around 01/27/2018).    Rutherford Guys, MD Primary Care at [place] [facility], [place] [address] Ph.  [phone] Fax [phone]

## 2017-12-28 ENCOUNTER — Telehealth: Payer: Self-pay | Admitting: Family Medicine

## 2017-12-28 NOTE — Telephone Encounter (Signed)
Left a VM in regards to her appt with Dr. Pamella Pert on 01/28/2018. The provider will be out of the office on that day and needs to be rescheduled.

## 2018-01-28 ENCOUNTER — Ambulatory Visit: Payer: Managed Care, Other (non HMO) | Admitting: Family Medicine

## 2018-07-08 ENCOUNTER — Ambulatory Visit (INDEPENDENT_AMBULATORY_CARE_PROVIDER_SITE_OTHER): Payer: Managed Care, Other (non HMO) | Admitting: Family Medicine

## 2018-07-08 ENCOUNTER — Encounter: Payer: Self-pay | Admitting: Family Medicine

## 2018-07-08 ENCOUNTER — Other Ambulatory Visit: Payer: Self-pay

## 2018-07-08 DIAGNOSIS — F411 Generalized anxiety disorder: Secondary | ICD-10-CM

## 2018-07-08 MED ORDER — LORAZEPAM 0.5 MG PO TABS: 0.5000 mg | ORAL_TABLET | Freq: Three times a day (TID) | ORAL | 0 refills | Status: DC | PRN

## 2018-07-08 MED ORDER — AMITRIPTYLINE HCL 25 MG PO TABS: 25.0000 mg | ORAL_TABLET | Freq: Every day | ORAL | 1 refills | Status: DC

## 2018-07-08 MED ORDER — ESCITALOPRAM OXALATE 20 MG PO TABS: 20.0000 mg | ORAL_TABLET | Freq: Every morning | ORAL | 1 refills | Status: DC

## 2018-07-08 NOTE — Progress Notes (Signed)
Virtual Visit via telephone Note  I connected with patient on 07/08/18 at 406pm by telephone and verified that I am speaking with the correct person using two identifiers. Laura [name] is currently located at home and patient is currently with her during visit. The provider, Rutherford Guys, MD is located in their office at time of visit.  I discussed the limitations, risks, security and privacy concerns of performing an evaluation and management service by telephone and the availability of in person appointments. I also discussed with the patient that there may be a patient responsible charge related to this service. The patient expressed understanding and agreed to proceed.  CC: anxiety   HPI Patient with known depression and anxiety Last OV July 2019 - was doing well on lexapro 20mg , elavil 25mg  and lorazepam 1mg  prn, which per pmp review was last refilled in June 2019  Still taking lexapro 20mg  Was without amitriptyline since November Anxiety was doing well just on lexapro until recently  Had refills of amitriptyline so restarted Has been back on it 3-4 days ago, feeling better a bit today Gad 7 = 21 Was started to have some mild depression, but more related to being unable to get depression under control Denies any SI  Fall Risk  10/27/2017 10/06/2017 09/29/2017 09/24/2017  Falls in the past year? No No No Yes     Depression screen [facility] 2/9 10/27/2017 10/06/2017 09/24/2017  Decreased Interest 0 0 3  Down, Depressed, Hopeless 0 0 3  PHQ - 2 Score 0 0 6  Altered sleeping 1 - 3  Tired, decreased energy 0 - 3  Change in appetite 0 - 3  Feeling bad or failure about yourself  0 - 3  Trouble concentrating 0 - 3  Moving slowly or fidgety/restless 0 - 3  Suicidal thoughts 0 - 3  PHQ-9 Score 1 - 27  Difficult doing work/chores - - Extremely dIfficult    Allergies  Allergen Reactions  . Kenalog [Triamcinolone Acetonide]     Rash noted after ankle injection by rheumatologist   . Amoxicillin Rash    Prior to Admission medications   Medication Sig Start Date End Date Taking? Authorizing Provider  amitriptyline (ELAVIL) 25 MG tablet Take 1 tablet (25 mg total) by mouth at bedtime. 10/27/17   Rutherford Guys, MD  escitalopram (LEXAPRO) 20 MG tablet Take 1 tablet (20 mg total) by mouth every morning. 10/27/17   Rutherford Guys, MD  LORazepam (ATIVAN) 1 MG tablet Take 1 tablet (1 mg total) by mouth every 8 (eight) hours as needed for anxiety. 09/24/17   Rutherford Guys, MD  naproxen (NAPROSYN) 500 MG tablet Take 500 mg by mouth as needed (joint pain).     [provider]    Past Medical History:  Diagnosis Date  . Anxiety   . Arthritis   . Depression   . Frequent headaches   . History of chicken pox   . Otitis media    had tubes as child, then according to pt adipose tissue used to repair ear drum  . Rheumatoid arthritis ([place])   . UTI (lower urinary tract infection)     Past Surgical History:  Procedure Laterality Date  . APPENDECTOMY  2012  . HYMENECTOMY  2000  . TONSILECTOMY, ADENOIDECTOMY, BILATERAL MYRINGOTOMY AND TUBES      Social History   Tobacco Use  . Smoking status: Never Smoker  . Smokeless tobacco: Never Used  Substance Use Topics  .  Alcohol use: Yes    Alcohol/week: 0.0 standard drinks    Comment: occasionally     Family History  Problem Relation Age of Onset  . Arthritis Paternal Grandmother   . Cancer Maternal Grandmother   . Cancer Maternal Grandfather   . Liver cancer Paternal Grandfather     ROS  Per hpi  Objective  Vitals as reported by the patient: none  There were no vitals filed for this visit.  ASSESSMENT and PLAN  1. GAD (generalized anxiety disorder) Uncontrolled, mostly reactive to current situation re covid 19. Has restarted TCA. Continue lexapro. Small rx for lorazepam prn.   Other orders - escitalopram (LEXAPRO) 20 MG tablet; Take 1 tablet (20 mg total) by mouth every morning. - amitriptyline  (ELAVIL) 25 MG tablet; Take 1 tablet (25 mg total) by mouth at bedtime. - LORazepam (ATIVAN) 0.5 MG tablet; Take 1 tablet (0.5 mg total) by mouth every 8 (eight) hours as needed for anxiety.  FOLLOW-UP: 6 weeks   The above assessment and management plan was discussed with the patient. The patient verbalized understanding of and has agreed to the management plan. Patient is aware to call the clinic if symptoms persist or worsen. Patient is aware when to return to the clinic for a follow-up visit. Patient educated on when it is appropriate to go to the emergency department.    I provided 10 minutes of non-face-to-face time during this encounter.  Rutherford Guys, MD Primary Care at [facility] [place], [place] [address] Ph.  [phone] Fax [phone]

## 2018-07-12 ENCOUNTER — Ambulatory Visit: Payer: Managed Care, Other (non HMO)

## 2018-07-13 ENCOUNTER — Encounter: Payer: Self-pay | Admitting: Gastroenterology

## 2018-09-08 ENCOUNTER — Other Ambulatory Visit: Payer: Self-pay | Admitting: Family Medicine

## 2018-09-20 ENCOUNTER — Telehealth: Payer: Self-pay | Admitting: Family Medicine

## 2018-09-20 NOTE — Telephone Encounter (Signed)
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 She is wanting to pick up the form this week. She also requested for the provider to order a blood test for TB as her last screening is out of date. The form is in the provider's box.

## 2018-09-21 NOTE — Telephone Encounter (Signed)
Form is in the provider box will be handled when provider return.

## 2018-09-22 ENCOUNTER — Telehealth: Payer: Self-pay

## 2018-09-22 NOTE — Telephone Encounter (Signed)
Form given to provider by Lexine Baton

## 2018-09-22 NOTE — Telephone Encounter (Signed)
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

## 2018-09-22 NOTE — Telephone Encounter (Signed)
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

## 2018-09-22 NOTE — Telephone Encounter (Signed)
Have you seen this form? If not can you please track down. thanks

## 2018-09-23 ENCOUNTER — Telehealth: Payer: Self-pay

## 2018-09-23 ENCOUNTER — Other Ambulatory Visit: Payer: Self-pay

## 2018-09-23 ENCOUNTER — Ambulatory Visit (INDEPENDENT_AMBULATORY_CARE_PROVIDER_SITE_OTHER): Payer: Managed Care, Other (non HMO) | Admitting: Family Medicine

## 2018-09-23 DIAGNOSIS — Z111 Encounter for screening for respiratory tuberculosis: Secondary | ICD-10-CM

## 2018-09-23 DIAGNOSIS — Z23 Encounter for immunization: Secondary | ICD-10-CM

## 2018-09-23 NOTE — Telephone Encounter (Signed)
Pt came to pick up the form for school and to have a quantiferon gold drawn

## 2018-09-23 NOTE — Telephone Encounter (Signed)
Pt has picked up the form

## 2018-09-27 LAB — QUANTIFERON-TB GOLD PLUS
QuantiFERON Mitogen Value: 8.73 IU/mL
QuantiFERON Nil Value: 0.02 IU/mL
QuantiFERON TB1 Ag Value: 0.02 IU/mL
QuantiFERON TB2 Ag Value: 0.03 IU/mL
QuantiFERON-TB Gold Plus: NEGATIVE

## 2018-11-18 ENCOUNTER — Encounter: Payer: Self-pay | Admitting: Family Medicine

## 2018-11-18 ENCOUNTER — Ambulatory Visit: Payer: Managed Care, Other (non HMO) | Admitting: Family Medicine

## 2018-11-18 ENCOUNTER — Telehealth: Payer: Self-pay

## 2018-11-18 ENCOUNTER — Other Ambulatory Visit: Payer: Self-pay

## 2018-11-18 VITALS — BP 119/83 | HR 88 | Temp 98.3°F | Ht 66.0 in | Wt 256.6 lb

## 2018-11-18 DIAGNOSIS — R7303 Prediabetes: Secondary | ICD-10-CM | POA: Diagnosis not present

## 2018-11-18 DIAGNOSIS — N926 Irregular menstruation, unspecified: Secondary | ICD-10-CM

## 2018-11-18 DIAGNOSIS — R5383 Other fatigue: Secondary | ICD-10-CM | POA: Diagnosis not present

## 2018-11-18 DIAGNOSIS — F330 Major depressive disorder, recurrent, mild: Secondary | ICD-10-CM

## 2018-11-18 MED ORDER — ESCITALOPRAM OXALATE 20 MG PO TABS: 20.0000 mg | ORAL_TABLET | Freq: Every morning | ORAL | 1 refills | Status: DC

## 2018-11-18 MED ORDER — DEXAMETHASONE 1 MG PO TABS: 1.0000 mg | ORAL_TABLET | Freq: Every day | ORAL | 0 refills | Status: DC

## 2018-11-18 NOTE — Progress Notes (Signed)
8/6/202011:43 AM  Laura [name] [birth year], 33 y.o., female [id number]  Chief Complaint  Patient presents with  . Anxiety    Gad 7 complete  . Depression    Phq 9 compete  . Menstrual Problem    having irregular periods, lmp 7/17 before that was may. periods are very heavy  with clotting and painful. Current iud placed. GYN is booked until mid Sept.    HPI:   Patient is a 33 y.o. 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 She is concerned regarding fat bump in back of her neck Having increased abd girth with purple stretch marks Very fatigued, 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) Paternal GF diabetes and RA Paternal GM RA Mother and maternal uncle, afib, lipid, htn  GAD 7 : Generalized Anxiety Score 11/18/2018 10/27/2017 09/24/2017  Nervous, Anxious, on Edge 0 0 3  Control/stop worrying 0 0 3  Worry too much - different things 0 0 3  Trouble relaxing 1 0 3  Restless 0 0 3  Easily annoyed or irritable 1 1 3   Afraid - awful might happen 0 0 3  Total GAD 7 Score 2 1 21   Anxiety Difficulty Not difficult at all - Not difficult at all     Depression screen [facility] 2/9 11/18/2018 10/27/2017 10/06/2017  Decreased Interest 1 0 0  Down, Depressed, Hopeless 0 0 0  PHQ - 2 Score 1 0 0  Altered sleeping 1 1 -  Tired, decreased energy 3 0 -  Change in appetite 0 0 -  Feeling bad or failure about yourself  0 0 -  Trouble concentrating 0 0 -  Moving slowly or fidgety/restless 0 0 -  Suicidal thoughts 0  0 -  PHQ-9 Score 5 1 -  Difficult doing work/chores Somewhat difficult - -    Fall Risk  11/18/2018 10/27/2017 10/06/2017 09/29/2017 09/24/2017  Falls in the past year? 0 No No No Yes  Number falls in past yr: 0 - - - -  Injury with Fall? 0 - - - -     Allergies  Allergen Reactions  . Kenalog [Triamcinolone Acetonide]     Rash noted after ankle injection by rheumatologist  . Amoxicillin Rash    Prior to Admission medications   Medication Sig Start Date End Date Taking? Authorizing Provider  amitriptyline (ELAVIL) 25 MG tablet TAKE 1 TABLET(25 MG) BY MOUTH AT BEDTIME 09/08/18  Yes Rutherford Guys, MD  escitalopram (LEXAPRO) 20 MG tablet Take 1 tablet (20 mg total) by mouth every morning. 07/08/18  Yes Rutherford Guys, MD  LORazepam (ATIVAN) 0.5 MG tablet Take 1 tablet (0.5 mg total) by mouth every 8 (eight) hours as needed for anxiety. 07/08/18  Yes Rutherford Guys, MD    Past Medical History:  Diagnosis Date  . Anxiety   . Arthritis   . Depression   . Frequent headaches   . History of chicken pox   . Otitis media    had tubes as child, then  according to pt adipose tissue used to repair ear drum  . Rheumatoid arthritis ([place])   . UTI (lower urinary tract infection)     Past Surgical History:  Procedure Laterality Date  . APPENDECTOMY  2012  . HYMENECTOMY  2000  . TONSILECTOMY, ADENOIDECTOMY, BILATERAL MYRINGOTOMY AND TUBES      Social History   Tobacco Use  . Smoking status: Never Smoker  . Smokeless tobacco: Never Used  Substance Use Topics  . Alcohol use: Yes    Alcohol/week: 0.0 standard drinks    Comment: occasionally     Family History  Problem Relation Age of Onset  . Arthritis Paternal Grandmother   . Cancer Maternal Grandmother   . Cancer Maternal Grandfather   . Liver cancer Paternal Grandfather     ROS Per hpi  OBJECTIVE:  Today's Vitals   11/18/18 1111  BP: 119/83  Pulse: 88  Temp: 98.3 F (36.8 C)  TempSrc: Oral  SpO2: 95%  Weight: 256  lb 9.6 oz (116.4 kg)  Height: 5\' 6"  (1.676 m)   Body mass index is 41.42 kg/m.  Wt Readings from Last 3 Encounters:  11/18/18 256 lb 9.6 oz (116.4 kg)  10/27/17 241 lb 3.2 oz (109.4 kg)  10/06/17 241 lb (109.3 kg)    Physical Exam Vitals signs and nursing note reviewed.  Constitutional:      Appearance: She is well-developed.  HENT:     Head: Normocephalic and atraumatic.     Mouth/Throat:     Pharynx: No oropharyngeal exudate.  Eyes:     General: No scleral icterus.    Conjunctiva/sclera: Conjunctivae normal.     Pupils: Pupils are equal, round, and reactive to light.  Neck:     Musculoskeletal: Neck supple.  Cardiovascular:     Rate and Rhythm: Normal rate and regular rhythm.     Heart sounds: Normal heart sounds. No murmur. No friction rub. No gallop.   Pulmonary:     Effort: Pulmonary effort is normal.     Breath sounds: Normal breath sounds. No wheezing or rales.  Skin:    General: Skin is warm and dry.  Neurological:     Mental Status: She is alert and oriented to person, place, and time.   central obesity Mild cervical fat pad    ASSESSMENT and PLAN  1. Irregular periods - Hemoglobin A1c; Future - Cortisol-am, blood; Future - suppression test - Hormone Panel; Future - CBC with Differential/Platelet; Future  2. Prediabetes - Hemoglobin A1c; Future - Cortisol-am, blood; Future  3. Fatigue, unspecified type - Comprehensive metabolic panel; Future - VITAMIN D 25 Hydroxy (Vit-D Deficiency, Fractures); Future - Cortisol-am, blood; Future  4. Mild episode of recurrent major depressive disorder (HCC) Well controlled. D/c amitriptyline as might be contributing to weight gain.   Other orders - escitalopram (LEXAPRO) 20 MG tablet; Take 1 tablet (20 mg total) by mouth every morning. - dexamethasone (DECADRON) 1 MG tablet; Take 1 tablet (1 mg total) by mouth at bedtime.  Return in about 4 weeks (around 12/16/2018).    Rutherford Guys, MD Primary Care at  [facility] [place], [place] [address] Ph.  [phone] Fax [phone]

## 2018-11-18 NOTE — Telephone Encounter (Signed)
Pt was given rx with lab orders on it for draw at [facility] tomorrow.

## 2018-11-18 NOTE — Patient Instructions (Signed)
° ° ° °  If you have lab work done today you will be contacted with your lab results within the next 2 weeks.  If you have not heard from us then please contact us. The fastest way to get your results is to register for My Chart. ° ° °IF you received an x-ray today, you will receive an invoice from [facility]. Please contact [facility] at [phone] with questions or concerns regarding your invoice.  ° °IF you received labwork today, you will receive an invoice from [facility]. Please contact [facility] at [phone] with questions or concerns regarding your invoice.  ° °Our billing staff will not be able to assist you with questions regarding bills from these companies. ° °You will be contacted with the lab results as soon as they are available. The fastest way to get your results is to activate your My Chart account. Instructions are located on the last page of this paperwork. If you have not heard from us regarding the results in 2 weeks, please contact this office. °  ° ° ° °

## 2018-11-19 ENCOUNTER — Other Ambulatory Visit: Payer: Self-pay | Admitting: Family Medicine

## 2018-11-19 DIAGNOSIS — D72828 Other elevated white blood cell count: Secondary | ICD-10-CM

## 2018-11-19 DIAGNOSIS — R748 Abnormal levels of other serum enzymes: Secondary | ICD-10-CM

## 2018-11-19 DIAGNOSIS — R718 Other abnormality of red blood cells: Secondary | ICD-10-CM

## 2018-11-19 DIAGNOSIS — M1230 Palindromic rheumatism, unspecified site: Secondary | ICD-10-CM

## 2018-11-30 LAB — CBC WITH DIFFERENTIAL/PLATELET
Basophils Absolute: 0.1 10*3/uL (ref 0.0–0.2)
Basos: 1 %
EOS (ABSOLUTE): 0.1 10*3/uL (ref 0.0–0.4)
Eos: 1 %
Hematocrit: 37.3 % (ref 34.0–46.6)
Hemoglobin: 11.2 g/dL (ref 11.1–15.9)
Immature Grans (Abs): 0.2 10*3/uL — ABNORMAL HIGH (ref 0.0–0.1)
Immature Granulocytes: 1 %
Lymphocytes Absolute: 2.4 10*3/uL (ref 0.7–3.1)
Lymphs: 16 %
MCH: 22.1 pg — ABNORMAL LOW (ref 26.6–33.0)
MCHC: 30 g/dL — ABNORMAL LOW (ref 31.5–35.7)
MCV: 74 fL — ABNORMAL LOW (ref 79–97)
Monocytes Absolute: 0.8 10*3/uL (ref 0.1–0.9)
Monocytes: 5 %
Neutrophils Absolute: 11.7 10*3/uL — ABNORMAL HIGH (ref 1.4–7.0)
Neutrophils: 76 %
Platelets: 449 10*3/uL (ref 150–450)
RBC: 5.06 x10E6/uL (ref 3.77–5.28)
RDW: 14.1 % (ref 11.7–15.4)
WBC: 15.1 10*3/uL — ABNORMAL HIGH (ref 3.4–10.8)

## 2018-11-30 LAB — HORMONE PANEL (T4,TSH,FSH,TESTT,SHBG,DHEA,ETC)
DHEA-Sulfate, LCMS: 35 ug/dL
Estradiol, Serum, MS: 86 pg/mL
Estrone Sulfate: 196 ng/dL
Follicle Stimulating Hormone: 7.6 m[IU]/mL
Free T-3: 3 pg/mL
Free Testosterone, Serum: 3.2 pg/mL
Progesterone, Serum: <10 ng/dL
Sex Hormone Binding Globulin: 46.1 nmol/L
T4: 7.2 ug/dL
TSH: 0.97 uU/mL
Testosterone, Serum (Total): 32 ng/dL
Testosterone-% Free: 1 %
Triiodothyronine (T-3), Serum: 134 ng/dL

## 2018-11-30 LAB — COMPREHENSIVE METABOLIC PANEL
ALT: 52 IU/L — ABNORMAL HIGH (ref 0–32)
AST: 30 IU/L (ref 0–40)
Albumin/Globulin Ratio: 1.4 (ref 1.2–2.2)
Albumin: 4.2 g/dL (ref 3.8–4.8)
Alkaline Phosphatase: 119 IU/L — ABNORMAL HIGH (ref 39–117)
BUN/Creatinine Ratio: 12 (ref 9–23)
BUN: 10 mg/dL (ref 6–20)
Bilirubin Total: 0.2 mg/dL (ref 0.0–1.2)
CO2: 24 mmol/L (ref 20–29)
Calcium: 9.2 mg/dL (ref 8.7–10.2)
Chloride: 102 mmol/L (ref 96–106)
Creatinine, Ser: 0.84 mg/dL (ref 0.57–1.00)
GFR calc Af Amer: 106 mL/min/{1.73_m2} (ref >59)
GFR calc non Af Amer: 92 mL/min/{1.73_m2} (ref >59)
Globulin, Total: 2.9 g/dL (ref 1.5–4.5)
Glucose: 92 mg/dL (ref 65–99)
Potassium: 4.4 mmol/L (ref 3.5–5.2)
Sodium: 137 mmol/L (ref 134–144)
Total Protein: 7.1 g/dL (ref 6.0–8.5)

## 2018-11-30 LAB — CORTISOL-AM, BLOOD: Cortisol - AM: 0.7 ug/dL — ABNORMAL LOW (ref 6.2–19.4)

## 2018-11-30 LAB — HGB A1C W/O EAG: Hgb A1c MFr Bld: 5.9 % — ABNORMAL HIGH (ref 4.8–5.6)

## 2018-11-30 LAB — SPECIMEN STATUS REPORT

## 2018-11-30 LAB — VITAMIN D 25 HYDROXY (VIT D DEFICIENCY, FRACTURES): Vit D, 25-Hydroxy: 12.7 ng/mL — ABNORMAL LOW (ref 30.0–100.0)

## 2018-12-06 MED ORDER — VITAMIN D (ERGOCALCIFEROL) 1.25 MG (50000 UNIT) PO CAPS: 50000.0000 [IU] | ORAL_CAPSULE | ORAL | 1 refills | Status: DC

## 2018-12-06 NOTE — Addendum Note (Signed)
Addended by: Rutherford Guys on: 12/06/2018 01:16 PM   Modules accepted: Orders

## 2018-12-16 ENCOUNTER — Encounter: Payer: Self-pay | Admitting: Family Medicine

## 2018-12-16 ENCOUNTER — Other Ambulatory Visit: Payer: Self-pay

## 2018-12-16 ENCOUNTER — Ambulatory Visit: Payer: Managed Care, Other (non HMO) | Admitting: Family Medicine

## 2018-12-16 VITALS — BP 122/84 | HR 75 | Temp 98.7°F | Ht 66.0 in | Wt 247.0 lb

## 2018-12-16 DIAGNOSIS — N926 Irregular menstruation, unspecified: Secondary | ICD-10-CM

## 2018-12-16 DIAGNOSIS — R748 Abnormal levels of other serum enzymes: Secondary | ICD-10-CM

## 2018-12-16 DIAGNOSIS — R7303 Prediabetes: Secondary | ICD-10-CM | POA: Diagnosis not present

## 2018-12-16 DIAGNOSIS — E559 Vitamin D deficiency, unspecified: Secondary | ICD-10-CM

## 2018-12-16 DIAGNOSIS — M1230 Palindromic rheumatism, unspecified site: Secondary | ICD-10-CM

## 2018-12-16 DIAGNOSIS — F411 Generalized anxiety disorder: Secondary | ICD-10-CM

## 2018-12-16 DIAGNOSIS — R718 Other abnormality of red blood cells: Secondary | ICD-10-CM

## 2018-12-16 DIAGNOSIS — D72828 Other elevated white blood cell count: Secondary | ICD-10-CM

## 2018-12-16 NOTE — Patient Instructions (Signed)
° ° ° °  If you have lab work done today you will be contacted with your lab results within the next 2 weeks.  If you have not heard from us then please contact us. The fastest way to get your results is to register for My Chart. ° ° °IF you received an x-ray today, you will receive an invoice from [facility]. Please contact [facility] at [phone] with questions or concerns regarding your invoice.  ° °IF you received labwork today, you will receive an invoice from [facility]. Please contact [facility] at [phone] with questions or concerns regarding your invoice.  ° °Our billing staff will not be able to assist you with questions regarding bills from these companies. ° °You will be contacted with the lab results as soon as they are available. The fastest way to get your results is to activate your My Chart account. Instructions are located on the last page of this paperwork. If you have not heard from us regarding the results in 2 weeks, please contact this office. °  ° ° ° °

## 2018-12-16 NOTE — Progress Notes (Signed)
9/3/202011:59 AM  Laura [name] 17-Oct-1985, 33 y.o., female [id number]  Chief Complaint  Patient presents with  . Follow-up    irregular menses    HPI:   Patient is a 33 y.o. female with past medical history significant for prediabetes, depression and anxiety who presents today for routine followup  Last OV Aug 2020 D/c amitriptyline as might be contributing to weight gain Labs reviewed Started high dose vitamin D paraguard IUD, thinking about having it removed, sees obgyn later this month Otherwise anxiety a bit higher since d/c TCA - does not want to adjust meds any further at this point  Depression screen [facility] 2/9 12/16/2018 11/18/2018 10/27/2017  Decreased Interest 0 1 0  Down, Depressed, Hopeless 0 0 0  PHQ - 2 Score 0 1 0  Altered sleeping - 1 1  Tired, decreased energy - 3 0  Change in appetite - 0 0  Feeling bad or failure about yourself  - 0 0  Trouble concentrating - 0 0  Moving slowly or fidgety/restless - 0 0  Suicidal thoughts - 0 0  PHQ-9 Score - 5 1  Difficult doing work/chores - Somewhat difficult -    Fall Risk  12/16/2018 11/18/2018 10/27/2017 10/06/2017 09/29/2017  Falls in the past year? 0 0 No No No  Number falls in past yr: 0 0 - - -  Injury with Fall? 0 0 - - -     Allergies  Allergen Reactions  . Kenalog [Triamcinolone Acetonide]     Rash noted after ankle injection by rheumatologist  . Amoxicillin Rash    Prior to Admission medications   Medication Sig Start Date End Date Taking? Authorizing Provider  escitalopram (LEXAPRO) 20 MG tablet Take 1 tablet (20 mg total) by mouth every morning. 11/18/18  Yes Rutherford Guys, MD  LORazepam (ATIVAN) 0.5 MG tablet Take 1 tablet (0.5 mg total) by mouth every 8 (eight) hours as needed for anxiety. 07/08/18  Yes Rutherford Guys, MD  Vitamin D, Ergocalciferol, (DRISDOL) 1.25 MG (50000 UT) CAPS capsule Take 1 capsule (50,000 Units total) by mouth every 7 (seven) days. 12/06/18  Yes Rutherford Guys, MD     Past Medical History:  Diagnosis Date  . Anxiety   . Arthritis   . Depression   . Frequent headaches   . History of chicken pox   . Otitis media    had tubes as child, then according to pt adipose tissue used to repair ear drum  . Rheumatoid arthritis ([place])   . UTI (lower urinary tract infection)     Past Surgical History:  Procedure Laterality Date  . APPENDECTOMY  2012  . HYMENECTOMY  2000  . TONSILECTOMY, ADENOIDECTOMY, BILATERAL MYRINGOTOMY AND TUBES      Social History   Tobacco Use  . Smoking status: Never Smoker  . Smokeless tobacco: Never Used  Substance Use Topics  . Alcohol use: Yes    Alcohol/week: 0.0 standard drinks    Comment: occasionally     Family History  Problem Relation Age of Onset  . Arthritis Paternal Grandmother   . Cancer Maternal Grandmother   . Cancer Maternal Grandfather   . Liver cancer Paternal Grandfather     ROS Per hpi  OBJECTIVE:  Today's Vitals   12/16/18 1155  BP: 122/84  Pulse: 75  Temp: 98.7 F (37.1 C)  SpO2: 95%  Weight: 247 lb (112 kg)  Height: 5\' 6"  (1.676 m)   Body mass index is 39.87  kg/m.   Wt Readings from Last 3 Encounters:  12/16/18 247 lb (112 kg)  11/18/18 256 lb 9.6 oz (116.4 kg)  10/27/17 241 lb 3.2 oz (109.4 kg)     Physical Exam Vitals signs and nursing note reviewed.  Constitutional:      Appearance: She is well-developed.  HENT:     Head: Normocephalic and atraumatic.  Eyes:     General: No scleral icterus.    Conjunctiva/sclera: Conjunctivae normal.     Pupils: Pupils are equal, round, and reactive to light.  Neck:     Musculoskeletal: Neck supple.  Pulmonary:     Effort: Pulmonary effort is normal.  Skin:    General: Skin is warm and dry.  Neurological:     Mental Status: She is alert and oriented to person, place, and time.       No results found for this or any previous visit (from the past 24 hour(s)).  No results found.   ASSESSMENT and PLAN  1. GAD (generalized  anxiety disorder) Slightly worse since d/c TCA. Patient does not wish to adjust meds at this time. In future consider augmenting with buspar if needed. Takes lorazepam very prn.  - TSH  2. Prediabetes New diagnosis, discussed LFM. Recheck labs in 6-12 months.  3. Vitamin D deficiency New diagnosis. On high dose vitamin D. Patient instructed to start OTC D3 2,000 units once rx completed.  4. Irregular periods paraguard in place. Has upcoming appt with gyn  5. Abnormal liver enzymes - Hepatitis B surface antigen - Hepatitis C antibody - ANA w/Reflex if Positive - Iron, TIBC and Ferritin Panel  6. 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 Chronic neutrophilia - Pathologist smear review  8. Microcytosis - Pathologist smear review - Iron, TIBC and Ferritin Panel    Return in about 6 months (around 06/15/2019).    Rutherford Guys, MD Primary Care at [place], [place] [address] Ph.  [phone] Fax [phone]

## 2018-12-17 LAB — PATHOLOGIST SMEAR REVIEW
Basophils Absolute: 0.1 10*3/uL (ref 0.0–0.2)
Basos: 1 %
EOS (ABSOLUTE): 0.1 10*3/uL (ref 0.0–0.4)
Eos: 1 %
Hematocrit: 42.5 % (ref 34.0–46.6)
Hemoglobin: 12.1 g/dL (ref 11.1–15.9)
Immature Grans (Abs): 0 10*3/uL (ref 0.0–0.1)
Immature Granulocytes: 0 %
Lymphocytes Absolute: 1.9 10*3/uL (ref 0.7–3.1)
Lymphs: 21 %
MCH: 21.5 pg — ABNORMAL LOW (ref 26.6–33.0)
MCHC: 28.5 g/dL — ABNORMAL LOW (ref 31.5–35.7)
MCV: 76 fL — ABNORMAL LOW (ref 79–97)
Monocytes Absolute: 0.7 10*3/uL (ref 0.1–0.9)
Monocytes: 8 %
Neutrophils Absolute: 6.2 10*3/uL (ref 1.4–7.0)
Neutrophils: 69 %
Platelets: 422 10*3/uL (ref 150–450)
RBC: 5.62 x10E6/uL — ABNORMAL HIGH (ref 3.77–5.28)
RDW: 14.9 % (ref 11.7–15.4)
WBC: 9 10*3/uL (ref 3.4–10.8)

## 2018-12-17 LAB — HEPATITIS B SURFACE ANTIGEN: Hepatitis B Surface Ag: NEGATIVE

## 2018-12-17 LAB — C-REACTIVE PROTEIN: CRP: 9 mg/L (ref 0–10)

## 2018-12-17 LAB — SEDIMENTATION RATE: Sed Rate: 36 mm/hr — ABNORMAL HIGH (ref 0–32)

## 2018-12-17 LAB — IRON,TIBC AND FERRITIN PANEL
Ferritin: 11 ng/mL — ABNORMAL LOW (ref 15–150)
Iron Saturation: 5 % — CL (ref 15–55)
Iron: 23 ug/dL — ABNORMAL LOW (ref 27–159)
Total Iron Binding Capacity: 482 ug/dL — ABNORMAL HIGH (ref 250–450)
UIBC: 459 ug/dL — ABNORMAL HIGH (ref 131–425)

## 2018-12-17 LAB — ANA W/REFLEX IF POSITIVE: Anti Nuclear Antibody (ANA): NEGATIVE

## 2018-12-17 LAB — RHEUMATOID FACTOR: Rheumatoid fact SerPl-aCnc: 31.9 IU/mL — ABNORMAL HIGH (ref 0.0–13.9)

## 2018-12-17 LAB — HEPATITIS C ANTIBODY: Hep C Virus Ab: <0.1 s/co ratio (ref 0.0–0.9)

## 2018-12-17 LAB — TSH: TSH: 1.49 u[IU]/mL (ref 0.450–4.500)

## 2018-12-27 ENCOUNTER — Telehealth: Payer: Self-pay | Admitting: Family Medicine

## 2018-12-27 NOTE — Telephone Encounter (Signed)
Pt ha seen lab results and would like a call to see what the next step is and to get a better understanding of her results and what they mean/ please advise

## 2018-12-28 ENCOUNTER — Encounter: Payer: Self-pay | Admitting: Family Medicine

## 2018-12-30 ENCOUNTER — Encounter: Payer: Self-pay | Admitting: Family Medicine

## 2018-12-30 MED ORDER — FERROUS GLUCONATE 324 (38 FE) MG PO TABS: 324.0000 mg | ORAL_TABLET | Freq: Every day | ORAL | 0 refills | Status: DC

## 2018-12-30 NOTE — Addendum Note (Signed)
Addended by: Rutherford Guys on: 12/30/2018 12:56 PM   Modules accepted: Orders

## 2018-12-31 DIAGNOSIS — F419 Anxiety disorder, unspecified: Secondary | ICD-10-CM

## 2018-12-31 HISTORY — DX: Anxiety disorder, unspecified: F41.9

## 2019-01-03 NOTE — Telephone Encounter (Signed)
Pt was advised of labs by provider over the mychart.

## 2019-01-06 ENCOUNTER — Encounter: Payer: Self-pay | Admitting: Family Medicine

## 2019-01-06 DIAGNOSIS — D649 Anemia, unspecified: Secondary | ICD-10-CM

## 2019-01-10 ENCOUNTER — Other Ambulatory Visit: Payer: Self-pay

## 2019-01-10 ENCOUNTER — Ambulatory Visit (INDEPENDENT_AMBULATORY_CARE_PROVIDER_SITE_OTHER): Payer: Managed Care, Other (non HMO) | Admitting: Family Medicine

## 2019-01-10 DIAGNOSIS — D649 Anemia, unspecified: Secondary | ICD-10-CM

## 2019-01-13 LAB — HEMOGLOBINOPATHY EVALUATION
HGB C: 0 %
HGB S: 0 %
HGB VARIANT: 0 %
Hemoglobin A2 Quantitation: 1.7 % — ABNORMAL LOW (ref 1.8–3.2)
Hemoglobin F Quantitation: 0 % (ref 0.0–2.0)
Hgb A: 98.3 % (ref 96.4–98.8)

## 2019-02-28 ENCOUNTER — Telehealth: Payer: Self-pay | Admitting: Family Medicine

## 2019-02-28 NOTE — Telephone Encounter (Signed)
LVM to r/s appt on 06/15/2018 with Dr. Pamella Pert. Provider out of the office on that day

## 2019-03-30 DIAGNOSIS — E282 Polycystic ovarian syndrome: Secondary | ICD-10-CM | POA: Insufficient documentation

## 2019-05-28 ENCOUNTER — Ambulatory Visit: Payer: Managed Care, Other (non HMO) | Attending: Internal Medicine

## 2019-05-28 DIAGNOSIS — Z23 Encounter for immunization: Secondary | ICD-10-CM | POA: Insufficient documentation

## 2019-05-28 NOTE — Progress Notes (Signed)
   Covid-19 Vaccination Clinic  Name:  Laura [name]    MRN: [id number] DOB: [birth year]  05/28/2019  [name] was observed post Covid-19 immunization for 15 minutes without incidence. She was provided with Vaccine Information Sheet and instruction to access the V-Safe system.   [name] was instructed to call 911 with any severe reactions post vaccine: Marland Kitchen Difficulty breathing  . Swelling of your face and throat  . A fast heartbeat  . A bad rash all over your body  . Dizziness and weakness    Immunizations Administered    Name Date Dose VIS Date Route   Pfizer COVID-19 Vaccine 05/28/2019  2:19 PM 0.3 mL 03/25/2019 Intramuscular   Manufacturer: [place]   Lot: [id number]   [place]: [id number]

## 2019-06-15 ENCOUNTER — Ambulatory Visit: Payer: Managed Care, Other (non HMO) | Admitting: Family Medicine

## 2019-06-20 ENCOUNTER — Ambulatory Visit: Payer: Managed Care, Other (non HMO) | Attending: Internal Medicine

## 2019-06-20 DIAGNOSIS — Z23 Encounter for immunization: Secondary | ICD-10-CM

## 2019-06-20 NOTE — Progress Notes (Signed)
   Covid-19 Vaccination Clinic  Name:  Laura [name]    MRN: [id number] DOB: Jan 09, 1986  06/20/2019  Laura [name] was observed post Covid-19 immunization for 15 minutes without incident. She was provided with Vaccine Information Sheet and instruction to access the V-Safe system.   Laura [name] was instructed to call 911 with any severe reactions post vaccine: Marland Kitchen Difficulty breathing  . Swelling of face and throat  . A fast heartbeat  . A bad rash all over body  . Dizziness and weakness   Immunizations Administered    Name Date Dose VIS Date Route   Pfizer COVID-19 Vaccine 06/20/2019 12:23 PM 0.3 mL 03/25/2019 Intramuscular   Manufacturer: [place]   Lot: [id number]   [place]: [id number]

## 2020-01-07 ENCOUNTER — Other Ambulatory Visit: Payer: Self-pay | Admitting: Family Medicine

## 2020-01-07 DIAGNOSIS — F411 Generalized anxiety disorder: Secondary | ICD-10-CM

## 2020-01-07 NOTE — Telephone Encounter (Signed)
Requested medication (s) are due for refill today: yes  Requested medication (s) are on the active medication list: yes  Last refill:  11/18/18  Future visit scheduled: no  Notes to clinic:  Pt needs appt.   Requested Prescriptions  Pending Prescriptions Disp Refills   escitalopram (LEXAPRO) 20 MG tablet [Pharmacy Med Name: ESCITALOPRAM 20MG  TABLETS] 90 tablet 1    Sig: TAKE 1 TABLET(20 MG) BY MOUTH EVERY MORNING      Psychiatry:  Antidepressants - SSRI Failed - 01/07/2020  2:19 PM      Failed - Completed PHQ-2 or PHQ-9 in the last 360 days.      Failed - Valid encounter within last 6 months    Recent Outpatient Visits           12 months ago Anemia, unspecified type   [facility] at [facility], Lilia Argue, MD   1 year ago GAD (generalized anxiety disorder)   [facility] at [facility], Lilia Argue, MD   1 year ago Irregular periods   [facility] at [facility], Lilia Argue, MD   1 year ago Screening for tuberculosis   [facility] at [facility], Lilia Argue, MD   1 year ago GAD (generalized anxiety disorder)   Primary [facility] at [facility], Lilia Argue, MD

## 2020-01-13 ENCOUNTER — Ambulatory Visit (INDEPENDENT_AMBULATORY_CARE_PROVIDER_SITE_OTHER): Payer: Managed Care, Other (non HMO) | Admitting: Family Medicine

## 2020-01-13 ENCOUNTER — Encounter: Payer: Self-pay | Admitting: Family Medicine

## 2020-01-13 ENCOUNTER — Other Ambulatory Visit: Payer: Self-pay

## 2020-01-13 VITALS — BP 126/86 | HR 82 | Temp 97.9°F | Ht 66.0 in | Wt 260.4 lb

## 2020-01-13 DIAGNOSIS — F411 Generalized anxiety disorder: Secondary | ICD-10-CM

## 2020-01-13 DIAGNOSIS — Z0000 Encounter for general adult medical examination without abnormal findings: Secondary | ICD-10-CM

## 2020-01-13 DIAGNOSIS — R7303 Prediabetes: Secondary | ICD-10-CM | POA: Diagnosis not present

## 2020-01-13 DIAGNOSIS — M1230 Palindromic rheumatism, unspecified site: Secondary | ICD-10-CM

## 2020-01-13 DIAGNOSIS — Z862 Personal history of diseases of the blood and blood-forming organs and certain disorders involving the immune mechanism: Secondary | ICD-10-CM

## 2020-01-13 DIAGNOSIS — E559 Vitamin D deficiency, unspecified: Secondary | ICD-10-CM

## 2020-01-13 DIAGNOSIS — Z86010 Personal history of colonic polyps: Secondary | ICD-10-CM

## 2020-01-13 DIAGNOSIS — Z0001 Encounter for general adult medical examination with abnormal findings: Secondary | ICD-10-CM

## 2020-01-13 MED ORDER — ESCITALOPRAM OXALATE 20 MG PO TABS: 20.0000 mg | ORAL_TABLET | Freq: Every day | ORAL | 3 refills | Status: DC

## 2020-01-13 NOTE — Patient Instructions (Addendum)
If you have lab work done today you will be contacted with your lab results within the next 2 weeks.  If you have not heard from [place] then please contact us. The fastest way to get your results is to register for My Chart.   IF you received an x-ray today, you will receive an invoice from [facility]. Please contact [facility] at [phone] with questions or concerns regarding your invoice.   IF you received labwork today, you will receive an invoice from [place]. Please contact [facility] at [phone] with questions or concerns regarding your invoice.   Our billing staff will not be able to assist you with questions regarding bills from these companies.  You will be contacted with the lab results as soon as they are available. The fastest way to get your results is to activate your My Chart account. Instructions are located on the last page of this paperwork. If you have not heard from [place] regarding the results in 2 weeks, please contact this office.      Preventive Care 42-47 Years Old, Female Preventive care refers to visits with your health care provider and lifestyle choices that can promote health and wellness. This includes:  A yearly physical exam. This may also be called an annual well check.  Regular dental visits and eye exams.  Immunizations.  Screening for certain conditions.  Healthy lifestyle choices, such as eating a healthy diet, getting regular exercise, not using drugs or products that contain nicotine and tobacco, and limiting alcohol use. What can I expect for my preventive care visit? Physical exam Your health care provider will check your:  Height and weight. This may be used to calculate body mass index (BMI), which tells if you are at a healthy weight.  Heart rate and blood pressure.  Skin for abnormal spots. Counseling Your health care provider may ask you questions about your:  Alcohol, tobacco, and drug use.  Emotional  well-being.  Home and relationship well-being.  Sexual activity.  Eating habits.  Work and work Statistician.  Method of birth control.  Menstrual cycle.  Pregnancy history. What immunizations do I need?  Influenza (flu) vaccine  This is recommended every year. Tetanus, diphtheria, and pertussis (Tdap) vaccine  You may need a Td booster every 10 years. Varicella (chickenpox) vaccine  You may need this if you have not been vaccinated. Human papillomavirus (HPV) vaccine  If recommended by your health care provider, you may need three doses over 6 months. Measles, mumps, and rubella (MMR) vaccine  You may need at least one dose of MMR. You may also need a second dose. Meningococcal conjugate (MenACWY) vaccine  One dose is recommended if you are age 73-21 years and a first-year college student living in a residence hall, or if you have one of several medical conditions. You may also need additional booster doses. Pneumococcal conjugate (PCV13) vaccine  You may need this if you have certain conditions and were not previously vaccinated. Pneumococcal polysaccharide (PPSV23) vaccine  You may need one or two doses if you smoke cigarettes or if you have certain conditions. Hepatitis A vaccine  You may need this if you have certain conditions or if you travel or work in places where you may be exposed to hepatitis A. Hepatitis B vaccine  You may need this if you have certain conditions or if you travel or work in places where you may be exposed to hepatitis B. Haemophilus influenzae type b (Hib) vaccine  You may need this if you have certain conditions. You may receive vaccines as individual doses or as more than one vaccine together in one shot (combination vaccines). Talk with your health care provider about the risks and benefits of combination vaccines. What tests do I need?  Blood tests  Lipid and cholesterol levels. These may be checked every 5 years starting at age  97.  Hepatitis C test.  Hepatitis B test. Screening  Diabetes screening. This is done by checking your blood sugar (glucose) after you have not eaten for a while (fasting).  Sexually transmitted disease (STD) testing.  BRCA-related cancer screening. This may be done if you have a family history of breast, ovarian, tubal, or peritoneal cancers.  Pelvic exam and Pap test. This may be done every 3 years starting at age 66. Starting at age 75, this may be done every 5 years if you have a Pap test in combination with an HPV test. Talk with your health care provider about your test results, treatment options, and if necessary, the need for more tests. Follow these instructions at home: Eating and drinking   Eat a diet that includes fresh fruits and vegetables, whole grains, lean protein, and low-fat dairy.  Take vitamin and mineral supplements as recommended by your health care provider.  Do not drink alcohol if: ? Your health care provider tells you not to drink. ? You are pregnant, may be pregnant, or are planning to become pregnant.  If you drink alcohol: ? Limit how much you have to 0-1 drink a day. ? Be aware of how much alcohol is in your drink. In the [place] one drink equals one 12 oz bottle of beer (355 mL), one 5 oz glass of wine (148 mL), or one 1 oz glass of hard liquor (44 mL). Lifestyle  Take daily care of your teeth and gums.  Stay active. Exercise for at least 30 minutes on 5 or more days each week.  Do not use any products that contain nicotine or tobacco, such as cigarettes, e-cigarettes, and chewing tobacco. If you need help quitting, ask your health care provider.  If you are sexually active, practice safe sex. Use a condom or other form of birth control (contraception) in order to prevent pregnancy and STIs (sexually transmitted infections). If you plan to become pregnant, see your health care provider for a preconception visit. What's next?  Visit your health  care provider once a year for a well check visit.  Ask your health care provider how often you should have your eyes and teeth checked.  Stay up to date on all vaccines. This information is not intended to replace advice given to you by your health care provider. Make sure you discuss any questions you have with your health care provider. Document Revised: 12/10/2017 Document Reviewed: 12/10/2017 Elsevier Patient Education  2020 [employer].

## 2020-01-13 NOTE — Progress Notes (Signed)
10/1/202110:19 AM  Laura [name] 1985/11/18, 34 y.o., female [id number]  Chief Complaint  Patient presents with  . Annual Exam    CPE- should she get the coivd booster?    HPI:   Patient is a 34 y.o. female with past medical history significant for RA, iron def anemia, prediabetes, PCOS, depression, GAD, vitamin D deficiency who presents today for CPE   Pap: [facility] STD: per obgyn BC : [facility], happy with method, managed by obgyn Menses: per [facility], not as heavy or painful Mammogram: at age 49 [place] breast/ovarian cancer: none Had colonoscopy in 2017, serrate polyp right colon, repeat in 3 years, Dr Duanne Guess Exercise/diet: very active at work, back in school, avg diet Takes vitamin D 1000 units a day Obgyn diagnosed her with PCOS Rheumatism sign better since being on plaquenil, has very minor infrequent flares Depression and anxiety very well controlled on lexapro, has not needed xanax in months Going to surgical tech school - needs form completed  Patient Care Team: Rutherford Guys, MD as PCP - General (Family Medicine) Rutherford Guys, MD as Consulting Physician (Family Medicine) Gavin Pound, MD as Consulting Physician (Rheumatology) Linda Hedges, DO as Consulting Physician (Obstetrics and Gynecology) Havery Moros, [name], MD as Consulting Physician (Gastroenterology)  Most Recent Immunizations  Administered Date(s) Administered  . Influenza,inj,Quad PF,6+ Mos 03/19/2017  . Influenza-Unspecified 01/06/2020  . PFIZER SARS-COV-2 Vaccination 06/20/2019  . PPD Test 01/11/2020  . Td 08/13/2017  . Tdap 10/02/2018    Health Maintenance  Topic Date Due  . COLONOSCOPY  06/26/2018  . PAP SMEAR-Modifier  04/15/2019  . TETANUS/TDAP  10/01/2028  . INFLUENZA VACCINE  Completed  . COVID-19 Vaccine  Completed  . Hepatitis C Screening  Completed  . HIV Screening  Completed    Depression screen [facility] 2/9 01/13/2020 12/16/2018 11/18/2018  Decreased Interest 0  0 1  Down, Depressed, Hopeless 0 0 0  PHQ - 2 Score 0 0 1  Altered sleeping - - 1  Tired, decreased energy - - 3  Change in appetite - - 0  Feeling bad or failure about yourself  - - 0  Trouble concentrating - - 0  Moving slowly or fidgety/restless - - 0  Suicidal thoughts - - 0  PHQ-9 Score - - 5  Difficult doing work/chores - - Somewhat difficult    Fall Risk  01/13/2020 12/16/2018 11/18/2018 10/27/2017 10/06/2017  Falls in the past year? 0 0 0 No No  Number falls in past yr: 0 0 0 - -  Injury with Fall? 0 0 0 - -  Follow up Falls evaluation completed - - - -     Allergies  Allergen Reactions  . Kenalog [Triamcinolone Acetonide]     Rash noted after ankle injection by rheumatologist  . Amoxicillin Rash    Prior to Admission medications   Medication Sig Start Date End Date Taking? Authorizing Provider  cholecalciferol (VITAMIN D3) 25 MCG (1000 UNIT) tablet Take 1,000 Units by mouth daily.   Yes [provider]  escitalopram (LEXAPRO) 20 MG tablet TAKE 1 TABLET(20 MG) BY MOUTH EVERY MORNING 01/09/20  Yes Rutherford Guys, MD  hydroxychloroquine (PLAQUENIL) 200 MG tablet Take 200 mg by mouth 2 (two) times daily. 10/14/19  Yes [provider]  LORazepam (ATIVAN) 0.5 MG tablet Take 1 tablet (0.5 mg total) by mouth every 8 (eight) hours as needed for anxiety. 07/08/18  Yes Rutherford Guys, MD  norgestimate-ethinyl estradiol ([place]) 0.25-35 MG-MCG tablet  Take 1 tablet by mouth daily.   Yes [provider]  Vitamin D, Ergocalciferol, (DRISDOL) 1.25 MG (50000 UT) CAPS capsule Take 1 capsule (50,000 Units total) by mouth every 7 (seven) days. Patient not taking: Reported on 01/13/2020 12/06/18   Rutherford Guys, MD    Past Medical History:  Diagnosis Date  . Anxiety   . Arthritis   . Depression   . Frequent headaches   . History of chicken pox   . Otitis media    had tubes as child, then according to pt adipose tissue used to repair ear drum  . Rheumatoid  arthritis ([place])   . UTI (lower urinary tract infection)     Past Surgical History:  Procedure Laterality Date  . APPENDECTOMY  2012  . HYMENECTOMY  2000  . TONSILECTOMY, ADENOIDECTOMY, BILATERAL MYRINGOTOMY AND TUBES      Social History   Tobacco Use  . Smoking status: Never Smoker  . Smokeless tobacco: Never Used  Substance Use Topics  . Alcohol use: Yes    Alcohol/week: 0.0 standard drinks    Comment: occasionally     Family History  Problem Relation Age of Onset  . Arthritis Paternal Grandmother   . Cancer Maternal Grandmother   . Cancer Maternal Grandfather   . Liver cancer Paternal Grandfather     Review of Systems  Constitutional: Negative for chills and fever.  HENT: Negative for hearing loss, sore throat and tinnitus.   Eyes: Negative for blurred vision and double vision.  Respiratory: Negative for cough, shortness of breath and wheezing.   Cardiovascular: Negative for chest pain, palpitations and leg swelling.  Gastrointestinal: Negative for abdominal pain, blood in stool, constipation, diarrhea, melena, nausea and vomiting.  Genitourinary: Negative for dysuria and hematuria.  Neurological: Negative for dizziness, focal weakness and headaches.  Endo/Heme/Allergies: Negative for polydipsia.  Psychiatric/Behavioral: Negative for depression. The patient is not nervous/anxious and does not have insomnia.      OBJECTIVE:  Today's Vitals   01/13/20 1003  BP: 126/86  Pulse: 82  Temp: 97.9 F (36.6 C)  TempSrc: Temporal  SpO2: 98%  Weight: 260 lb 6.4 oz (118.1 kg)  Height: 5\' 6"  (1.676 m)   Body mass index is 42.03 kg/m.   Physical Exam Vitals and nursing note reviewed.  Constitutional:      Appearance: She is well-developed.  HENT:     Head: Normocephalic and atraumatic.     Right Ear: Hearing, tympanic membrane, ear canal and external ear normal.     Left Ear: Hearing, tympanic membrane, ear canal and external ear normal.     Mouth/Throat:      Mouth: Mucous membranes are moist.     Pharynx: No oropharyngeal exudate or posterior oropharyngeal erythema.  Eyes:     Extraocular Movements: Extraocular movements intact.     Conjunctiva/sclera: Conjunctivae normal.     Pupils: Pupils are equal, round, and reactive to light.  Neck:     Thyroid: No thyromegaly.  Cardiovascular:     Rate and Rhythm: Normal rate and regular rhythm.     Heart sounds: Normal heart sounds. No murmur heard.  No friction rub. No gallop.   Pulmonary:     Effort: Pulmonary effort is normal.     Breath sounds: Normal breath sounds. No wheezing, rhonchi or rales.  Abdominal:     General: Bowel sounds are normal. There is no distension.     Palpations: Abdomen is soft. There is no hepatomegaly, splenomegaly or mass.  Tenderness: There is no abdominal tenderness.  Musculoskeletal:        General: Normal range of motion.     Cervical back: Neck supple.     Right lower leg: No edema.     Left lower leg: No edema.  Lymphadenopathy:     Cervical: No cervical adenopathy.  Skin:    General: Skin is warm and dry.  Neurological:     Mental Status: She is alert and oriented to person, place, and time.     Cranial Nerves: No cranial nerve deficit.     Gait: Gait normal.     Deep Tendon Reflexes: Reflexes are normal and symmetric.  Psychiatric:        Mood and Affect: Mood normal.        Behavior: Behavior normal.     No results found for this or any previous visit (from the past 24 hour(s)).  No results found.   ASSESSMENT and PLAN  1. Annual physical exam Routine HCM labs ordered. HCM reviewed/discussed. Anticipatory guidance regarding healthy weight, lifestyle and choices given. Form completed.  2. GAD (generalized anxiety disorder) Controlled. Continue current regime.  - escitalopram (LEXAPRO) 20 MG tablet; Take 1 tablet (20 mg total) by mouth at bedtime.  3. Prediabetes Discussed starting metformin if a1c 6.0 or greater - Hemoglobin A1c  4.  Vitamin D deficiency Checking labs today, medications will be adjusted as needed.  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 H/O iron deficiency anemia - CBC - Iron, TIBC and Ferritin Panel  7. History of colonic polyps - Ambulatory referral to Gastroenterology  Other orders   Return in about 1 year (around 01/12/2021).    Rutherford Guys, MD Primary Care at [place],  [id number] Ph.  [phone] Fax [phone]

## 2020-01-14 LAB — COMPREHENSIVE METABOLIC PANEL
ALT: 7 IU/L (ref 0–32)
AST: 9 IU/L (ref 0–40)
Albumin/Globulin Ratio: 1.4 (ref 1.2–2.2)
Albumin: 4 g/dL (ref 3.8–4.8)
Alkaline Phosphatase: 97 IU/L (ref 44–121)
BUN/Creatinine Ratio: 9 (ref 9–23)
BUN: 8 mg/dL (ref 6–20)
Bilirubin Total: <0.2 mg/dL (ref 0.0–1.2)
CO2: 22 mmol/L (ref 20–29)
Calcium: 9.2 mg/dL (ref 8.7–10.2)
Chloride: 100 mmol/L (ref 96–106)
Creatinine, Ser: 0.92 mg/dL (ref 0.57–1.00)
GFR calc Af Amer: 94 mL/min/{1.73_m2} (ref >59)
GFR calc non Af Amer: 81 mL/min/{1.73_m2} (ref >59)
Globulin, Total: 2.9 g/dL (ref 1.5–4.5)
Glucose: 82 mg/dL (ref 65–99)
Potassium: 4.9 mmol/L (ref 3.5–5.2)
Sodium: 139 mmol/L (ref 134–144)
Total Protein: 6.9 g/dL (ref 6.0–8.5)

## 2020-01-14 LAB — IRON,TIBC AND FERRITIN PANEL
Ferritin: 8 ng/mL — ABNORMAL LOW (ref 15–150)
Iron Saturation: 6 % — CL (ref 15–55)
Iron: 32 ug/dL (ref 27–159)
Total Iron Binding Capacity: 551 ug/dL (ref 250–450)
UIBC: 519 ug/dL — ABNORMAL HIGH (ref 131–425)

## 2020-01-14 LAB — CBC
Hematocrit: 37.8 % (ref 34.0–46.6)
Hemoglobin: 10.9 g/dL — ABNORMAL LOW (ref 11.1–15.9)
MCH: 20.6 pg — ABNORMAL LOW (ref 26.6–33.0)
MCHC: 28.8 g/dL — ABNORMAL LOW (ref 31.5–35.7)
MCV: 72 fL — ABNORMAL LOW (ref 79–97)
Platelets: 405 10*3/uL (ref 150–450)
RBC: 5.29 x10E6/uL — ABNORMAL HIGH (ref 3.77–5.28)
RDW: 15.4 % (ref 11.7–15.4)
WBC: 11.6 10*3/uL — ABNORMAL HIGH (ref 3.4–10.8)

## 2020-01-14 LAB — HEMOGLOBIN A1C
Est. average glucose Bld gHb Est-mCnc: 123 mg/dL
Hgb A1c MFr Bld: 5.9 % — ABNORMAL HIGH (ref 4.8–5.6)

## 2020-01-14 LAB — VITAMIN D 25 HYDROXY (VIT D DEFICIENCY, FRACTURES): Vit D, 25-Hydroxy: 22.4 ng/mL — ABNORMAL LOW (ref 30.0–100.0)

## 2020-01-24 ENCOUNTER — Other Ambulatory Visit: Payer: Self-pay | Admitting: Family Medicine

## 2020-01-24 MED ORDER — FERROUS GLUCONATE 324 (38 FE) MG PO TABS: 324.0000 mg | ORAL_TABLET | Freq: Every day | ORAL | 1 refills | Status: DC

## 2020-02-28 ENCOUNTER — Encounter: Payer: Self-pay | Admitting: Family Medicine

## 2020-02-28 ENCOUNTER — Ambulatory Visit: Payer: Managed Care, Other (non HMO) | Admitting: Family Medicine

## 2020-02-28 ENCOUNTER — Other Ambulatory Visit: Payer: Self-pay

## 2020-02-28 VITALS — BP 142/89 | HR 73 | Temp 98.4°F | Ht 66.0 in | Wt 264.0 lb

## 2020-02-28 DIAGNOSIS — Z6841 Body Mass Index (BMI) 40.0 and over, adult: Secondary | ICD-10-CM

## 2020-02-28 DIAGNOSIS — R718 Other abnormality of red blood cells: Secondary | ICD-10-CM

## 2020-02-28 DIAGNOSIS — F330 Major depressive disorder, recurrent, mild: Secondary | ICD-10-CM

## 2020-02-28 DIAGNOSIS — Z862 Personal history of diseases of the blood and blood-forming organs and certain disorders involving the immune mechanism: Secondary | ICD-10-CM | POA: Diagnosis not present

## 2020-02-28 DIAGNOSIS — M069 Rheumatoid arthritis, unspecified: Secondary | ICD-10-CM

## 2020-02-28 MED ORDER — HYDROXYCHLOROQUINE SULFATE 200 MG PO TABS: 200.0000 mg | ORAL_TABLET | Freq: Two times a day (BID) | ORAL | 0 refills | Status: DC

## 2020-02-28 NOTE — Patient Instructions (Addendum)
Vitamin D replacement Over the Counter 1000-2000 IU daily with food.   Health Maintenance, Female Adopting a healthy lifestyle and getting preventive care are important in promoting health and wellness. Ask your health care provider about:  The right schedule for you to have regular tests and exams.  Things you can do on your own to prevent diseases and keep yourself healthy. What should I know about diet, weight, and exercise? Eat a healthy diet   Eat a diet that includes plenty of vegetables, fruits, low-fat dairy products, and lean protein.  Do not eat a lot of foods that are high in solid fats, added sugars, or sodium. Maintain a healthy weight Body mass index (BMI) is used to identify weight problems. It estimates body fat based on height and weight. Your health care provider can help determine your BMI and help you achieve or maintain a healthy weight. Get regular exercise Get regular exercise. This is one of the most important things you can do for your health. Most adults should:  Exercise for at least 150 minutes each week. The exercise should increase your heart rate and make you sweat (moderate-intensity exercise).  Do strengthening exercises at least twice a week. This is in addition to the moderate-intensity exercise.  Spend less time sitting. Even light physical activity can be beneficial. Watch cholesterol and blood lipids Have your blood tested for lipids and cholesterol at 34 years of age, then have this test every 5 years. Have your cholesterol levels checked more often if:  Your lipid or cholesterol levels are high.  You are older than 34 years of age.  You are at high risk for heart disease. What should I know about cancer screening? Depending on your health history and family history, you may need to have cancer screening at various ages. This may include screening for:  Breast cancer.  Cervical cancer.  Colorectal cancer.  Skin cancer.  Lung  cancer. What should I know about heart disease, diabetes, and high blood pressure? Blood pressure and heart disease  High blood pressure causes heart disease and increases the risk of stroke. This is more likely to develop in people who have high blood pressure readings, are of African descent, or are overweight.  Have your blood pressure checked: ? Every 3-5 years if you are 18-89 years of age. ? Every year if you are 23 years old or older. Diabetes Have regular diabetes screenings. This checks your fasting blood sugar level. Have the screening done:  Once every three years after age 51 if you are at a normal weight and have a low risk for diabetes.  More often and at a younger age if you are overweight or have a high risk for diabetes. What should I know about preventing infection? Hepatitis B If you have a higher risk for hepatitis B, you should be screened for this virus. Talk with your health care provider to find out if you are at risk for hepatitis B infection. Hepatitis C Testing is recommended for:  Everyone born from [birth year] through 1965.  Anyone with known risk factors for hepatitis C. Sexually transmitted infections (STIs)  Get screened for STIs, including gonorrhea and chlamydia, if: ? You are sexually active and are younger than 34 years of age. ? You are older than 34 years of age and your health care provider tells you that you are at risk for this type of infection. ? Your sexual activity has changed since you were last screened, and you are  at increased risk for chlamydia or gonorrhea. Ask your health care provider if you are at risk.  Ask your health care provider about whether you are at high risk for HIV. Your health care provider may recommend a prescription medicine to help prevent HIV infection. If you choose to take medicine to prevent HIV, you should first get tested for HIV. You should then be tested every 3 months for as long as you are taking the  medicine. Pregnancy  If you are about to stop having your period (premenopausal) and you may become pregnant, seek counseling before you get pregnant.  Take 400 to 800 micrograms (mcg) of folic acid every day if you become pregnant.  Ask for birth control (contraception) if you want to prevent pregnancy. Osteoporosis and menopause Osteoporosis is a disease in which the bones lose minerals and strength with aging. This can result in bone fractures. If you are 12 years old or older, or if you are at risk for osteoporosis and fractures, ask your health care provider if you should:  Be screened for bone loss.  Take a calcium or vitamin D supplement to lower your risk of fractures.  Be given hormone replacement therapy (HRT) to treat symptoms of menopause. Follow these instructions at home: Lifestyle  Do not use any products that contain nicotine or tobacco, such as cigarettes, e-cigarettes, and chewing tobacco. If you need help quitting, ask your health care provider.  Do not use street drugs.  Do not share needles.  Ask your health care provider for help if you need support or information about quitting drugs. Alcohol use  Do not drink alcohol if: ? Your health care provider tells you not to drink. ? You are pregnant, may be pregnant, or are planning to become pregnant.  If you drink alcohol: ? Limit how much you use to 0-1 drink a day. ? Limit intake if you are breastfeeding.  Be aware of how much alcohol is in your drink. In the [place] one drink equals one 12 oz bottle of beer (355 mL), one 5 oz glass of wine (148 mL), or one 1 oz glass of hard liquor (44 mL). General instructions  Schedule regular health, dental, and eye exams.  Stay current with your vaccines.  Tell your health care provider if: ? You often feel depressed. ? You have ever been abused or do not feel safe at home. Summary  Adopting a healthy lifestyle and getting preventive care are important in  promoting health and wellness.  Follow your health care provider's instructions about healthy diet, exercising, and getting tested or screened for diseases.  Follow your health care provider's instructions on monitoring your cholesterol and blood pressure. This information is not intended to replace advice given to you by your health care provider. Make sure you discuss any questions you have with your health care provider. Document Revised: 03/24/2018 Document Reviewed: 03/24/2018 Elsevier Patient Education  [employer].  If you have lab work done today you will be contacted with your lab results within the next 2 weeks.  If you have not heard from [place] then please contact us. The fastest way to get your results is to register for My Chart.   IF you received an x-ray today, you will receive an invoice from [facility]. Please contact [facility] at [phone] with questions or concerns regarding your invoice.   IF you received labwork today, you will receive an invoice from [place]. Please contact [facility] at [phone] with questions  or concerns regarding your invoice.   Our billing staff will not be able to assist you with questions regarding bills from these companies.  You will be contacted with the lab results as soon as they are available. The fastest way to get your results is to activate your My Chart account. Instructions are located on the last page of this paperwork. If you have not heard from [place] regarding the results in 2 weeks, please contact this office.

## 2020-02-28 NOTE — Progress Notes (Signed)
11/16/20213:41 PM  Laura [name] 1985/08/27, 34 y.o., female [id number]  Chief Complaint  Patient presents with  . transfer of care    HPI:   Patient is a 34 y.o. female with past medical history significant for RA, iron def anemia, prediabetes, PCOS, depression, GAD, vitamin D deficiency who presents today for transfer of care.  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  Patient Care Team: Kreston Ahrendt, Laurita Quint, FNP as PCP - General (Family Medicine) Gavin Pound, MD as Consulting Physician (Rheumatology) Linda Hedges, DO as Consulting Physician (Obstetrics and Gynecology) Havery Moros, [name], MD as Consulting Physician (Gastroenterology)  Pap: [facility] STD: per obgyn BC : [facility], happy with method, managed by obgyn Menses: per [facility], not as heavy or painful Mammogram: at age 89 [place] breast/ovarian cancer: none Had colonoscopy in 2017, serrate polyp right colon, repeat in 3 years, Dr Duanne Guess: referral to Gastroenterology placed 10/1: needs to schedule Exercise/diet: very active at work, back in school, avg diet Takes vitamin D 1000 units a day Obgyn diagnosed her with PCOS COVID: booster done today  PCOS: ultrasound polycystic ovaries and irregular periods.  GAD (generalized anxiety disorder) Controlled. Continue current regime.  - escitalopram (LEXAPRO) 20 MG tablet; Take 1 tablet (20 mg total) by mouth at bedtime.Depression and anxiety very well controlled on lexapro, has not needed xanax in months   Prediabetes Discussed starting metformin if a1c 6.0 or greater Lab Results  Component Value Date   HGBA1C 5.9 (H) 01/13/2020    Vitamin D deficiency Checking labs today, medications will be adjusted as needed.  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 sign better since being on plaquenil has very minor infrequent flares  H/O iron deficiency  anemia Lab Results  Component Value Date   WBC 11.6 (H) 01/13/2020   HGB 10.9 (L) 01/13/2020   HCT 37.8 01/13/2020   MCV 72 (L) 01/13/2020   PLT 405 01/13/2020   Lab Results  Component Value Date   IRON 32 01/13/2020   TIBC 551 (HH) 01/13/2020   FERRITIN 8 (L) 01/13/2020    Depression screen PHQ 2/9 02/28/2020 01/13/2020 12/16/2018  Decreased Interest 0 0 0  Down, Depressed, Hopeless 0 0 0  PHQ - 2 Score 0 0 0  Altered sleeping - - -  Tired, decreased energy - - -  Change in appetite - - -  Feeling bad or failure about yourself  - - -  Trouble concentrating - - -  Moving slowly or fidgety/restless - - -  Suicidal thoughts - - -  PHQ-9 Score - - -  Difficult doing work/chores - - -    Fall Risk  02/28/2020 01/13/2020 12/16/2018 11/18/2018 10/27/2017  Falls in the past year? 0 0 0 0 No  Number falls in past yr: 0 0 0 0 -  Injury with Fall? 0 0 0 0 -  Follow up Falls evaluation completed Falls evaluation completed - - -     Allergies  Allergen Reactions  . Kenalog [Triamcinolone Acetonide]     Rash noted after ankle injection by rheumatologist  . Amoxicillin Rash    Prior to Admission medications   Medication Sig Start Date End Date Taking? Authorizing Provider  cholecalciferol (VITAMIN D3) 25 MCG (1000 UNIT) tablet Take 1,000 Units by mouth daily.    [provider]  escitalopram (LEXAPRO) 20 MG tablet Take 1 tablet (20 mg  total) by mouth at bedtime. 01/13/20   Rutherford Guys, MD  ferrous gluconate (FERGON) 324 MG tablet Take 1 tablet (324 mg total) by mouth daily with breakfast. 01/24/20   Rutherford Guys, MD  hydroxychloroquine (PLAQUENIL) 200 MG tablet Take 200 mg by mouth 2 (two) times daily. 10/14/19   [provider]  LORazepam (ATIVAN) 0.5 MG tablet Take 1 tablet (0.5 mg total) by mouth every 8 (eight) hours as needed for anxiety. 07/08/18   Rutherford Guys, MD  norgestimate-ethinyl estradiol (MILI) 0.25-35 MG-MCG tablet Take 1 tablet by mouth daily.     [provider]    Past Medical History:  Diagnosis Date  . Anxiety   . Arthritis   . Depression   . Depression    Phreesia 02/25/2020  . Frequent headaches   . History of chicken pox   . Otitis media    had tubes as child, then according to pt adipose tissue used to repair ear drum  . Rheumatoid arthritis ([place])   . UTI (lower urinary tract infection)     Past Surgical History:  Procedure Laterality Date  . APPENDECTOMY  2012  . HYMENECTOMY  2000  . TONSILECTOMY, ADENOIDECTOMY, BILATERAL MYRINGOTOMY AND TUBES      Social History   Tobacco Use  . Smoking status: Never Smoker  . Smokeless tobacco: Never Used  Substance Use Topics  . Alcohol use: Yes    Alcohol/week: 0.0 standard drinks    Comment: occasionally     Family History  Problem Relation Age of Onset  . Arthritis Paternal Grandmother   . Cancer Maternal Grandmother   . Cancer Maternal Grandfather   . Liver cancer Paternal Grandfather     Review of Systems  Constitutional: Negative for chills, fever and malaise/fatigue.  Eyes: Negative for blurred vision and double vision.  Respiratory: Negative for cough, shortness of breath and wheezing.   Cardiovascular: Negative for chest pain, palpitations and leg swelling.  Gastrointestinal: Negative for abdominal pain, blood in stool, constipation, diarrhea, heartburn, nausea and vomiting.  Genitourinary: Negative for dysuria, frequency and hematuria.  Musculoskeletal: Negative for back pain and joint pain.  Skin: Negative for rash.  Neurological: Negative for dizziness, weakness and headaches.     OBJECTIVE:  Today's Vitals   02/28/20 1502  BP: (!) 142/89  Pulse: 73  Temp: 98.4 F (36.9 C)  SpO2: 98%  Weight: 264 lb (119.7 kg)  Height: 5\' 6"  (1.676 m)   Body mass index is 42.61 kg/m.   Physical Exam Constitutional:      General: She is not in acute distress.    Appearance: Normal appearance. She is not ill-appearing.  HENT:      Head: Normocephalic.  Cardiovascular:     Rate and Rhythm: Normal rate and regular rhythm.     Pulses: Normal pulses.     Heart sounds: Normal heart sounds. No murmur heard.  No friction rub. No gallop.   Pulmonary:     Effort: Pulmonary effort is normal. No respiratory distress.     Breath sounds: Normal breath sounds. No stridor. No wheezing, rhonchi or rales.  Abdominal:     General: Bowel sounds are normal.     Palpations: Abdomen is soft.     Tenderness: There is no abdominal tenderness.  Musculoskeletal:     Right lower leg: No edema.     Left lower leg: No edema.  Skin:    General: Skin is warm and dry.  Neurological:  Mental Status: She is alert and oriented to person, place, and time.  Psychiatric:        Mood and Affect: Mood normal.        Behavior: Behavior normal.     No results found for this or any previous visit (from the past 24 hour(s)).  No results found.   ASSESSMENT and PLAN  Problem List Items Addressed This Visit      Musculoskeletal and Integument   Rheumatoid arthritis ([place])   Relevant Medications   hydroxychloroquine (PLAQUENIL) 200 MG tablet     Other   BMI 40.0-44.9, adult (HCC)   Mild episode of recurrent major depressive disorder ([place])    Other Visit Diagnoses    H/O iron deficiency anemia    -  Primary   Relevant Orders   Ambulatory referral to Hematology   Microcytosis       Relevant Orders   Ambulatory referral to Hematology    Discussed PCOS and elevated A1C Will be having an appointment this week with reproductive endocrinology to discuss egg retrieval.   Return in about 6 months (around 08/27/2020).    Huston Foley Violett Hobbs, FNP-BC Primary Care at [facility] [address], [place] [address] Ph.  [phone] Fax [phone]

## 2020-02-29 ENCOUNTER — Telehealth: Payer: Self-pay | Admitting: Hematology and Oncology

## 2020-02-29 NOTE — Telephone Encounter (Signed)
Received a new hem referral from [facility] for microcytosis/ h/o anemia. Laura [name] returned my call and has been scheduled to see Dr. Chryl Heck on 11/18 at 2pm. Pt aware to arrive 30 minutes early.

## 2020-03-01 ENCOUNTER — Inpatient Hospital Stay: Payer: Managed Care, Other (non HMO)

## 2020-03-01 ENCOUNTER — Inpatient Hospital Stay: Payer: Managed Care, Other (non HMO) | Attending: Hematology and Oncology | Admitting: Hematology and Oncology

## 2020-03-01 ENCOUNTER — Telehealth: Payer: Self-pay | Admitting: Hematology and Oncology

## 2020-03-01 ENCOUNTER — Other Ambulatory Visit: Payer: Self-pay

## 2020-03-01 VITALS — BP 144/90 | HR 78 | Temp 97.7°F | Resp 18 | Ht 66.0 in | Wt 262.5 lb

## 2020-03-01 DIAGNOSIS — F419 Anxiety disorder, unspecified: Secondary | ICD-10-CM | POA: Insufficient documentation

## 2020-03-01 DIAGNOSIS — Z8261 Family history of arthritis: Secondary | ICD-10-CM | POA: Diagnosis not present

## 2020-03-01 DIAGNOSIS — Z79899 Other long term (current) drug therapy: Secondary | ICD-10-CM | POA: Diagnosis not present

## 2020-03-01 DIAGNOSIS — D509 Iron deficiency anemia, unspecified: Secondary | ICD-10-CM | POA: Diagnosis not present

## 2020-03-01 DIAGNOSIS — Z87440 Personal history of urinary (tract) infections: Secondary | ICD-10-CM | POA: Insufficient documentation

## 2020-03-01 DIAGNOSIS — R5383 Other fatigue: Secondary | ICD-10-CM | POA: Insufficient documentation

## 2020-03-01 DIAGNOSIS — E669 Obesity, unspecified: Secondary | ICD-10-CM | POA: Diagnosis not present

## 2020-03-01 DIAGNOSIS — Z800 Family history of malignant neoplasm of digestive organs: Secondary | ICD-10-CM

## 2020-03-01 DIAGNOSIS — F32A Depression, unspecified: Secondary | ICD-10-CM

## 2020-03-01 DIAGNOSIS — Z880 Allergy status to penicillin: Secondary | ICD-10-CM

## 2020-03-01 DIAGNOSIS — R0602 Shortness of breath: Secondary | ICD-10-CM | POA: Insufficient documentation

## 2020-03-01 DIAGNOSIS — M069 Rheumatoid arthritis, unspecified: Secondary | ICD-10-CM | POA: Insufficient documentation

## 2020-03-01 DIAGNOSIS — Z9049 Acquired absence of other specified parts of digestive tract: Secondary | ICD-10-CM | POA: Diagnosis not present

## 2020-03-01 NOTE — Telephone Encounter (Signed)
Scheduled per los. Declined printout  

## 2020-03-01 NOTE — Progress Notes (Signed)
Laura [name]  Patient Care Team: Just, Laurita Quint, FNP as PCP - General (Family Medicine) Laura Pound, MD as Consulting Physician (Rheumatology) Laura Hedges, DO as Consulting Physician (Obstetrics and Gynecology) Laura [name], Laura Raspberry, MD as Consulting Physician (Gastroenterology)  CHIEF COMPLAINTS/PURPOSE OF CONSULTATION:  IDA  ASSESSMENT & PLAN:  No problem-specific Assessment & Plan notes found for this encounter.  No orders of the defined types were placed in this encounter.  This is a very pleasant 34 year old female patient with past medical history significant for rheumatoid arthritis on hydroxychloroquine now referred hematology for evaluation and recommendations regarding her severe iron deficiency. ROS pertinent for fatigue, hair loss, some shortness of breath with exertion, feels like she has to take a nap every day. Physical examination, obese female, no palpable lymphadenopathy or hepatosplenomegaly, koilonychia. I reviewed her labs from October which showed hemoglobin of 10.9, MCV of 72, mildly elevated leukocyte, iron panel with total iron binding capacity of 550 and ferritin of 8. At this time she does not have any ongoing heavy menstrual cycles which were contributed to iron deficiency but she did have a history of heavy menstrual cycles prior to taking birth control about a year or so ago.  She may also have a component of malabsorption since she consumes a good amount of red meat and is unable to catch up with her iron deficiency. At this time I am not entirely sure if this is a result of heavy menstrual cycles in the past versus malabsorption.  Clearly explained common causes of iron deficiency anemia include blood loss, malabsorption and hemolysis.  I do not believe she has any hemolysis at this time. I have recommended trying oral iron supplementation for about 6 weeks, repeat labs if she does not have any evidence of improvement then I would  proceed with intravenous iron.  We have discussed about common intravenous iron, I explained that we may choose Venofer or Injectafer for intravenous iron supplementation.  I discussed small risk of allergy and anaphylaxis and hypophosphatemia which was reported in some patients.  Most common symptoms are flulike symptoms and headaches after iron infusion.  She is agreeable to this plan, she will increase her iron supplementation to twice a day to clinic for lab appointment in 6weeks return to clinic with 3 months.  I do not believe she has thalassemia, she has had normal MCV in the past which is unlikely with thalassemia. Thank you for consulting [place] in the care of this patient please not hesitate to contact us with any questions or concerns.  HISTORY OF PRESENTING ILLNESS:  Laura [name] 34 y.o. female is here because of IDA.  This is a very pleasant 34 year old female patient who arrived to the appointment by herself for evaluation and recommendations for an iron deficiency anemia.  [name] tells me that she has been feeling tired all the time, feels like she has, has noticed hair loss deficiency.  She did take iron pills about a year ago for 3 months.  She is now back on ferrous gluconate, started just 2 days ago.  She denies any hematochezia or melena.  Did not have in the past 1 to 2 years since she started birth control.  She does feel that basis family history of iron deficiency deficiencies.  Rest of the review of systems as mentioned below and unremarkable.  REVIEW OF SYSTEMS:   Constitutional: Denies fevers, chills or abnormal night sweats Eyes: Denies blurriness of vision, double vision or  watery eyes Ears, nose, mouth, throat, and face: Denies mucositis or sore throat Respiratory: Denies cough, dyspnea or wheezes Cardiovascular: Denies palpitation, chest discomfort or lower extremity swelling Gastrointestinal:  Denies nausea, heartburn or change in bowel habits Skin: Denies  abnormal skin rashes Lymphatics: Denies new lymphadenopathy or easy bruising Neurological:Denies numbness, tingling or new weaknesses Behavioral/Psych: Mood is stable, no new changes  All other systems were reviewed with the patient and are negative.  MEDICAL HISTORY:  Past Medical History:  Diagnosis Date  . Anxiety   . Arthritis   . Depression   . Depression    Phreesia 02/25/2020  . Frequent headaches   . History of chicken pox   . Otitis media    had tubes as child, then according to pt adipose tissue used to repair ear drum  . Rheumatoid arthritis ([place])   . UTI (lower urinary tract infection)     SURGICAL HISTORY: Past Surgical History:  Procedure Laterality Date  . APPENDECTOMY  2012  . HYMENECTOMY  2000  . TONSILECTOMY, ADENOIDECTOMY, BILATERAL MYRINGOTOMY AND TUBES      SOCIAL HISTORY: Social History   Socioeconomic History  . Marital status: Single    Spouse name: Not on file  . Number of children: 0  . Years of education: Not on file  . Highest education level: Not on file  Occupational History  . Occupation: Teacher, music: [employer]  Tobacco Use  . Smoking status: Never Smoker  . Smokeless tobacco: Never Used  Substance and Sexual Activity  . Alcohol use: Yes    Alcohol/week: 0.0 standard drinks    Comment: occasionally   . Drug use: No  . Sexual activity: Not Currently    Birth control/protection: Pill  Other Topics Concern  . Not on file  Social History Narrative   Work or School: Quarry manager for [employer] Situation: lives with parents      Spiritual Beliefs: Christian       Lifestyle: no regular exercise; diet is terrible      Social Determinants of Radio broadcast assistant Strain:   . Difficulty of Paying Living Expenses: Not on file  Food Insecurity:   . Worried About Charity fundraiser in the Last Year: Not on file  . Ran Out of Food in the Last Year: Not on file  Transportation Needs:   . Lack of  Transportation (Medical): Not on file  . Lack of Transportation (Non-Medical): Not on file  Physical Activity:   . Days of Exercise per Week: Not on file  . Minutes of Exercise per Session: Not on file  Stress:   . Feeling of Stress : Not on file  Social Connections:   . Frequency of Communication with Friends and Family: Not on file  . Frequency of Social Gatherings with Friends and Family: Not on file  . Attends Religious Services: Not on file  . Active Member of Clubs or Organizations: Not on file  . Attends Archivist Meetings: Not on file  . Marital Status: Not on file  Intimate Partner Violence:   . Fear of Current or Ex-Partner: Not on file  . Emotionally Abused: Not on file  . Physically Abused: Not on file  . Sexually Abused: Not on file    FAMILY HISTORY: Family History  Problem Relation Age of Onset  . Arthritis Paternal Grandmother   . Cancer Maternal Grandmother   . Cancer  Maternal Grandfather   . Liver cancer Paternal Grandfather     ALLERGIES:  is allergic to kenalog [triamcinolone acetonide] and amoxicillin.  MEDICATIONS:  Current Outpatient Medications  Medication Sig Dispense Refill  . cholecalciferol (VITAMIN D3) 25 MCG (1000 UNIT) tablet Take 1,000 Units by mouth daily.    Marland Kitchen escitalopram (LEXAPRO) 20 MG tablet Take 1 tablet (20 mg total) by mouth at bedtime. 90 tablet 3  . ferrous gluconate (FERGON) 324 MG tablet Take 1 tablet (324 mg total) by mouth daily with breakfast. 90 tablet 1  . hydroxychloroquine (PLAQUENIL) 200 MG tablet Take 1 tablet (200 mg total) by mouth 2 (two) times daily. 60 tablet 0  . LORazepam (ATIVAN) 0.5 MG tablet Take 1 tablet (0.5 mg total) by mouth every 8 (eight) hours as needed for anxiety. 10 tablet 0  . norgestimate-ethinyl estradiol (MILI) 0.25-35 MG-MCG tablet Take 1 tablet by mouth daily.     No current facility-administered medications for this visit.     PHYSICAL EXAMINATION: ECOG PERFORMANCE STATUS: 1 -  Symptomatic but completely ambulatory  Vitals:   03/01/20 1350  BP: (!) 144/90  Pulse: 78  Resp: 18  Temp: 97.7 F (36.5 C)  SpO2: 98%   Filed Weights   03/01/20 1350  Weight: 262 lb 8 oz (119.1 kg)    GENERAL:alert, no distress and comfortable, obese. SKIN: skin color, texture, turgor are normal, no rashes or significant lesions EYES: normal, mild pallor, sclera clear OROPHARYNX:no exudate, no erythema and lips, buccal mucosa, and tongue normal  NECK: supple, thyroid normal size, non-tender, without nodularity LYMPH:  no palpable lymphadenopathy in the cervical, axillary or inguinal LUNGS: clear to auscultation and percussion with normal breathing effort HEART: regular rate & rhythm and no murmurs and no lower extremity edema ABDOMEN:abdomen soft, non-tender and normal bowel sounds Musculoskeletal:no cyanosis of digits and no clubbing  PSYCH: alert & oriented x 3 with fluent speech NEURO: no focal motor/sensory deficits  LABORATORY DATA:  I have reviewed the data as listed Lab Results  Component Value Date   WBC 11.6 (H) 01/13/2020   HGB 10.9 (L) 01/13/2020   HCT 37.8 01/13/2020   MCV 72 (L) 01/13/2020   PLT 405 01/13/2020     Chemistry      Component Value Date/Time   NA 139 01/13/2020 1144   K 4.9 01/13/2020 1144   CL 100 01/13/2020 1144   CO2 22 01/13/2020 1144   BUN 8 01/13/2020 1144   CREATININE 0.92 01/13/2020 1144   CREATININE 0.80 04/29/2012 1724      Component Value Date/Time   CALCIUM 9.2 01/13/2020 1144   ALKPHOS 97 01/13/2020 1144   AST 9 01/13/2020 1144   ALT 7 01/13/2020 1144   BILITOT <0.2 01/13/2020 1144     I have reviewed all labs. Mild anemia with severe Iron deficiency. No concerns for thalassemia.  RADIOGRAPHIC STUDIES: I have personally reviewed the radiological images as listed and agreed with the findings in the report. No results found.  All questions were answered. The patient knows to call the clinic with any problems,  questions or concerns. I spent a total of 45 minutes in the care of this patient including history and physical, review of records, counseling and coordination of care.Benay Pike, MD 03/01/2020 3:01 PM

## 2020-03-12 ENCOUNTER — Telehealth: Payer: Self-pay | Admitting: Family Medicine

## 2020-03-12 NOTE — Telephone Encounter (Signed)
Patient called to request written Rx for lab test (needs tb test for school). She works at [employer] and blood teses are free; just needs written script. Will pick up when ready. Please advise at [phone]

## 2020-03-12 NOTE — Telephone Encounter (Signed)
Called patient and she stated she was able to order the TB test directly through her job and will no longer be needing an order.

## 2020-04-12 ENCOUNTER — Inpatient Hospital Stay: Payer: Managed Care, Other (non HMO) | Attending: Hematology and Oncology

## 2020-04-12 ENCOUNTER — Other Ambulatory Visit: Payer: Self-pay

## 2020-04-12 DIAGNOSIS — D509 Iron deficiency anemia, unspecified: Secondary | ICD-10-CM | POA: Insufficient documentation

## 2020-04-12 LAB — CBC WITH DIFFERENTIAL/PLATELET
Abs Immature Granulocytes: 0.1 10*3/uL — ABNORMAL HIGH (ref 0.00–0.07)
Basophils Absolute: 0.1 10*3/uL (ref 0.0–0.1)
Basophils Relative: 1 %
Eosinophils Absolute: 0.2 10*3/uL (ref 0.0–0.5)
Eosinophils Relative: 2 %
HCT: 39.4 % (ref 36.0–46.0)
Hemoglobin: 11.7 g/dL — ABNORMAL LOW (ref 12.0–15.0)
Immature Granulocytes: 1 %
Lymphocytes Relative: 14 %
Lymphs Abs: 1.8 10*3/uL (ref 0.7–4.0)
MCH: 21.4 pg — ABNORMAL LOW (ref 26.0–34.0)
MCHC: 29.7 g/dL — ABNORMAL LOW (ref 30.0–36.0)
MCV: 72 fL — ABNORMAL LOW (ref 80.0–100.0)
Monocytes Absolute: 0.6 10*3/uL (ref 0.1–1.0)
Monocytes Relative: 5 %
Neutro Abs: 10.3 10*3/uL — ABNORMAL HIGH (ref 1.7–7.7)
Neutrophils Relative %: 77 %
Platelets: 219 10*3/uL (ref 150–400)
RBC: 5.47 MIL/uL — ABNORMAL HIGH (ref 3.87–5.11)
RDW: 16.5 % — ABNORMAL HIGH (ref 11.5–15.5)
WBC: 13.1 10*3/uL — ABNORMAL HIGH (ref 4.0–10.5)
nRBC: 0 % (ref 0.0–0.2)

## 2020-04-12 LAB — VITAMIN B12: Vitamin B-12: 236 pg/mL (ref 180–914)

## 2020-04-16 LAB — IRON AND TIBC
Iron: 26 ug/dL — ABNORMAL LOW (ref 41–142)
Saturation Ratios: 6 % — ABNORMAL LOW (ref 21–57)
TIBC: 430 ug/dL (ref 236–444)
UIBC: 404 ug/dL — ABNORMAL HIGH (ref 120–384)

## 2020-04-16 LAB — FERRITIN: Ferritin: 20 ng/mL (ref 11–307)

## 2020-05-28 ENCOUNTER — Telehealth: Payer: Self-pay | Admitting: Hematology and Oncology

## 2020-05-28 NOTE — Telephone Encounter (Signed)
Rescheduled appts per 2/14 incoming call from pt requesting to reschedule. Pt confirmed new appt date and time.

## 2020-05-30 ENCOUNTER — Other Ambulatory Visit: Payer: Managed Care, Other (non HMO)

## 2020-05-30 ENCOUNTER — Ambulatory Visit: Payer: Managed Care, Other (non HMO) | Admitting: Hematology and Oncology

## 2020-06-13 ENCOUNTER — Inpatient Hospital Stay: Payer: Managed Care, Other (non HMO) | Attending: Hematology and Oncology

## 2020-06-13 ENCOUNTER — Other Ambulatory Visit: Payer: Self-pay

## 2020-06-13 ENCOUNTER — Inpatient Hospital Stay: Payer: Managed Care, Other (non HMO) | Admitting: Hematology and Oncology

## 2020-06-13 VITALS — BP 133/80 | HR 78 | Temp 98.4°F | Resp 18 | Ht 66.0 in | Wt 266.8 lb

## 2020-06-13 DIAGNOSIS — D72829 Elevated white blood cell count, unspecified: Secondary | ICD-10-CM | POA: Diagnosis not present

## 2020-06-13 DIAGNOSIS — R0602 Shortness of breath: Secondary | ICD-10-CM | POA: Insufficient documentation

## 2020-06-13 DIAGNOSIS — Z7952 Long term (current) use of systemic steroids: Secondary | ICD-10-CM | POA: Insufficient documentation

## 2020-06-13 DIAGNOSIS — Z8261 Family history of arthritis: Secondary | ICD-10-CM | POA: Insufficient documentation

## 2020-06-13 DIAGNOSIS — M069 Rheumatoid arthritis, unspecified: Secondary | ICD-10-CM | POA: Diagnosis not present

## 2020-06-13 DIAGNOSIS — Z880 Allergy status to penicillin: Secondary | ICD-10-CM | POA: Insufficient documentation

## 2020-06-13 DIAGNOSIS — Z9049 Acquired absence of other specified parts of digestive tract: Secondary | ICD-10-CM | POA: Insufficient documentation

## 2020-06-13 DIAGNOSIS — Z809 Family history of malignant neoplasm, unspecified: Secondary | ICD-10-CM | POA: Insufficient documentation

## 2020-06-13 DIAGNOSIS — Z87440 Personal history of urinary (tract) infections: Secondary | ICD-10-CM | POA: Diagnosis not present

## 2020-06-13 DIAGNOSIS — R5383 Other fatigue: Secondary | ICD-10-CM | POA: Insufficient documentation

## 2020-06-13 DIAGNOSIS — Z79899 Other long term (current) drug therapy: Secondary | ICD-10-CM | POA: Insufficient documentation

## 2020-06-13 DIAGNOSIS — D509 Iron deficiency anemia, unspecified: Secondary | ICD-10-CM | POA: Diagnosis present

## 2020-06-13 DIAGNOSIS — Z800 Family history of malignant neoplasm of digestive organs: Secondary | ICD-10-CM | POA: Insufficient documentation

## 2020-06-13 LAB — CBC WITH DIFFERENTIAL/PLATELET
Abs Immature Granulocytes: 0.24 10*3/uL — ABNORMAL HIGH (ref 0.00–0.07)
Abs Immature Granulocytes: 0.19 10*3/uL — ABNORMAL HIGH (ref 0.00–0.07)
Basophils Absolute: 0.1 10*3/uL (ref 0.0–0.1)
Basophils Absolute: 0.1 10*3/uL (ref 0.0–0.1)
Basophils Relative: 1 %
Basophils Relative: 0 %
Eosinophils Absolute: 0.2 10*3/uL (ref 0.0–0.5)
Eosinophils Absolute: 0.2 10*3/uL (ref 0.0–0.5)
Eosinophils Relative: 1 %
Eosinophils Relative: 1 %
HCT: 36.6 % (ref 36.0–46.0)
HCT: 37.7 % (ref 36.0–46.0)
Hemoglobin: 10.7 g/dL — ABNORMAL LOW (ref 12.0–15.0)
Hemoglobin: 11.1 g/dL — ABNORMAL LOW (ref 12.0–15.0)
Immature Granulocytes: 1 %
Immature Granulocytes: 1 %
Lymphocytes Relative: 18 %
Lymphocytes Relative: 18 %
Lymphs Abs: 3.3 10*3/uL (ref 0.7–4.0)
Lymphs Abs: 3.2 10*3/uL (ref 0.7–4.0)
MCH: 22.2 pg — ABNORMAL LOW (ref 26.0–34.0)
MCH: 21.9 pg — ABNORMAL LOW (ref 26.0–34.0)
MCHC: 29.2 g/dL — ABNORMAL LOW (ref 30.0–36.0)
MCHC: 29.4 g/dL — ABNORMAL LOW (ref 30.0–36.0)
MCV: 75.9 fL — ABNORMAL LOW (ref 80.0–100.0)
MCV: 74.5 fL — ABNORMAL LOW (ref 80.0–100.0)
Monocytes Absolute: 1.2 10*3/uL — ABNORMAL HIGH (ref 0.1–1.0)
Monocytes Absolute: 1.3 10*3/uL — ABNORMAL HIGH (ref 0.1–1.0)
Monocytes Relative: 6 %
Monocytes Relative: 7 %
Neutro Abs: 13.2 10*3/uL — ABNORMAL HIGH (ref 1.7–7.7)
Neutro Abs: 12.9 10*3/uL — ABNORMAL HIGH (ref 1.7–7.7)
Neutrophils Relative %: 73 %
Neutrophils Relative %: 73 %
Platelets: 361 10*3/uL (ref 150–400)
Platelets: 381 10*3/uL (ref 150–400)
RBC: 4.82 MIL/uL (ref 3.87–5.11)
RBC: 5.06 MIL/uL (ref 3.87–5.11)
RDW: 16.4 % — ABNORMAL HIGH (ref 11.5–15.5)
RDW: 16.6 % — ABNORMAL HIGH (ref 11.5–15.5)
WBC: 18.2 10*3/uL — ABNORMAL HIGH (ref 4.0–10.5)
WBC: 17.9 10*3/uL — ABNORMAL HIGH (ref 4.0–10.5)
nRBC: 0 % (ref 0.0–0.2)
nRBC: 0 % (ref 0.0–0.2)

## 2020-06-13 NOTE — Progress Notes (Signed)
[facility] NOTE  Patient Care Team: Just, Laurita Quint, FNP as PCP - General (Family Medicine) Gavin Pound, MD as Consulting Physician (Rheumatology) Linda Hedges, DO as Consulting Physician (Obstetrics and Gynecology) Havery Moros, [name], MD as Consulting Physician (Gastroenterology)  CHIEF COMPLAINTS/PURPOSE OF CONSULTATION:  IDA  ASSESSMENT & PLAN:  No problem-specific Assessment & Plan notes found for this encounter.  No orders of the defined types were placed in this encounter.  This is a very pleasant 35 year old female patient with past medical history significant for rheumatoid arthritis on hydroxychloroquine now referred hematology for evaluation and recommendations regarding her severe iron deficiency. We recommended a trial of oral iron supplementation and RTC after 6 weeks. She is here for FU, complains of ongoing fatigue, SOB with exertion and PICA. She is hoping to get IV iron. Repeat labs consistent with  iron deficiency without much improvement and worsening anemia. We will proceed with intravenous Injectafer, discussed common side effects which include flulike symptoms, very rarely anaphylaxis and hypophosphatemia. She is agreeable Proceed with intravenous iron infusion.  Therapy plan placed.  2.  Leukocytosis predominantly neutrophilia, likely secondary to rheumatoid arthritis This is a chronic finding and no major changes.  We will continue to monitor.  RTC in 3 months with repeat labs.  Thank you for consulting [place] in the care of this patient please not hesitate to contact us with any questions or concerns.  HISTORY OF PRESENTING ILLNESS:   Laura [name] 35 y.o. female is here because of IDA.  This is a very pleasant 35 year old female patient who arrived to the appointment by herself for evaluation and recommendations for an iron deficiency anemia.    Interim History Since last visit, she has tried taking oral iron twice a day as  recommended.  She continues to feel tired, short of breath with exertion and has noticed pica, eating a lot of ice.  She denies any unusual bleeding in her stool or in her urine. She has ongoing menstrual cycles.  She tolerated oral iron reasonably well but she has not noticed any improvement and was hoping that she may be able to get some intravenous iron. Her rheumatoid arthritis is not very well controlled, her rheumatologist is hoping to switch her to Humira.  REVIEW OF SYSTEMS:   Constitutional: Denies fevers, chills or abnormal night sweats Eyes: Denies blurriness of vision, double vision or watery eyes Ears, nose, mouth, throat, and face: Denies mucositis or sore throat Respiratory: Denies cough, dyspnea or wheezes Cardiovascular: Denies palpitation, chest discomfort or lower extremity swelling Gastrointestinal:  Denies nausea, heartburn or change in bowel habits Skin: Denies abnormal skin rashes Lymphatics: Denies new lymphadenopathy or easy bruising Neurological:Denies numbness, tingling or new weaknesses Behavioral/Psych: Mood is stable, no new changes  All other systems were reviewed with the patient and are negative.  MEDICAL HISTORY:  Past Medical History:  Diagnosis Date  . Anxiety   . Arthritis   . Depression   . Depression    [employer]reesia 02/25/2020  . Frequent headaches   . History of chicken pox   . Otitis media    had tubes as child, then according to pt adipose tissue used to repair ear drum  . Rheumatoid arthritis ([place])   . UTI (lower urinary tract infection)     SURGICAL HISTORY: Past Surgical History:  Procedure Laterality Date  . APPENDECTOMY  2012  . HYMENECTOMY  2000  . TONSILECTOMY, ADENOIDECTOMY, BILATERAL MYRINGOTOMY AND TUBES      SOCIAL  HISTORY: Social History   Socioeconomic History  . Marital status: Single    Spouse name: Not on file  . Number of children: 0  . Years of education: Not on file  . Highest education level: Not on file   Occupational History  . Occupation: Teacher, music: [employer]  Tobacco Use  . Smoking status: Never Smoker  . Smokeless tobacco: Never Used  Substance and Sexual Activity  . Alcohol use: Yes    Alcohol/week: 0.0 standard drinks    Comment: occasionally   . Drug use: No  . Sexual activity: Not Currently    Birth control/protection: Pill  Other Topics Concern  . Not on file  Social History Narrative   Work or School: Quarry manager for [employer] Situation: lives with parents      Spiritual Beliefs: Christian       Lifestyle: no regular exercise; diet is terrible      Social Determinants of Radio broadcast assistant Strain: Not on file  Food Insecurity: Not on file  Transportation Needs: Not on file  Physical Activity: Not on file  Stress: Not on file  Social Connections: Not on file  Intimate Partner Violence: Not on file    FAMILY HISTORY: Family History  Problem Relation Age of Onset  . Arthritis Paternal Grandmother   . Cancer Maternal Grandmother   . Cancer Maternal Grandfather   . Liver cancer Paternal Grandfather     ALLERGIES:  is allergic to kenalog [triamcinolone acetonide] and amoxicillin.  MEDICATIONS:  Current Outpatient Medications  Medication Sig Dispense Refill  . cholecalciferol (VITAMIN D3) 25 MCG (1000 UNIT) tablet Take 1,000 Units by mouth daily.    Marland Kitchen escitalopram (LEXAPRO) 20 MG tablet Take 1 tablet (20 mg total) by mouth at bedtime. 90 tablet 3  . ferrous gluconate (FERGON) 324 MG tablet Take 1 tablet (324 mg total) by mouth daily with breakfast. 90 tablet 1  . hydroxychloroquine (PLAQUENIL) 200 MG tablet Take 1 tablet (200 mg total) by mouth 2 (two) times daily. 60 tablet 0  . LORazepam (ATIVAN) 0.5 MG tablet Take 1 tablet (0.5 mg total) by mouth every 8 (eight) hours as needed for anxiety. 10 tablet 0  . norgestimate-ethinyl estradiol (ORTHO-CYCLEN) 0.25-35 MG-MCG tablet Take 1 tablet by mouth daily.    . predniSONE  (DELTASONE) 5 MG tablet Take by mouth.     No current facility-administered medications for this visit.     PHYSICAL EXAMINATION: ECOG PERFORMANCE STATUS: 1 - Symptomatic but completely ambulatory  Vitals:   06/13/20 1537  BP: 133/80  Pulse: 78  Resp: 18  Temp: 98.4 F (36.9 C)  SpO2: 99%   Filed Weights   06/13/20 1537  Weight: 266 lb 12.8 oz (121 kg)    GENERAL:alert, no distress and comfortable, obese. SKIN: skin color, texture, turgor are normal, no rashes or significant lesions EYES: normal, mild pallor, sclera clear OROPHARYNX:no exudate, no erythema and lips, buccal mucosa, and tongue normal  NECK: supple, thyroid normal size, non-tender, without nodularity LYMPH:  no palpable lymphadenopathy in the cervical, axillary or inguinal LUNGS: clear to auscultation and percussion with normal breathing effort HEART: regular rate & rhythm and no murmurs and no lower extremity edema ABDOMEN:abdomen soft, non-tender and normal bowel sounds Musculoskeletal:no cyanosis of digits and no clubbing  PSYCH: alert & oriented x 3 with fluent speech NEURO: no focal motor/sensory deficits  LABORATORY DATA:  I have reviewed the  data as listed Lab Results  Component Value Date   WBC 18.2 (H) 06/13/2020   HGB 10.7 (L) 06/13/2020   HCT 36.6 06/13/2020   MCV 75.9 (L) 06/13/2020   PLT 361 06/13/2020     Chemistry      Component Value Date/Time   NA 139 01/13/2020 1144   K 4.9 01/13/2020 1144   CL 100 01/13/2020 1144   CO2 22 01/13/2020 1144   BUN 8 01/13/2020 1144   CREATININE 0.92 01/13/2020 1144   CREATININE 0.80 04/29/2012 1724      Component Value Date/Time   CALCIUM 9.2 01/13/2020 1144   ALKPHOS 97 01/13/2020 1144   AST 9 01/13/2020 1144   ALT 7 01/13/2020 1144   BILITOT <0.2 01/13/2020 1144     I have reviewed all labs. Anemia continues to worsen, last hemoglobin was 11.7 g, now 10.7 g Ferritin of 16. Ongoing leukocytosis,, predominantly neutrophilia likely  related to rheumatoid arthritis No thrombocytosis, no basophilia reported  RADIOGRAPHIC STUDIES: I have personally reviewed the radiological images as listed and agreed with the findings in the report. No results found.  All questions were answered. The patient knows to call the clinic with any problems, questions or concerns. I spent a total of 30 minutes in the care of this patient including history and physical, review of records, counseling and coordination of care.     Benay Pike, MD 06/13/2020 4:04 PM

## 2020-06-14 ENCOUNTER — Telehealth: Payer: Self-pay | Admitting: Hematology and Oncology

## 2020-06-14 ENCOUNTER — Encounter: Payer: Self-pay | Admitting: Hematology and Oncology

## 2020-06-14 LAB — IRON AND TIBC
Iron: 14 ug/dL — ABNORMAL LOW (ref 41–142)
Saturation Ratios: 3 % — ABNORMAL LOW (ref 21–57)
TIBC: 429 ug/dL (ref 236–444)
UIBC: 414 ug/dL — ABNORMAL HIGH (ref 120–384)

## 2020-06-14 LAB — FERRITIN: Ferritin: 16 ng/mL (ref 11–307)

## 2020-06-14 MED ORDER — DIPHENHYDRAMINE HCL 25 MG PO CAPS
ORAL_CAPSULE | ORAL | Status: AC
  Filled 2020-06-14: qty 2

## 2020-06-14 MED ORDER — ACETAMINOPHEN 325 MG PO TABS
ORAL_TABLET | ORAL | Status: AC
  Filled 2020-06-14: qty 2

## 2020-06-14 NOTE — Telephone Encounter (Signed)
No LOS notes put in 3/2. No appointments scheduled at this time.

## 2020-06-18 ENCOUNTER — Other Ambulatory Visit: Payer: Self-pay | Admitting: Hematology and Oncology

## 2020-07-30 ENCOUNTER — Ambulatory Visit (HOSPITAL_BASED_OUTPATIENT_CLINIC_OR_DEPARTMENT_OTHER): Payer: Managed Care, Other (non HMO) | Admitting: Nurse Practitioner

## 2020-07-30 ENCOUNTER — Encounter (HOSPITAL_BASED_OUTPATIENT_CLINIC_OR_DEPARTMENT_OTHER): Payer: Self-pay | Admitting: Nurse Practitioner

## 2020-07-30 ENCOUNTER — Other Ambulatory Visit: Payer: Self-pay

## 2020-07-30 VITALS — BP 124/88 | HR 62 | Ht 66.0 in | Wt 265.4 lb

## 2020-07-30 DIAGNOSIS — Z6841 Body Mass Index (BMI) 40.0 and over, adult: Secondary | ICD-10-CM

## 2020-07-30 DIAGNOSIS — M069 Rheumatoid arthritis, unspecified: Secondary | ICD-10-CM

## 2020-07-30 DIAGNOSIS — M546 Pain in thoracic spine: Secondary | ICD-10-CM

## 2020-07-30 DIAGNOSIS — R141 Gas pain: Secondary | ICD-10-CM

## 2020-07-30 DIAGNOSIS — F330 Major depressive disorder, recurrent, mild: Secondary | ICD-10-CM

## 2020-07-30 DIAGNOSIS — L219 Seborrheic dermatitis, unspecified: Secondary | ICD-10-CM | POA: Diagnosis not present

## 2020-07-30 DIAGNOSIS — Z7689 Persons encountering health services in other specified circumstances: Secondary | ICD-10-CM | POA: Insufficient documentation

## 2020-07-30 DIAGNOSIS — F411 Generalized anxiety disorder: Secondary | ICD-10-CM | POA: Diagnosis not present

## 2020-07-30 DIAGNOSIS — R7303 Prediabetes: Secondary | ICD-10-CM

## 2020-07-30 DIAGNOSIS — E559 Vitamin D deficiency, unspecified: Secondary | ICD-10-CM

## 2020-07-30 DIAGNOSIS — D500 Iron deficiency anemia secondary to blood loss (chronic): Secondary | ICD-10-CM

## 2020-07-30 DIAGNOSIS — R194 Change in bowel habit: Secondary | ICD-10-CM | POA: Insufficient documentation

## 2020-07-30 DIAGNOSIS — G8929 Other chronic pain: Secondary | ICD-10-CM

## 2020-07-30 DIAGNOSIS — E282 Polycystic ovarian syndrome: Secondary | ICD-10-CM

## 2020-07-30 HISTORY — DX: Flatulence: R14.1

## 2020-07-30 HISTORY — DX: Persons encountering health services in other specified circumstances: Z76.89

## 2020-07-30 MED ORDER — KETOCONAZOLE 2 % EX SHAM: 1.0000 "application " | MEDICATED_SHAMPOO | CUTANEOUS | 0 refills | Status: DC

## 2020-07-30 MED ORDER — MELOXICAM 15 MG PO TABS: 15.0000 mg | ORAL_TABLET | Freq: Every day | ORAL | 1 refills | Status: DC

## 2020-07-30 MED ORDER — CYCLOBENZAPRINE HCL 10 MG PO TABS: 5.0000 mg | ORAL_TABLET | Freq: Every day | ORAL | 1 refills | Status: DC

## 2020-07-30 MED ORDER — ESCITALOPRAM OXALATE 20 MG PO TABS
20.0000 mg | ORAL_TABLET | Freq: Every day | ORAL | 3 refills | Status: DC
Start: 2020-07-30 — End: 2021-06-13

## 2020-07-30 NOTE — Assessment & Plan Note (Signed)
No supplements at this time. Will order labs for evaluation and determine if additional supplements are needed. TSH also measured today.

## 2020-07-30 NOTE — Assessment & Plan Note (Signed)
History of iron deficiency anemia with need for iron transfusions.  She is seen by hematology for this and is due for an infusion soon. CBC drawn three months ago shows hemoglobin of 11 with iron levels at 14 despite oral treatments.  Will contact the hematology to let them know of the numbers and determine if infusion can be moved up sooner than her 6 month appointment. This could be a major contributor to her fatigue and other symptoms.

## 2020-07-30 NOTE — Assessment & Plan Note (Signed)
Thoracic midline back pain present with notable spasms of the surrounding musculature. Possible slight spinal curvature to the left noted on palpation, but difficult to fully determine due to muscle tension. She reportedly does have x-rays from her chiropractor therefore we will try to get those for examination. Will start with meloxicam daily for pain and swelling and flexeril at bedtime for spasms.  Recommend gentle stretching exercises and heat to the area for pain.

## 2020-07-30 NOTE — Assessment & Plan Note (Signed)
Anxiety and Depression symptoms well controlled on lexapro 20mg  daily.  Recommend increase in physical activity and monitoring for signs of worsening depression or anxiety symptoms.  Refills provided today. Will follow-up in 6 months

## 2020-07-30 NOTE — Assessment & Plan Note (Signed)
History of pre-diabetes not on any medication. Will check labs today.  Discussed low carbohydrate diet to help reduce changes. Given that she also has diagnosed PCOS we will need close monitoring to ensure that she does not develop diabetes.  Discussed treatment option of metformin. She will follow-up if interested

## 2020-07-30 NOTE — Assessment & Plan Note (Signed)
Pt reported diagnosis based on irregular menses and polycystic ovaries noted on transvaginal ultrasound. Hormones reportedly normal.  OCP stopped in November and having periods about every 40 days. No excessive bleeding.  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 Very traumatic experience. Unknown initiation of menses due to hymen presence. Discussed that there are some options for menstrual regulation, if she wishes. At this time she does not want any form of medication for menses.

## 2020-07-30 NOTE — Assessment & Plan Note (Signed)
RA well controlled with hydroxychloroquine and Humira.  No overt concerns present today.  Followed by rheumatology- Dr. Ouida Sills.

## 2020-07-30 NOTE — Assessment & Plan Note (Addendum)
Anxiety and Depression symptoms well controlled on lexapro 20mg  daily.  Recommend increase in physical activity and monitoring for signs of worsening depression symptoms.  Refills provided today. Will follow-up in 6 months

## 2020-07-30 NOTE — Progress Notes (Signed)
New Patient Office Visit  Subjective:  Patient ID: Laura [name], female    DOB: 07-Aug-1985  Age: 35 y.o. MRN: [id number]  CC:  Chief Complaint  Patient presents with  . Establish Care    HPI Laura [name] presents to establish care with this practice today. She was previously seen by Merleen Nicely Just, NP at [facility] prior to the office closing. She is also followed by hematology for chronic anemia.   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 She reports that she feels safe in her home and her work environment.   She endorses thoracic and lumbar back pain frequently. She stands leaning forward for a good part of her 12 hour shift and feels this may have something to do with this. She does see a chiropractor and she reports that this has helped her headaches, but she is still experiencing the pain in her thoracic region. She reports that the area will go numb and on occasion she will feel electric-like shocks in the midline area.   She also endorses concern for fatigue, weight gain, hair loss, dandruff, and itchy scalp. She has not ever had an issue with itching scalp or dandruff. She reports that she does not wash her hair daily to prevent excessive drying.   She has a history of RA for which she takes humira and hydroxychloroquine for treatment and reports that this is working well for her. She is followed by rheumatology for this condition.   She has a history of anxiety and depression and takes escitalopram 20mg  daily and reports that this helps control her anxiety and depressive symptoms well. She needs refills on this medication today.  Depression screen [facility] 2/9 07/30/2020 02/28/2020 01/13/2020  Decreased Interest 0 0 0  Down, Depressed, Hopeless 0 0 0  PHQ - 2 Score 0 0 0  Altered sleeping 0 - -  Tired, decreased energy 3 - -  Change in appetite 0 - -  Feeling bad or failure about  yourself  0 - -  Trouble concentrating 0 - -  Moving slowly or fidgety/restless 0 - -  Suicidal thoughts 0 - -  PHQ-9 Score 3 - -  Difficult doing work/chores Somewhat difficult - -   GAD 7 : Generalized Anxiety Score 07/30/2020 11/18/2018 10/27/2017 09/24/2017  Nervous, Anxious, on Edge 0 0 0 3  Control/stop worrying 0 0 0 3  Worry too much - different things 0 0 0 3  Trouble relaxing 0 1 0 3  Restless 0 0 0 3  Easily annoyed or irritable 0 1 1 3   Afraid - awful might happen 0 0 0 3  Total GAD 7 Score 0 2 1 21   Anxiety Difficulty Not difficult at all Not difficult at all - Not difficult at all    Past Medical History:  Diagnosis Date  . Anemia   . Anxiety   . Arthritis   . Depression   . Depression    Phreesia 02/25/2020  . Frequent headaches   . History of chicken pox   . Otitis media    had tubes as child, then according to pt adipose tissue used to repair ear drum  . Rheumatoid arthritis ([place])   . UTI (lower urinary tract infection)     Past Surgical History:  Procedure Laterality Date  . APPENDECTOMY  2012  . HYMENECTOMY  2000  .  TONSILECTOMY, ADENOIDECTOMY, BILATERAL MYRINGOTOMY AND TUBES      Family History  Problem Relation Age of Onset  . Arthritis Paternal Grandmother   . Cancer Maternal Grandmother   . Cancer Maternal Grandfather   . Liver cancer Paternal Grandfather     Social History   Socioeconomic History  . Marital status: Single    Spouse name: Not on file  . Number of children: 0  . Years of education: Not on file  . Highest education level: Not on file  Occupational History  . Occupation: Teacher, music: [employer]  Tobacco Use  . Smoking status: Never Smoker  . Smokeless tobacco: Never Used  Vaping Use  . Vaping Use: Never used  Substance and Sexual Activity  . Alcohol use: Yes    Alcohol/week: 0.0 standard drinks    Comment: occasionally   . Drug use: No  . Sexual activity: Not Currently    Birth control/protection:  Pill  Other Topics Concern  . 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       Social Determinants of Health   Financial Resource Strain: Not on file  Food Insecurity: Not on file  Transportation Needs: Not on file  Physical Activity: Not on file  Stress: Not on file  Social Connections: Not on file  Intimate Partner Violence: Not on file    ROS Review of Systems  Constitutional: Positive for fatigue and unexpected weight change. Negative for activity change, appetite change and fever.  HENT: Negative for ear pain, postnasal drip, rhinorrhea, sinus pressure, sinus pain and sore throat.   Eyes: Negative for visual disturbance.  Respiratory: Negative for cough, chest tightness and shortness of breath.   Cardiovascular: Negative for chest pain, palpitations and leg swelling.  Gastrointestinal: Negative for abdominal distention, abdominal pain, constipation and diarrhea.  Endocrine: Negative for cold intolerance and heat intolerance.  Genitourinary: Positive for menstrual problem. Negative for dysuria, vaginal bleeding, vaginal discharge and vaginal pain.       Dx of PCOS determined by irregular menses and polycystic ovaries.   Musculoskeletal: Positive for arthralgias and myalgias.  Neurological: Negative for tremors, weakness, light-headedness, numbness and headaches.  Hematological: Negative for adenopathy. Does not bruise/bleed easily.  Psychiatric/Behavioral: Negative for behavioral problems, decreased concentration, dysphoric mood, self-injury, sleep disturbance and suicidal ideas. The patient is not nervous/anxious.     Objective:   Today's Vitals: BP 124/88   Pulse 62   Ht 5\' 6"  (1.676 m)   Wt 265 lb 6.4 oz (120.4 kg)   SpO2 100%   BMI 42.84 kg/m   Physical Exam Vitals and nursing note  reviewed.  Constitutional:      General: She is not in acute distress.    Appearance: Normal appearance.  HENT:     Head: Normocephalic and atraumatic.     Right Ear: Ear canal and external ear normal.     Left Ear: Ear canal and external ear normal.     Ears:     Comments: Evidence of myringotomy bilaterally with repair.    Nose: Nose normal.     Mouth/Throat:     Mouth: Mucous membranes are moist.     Pharynx: Oropharynx is clear.  Eyes:     Extraocular Movements: Extraocular movements intact.  Conjunctiva/sclera: Conjunctivae normal.     Pupils: Pupils are equal, round, and reactive to light.  Neck:     Vascular: No carotid bruit.  Cardiovascular:     Rate and Rhythm: Normal rate and regular rhythm.     Pulses: Normal pulses.     Heart sounds: Normal heart sounds. No murmur heard.   Pulmonary:     Effort: Pulmonary effort is normal.     Breath sounds: Normal breath sounds.  Abdominal:     General: Abdomen is flat. Bowel sounds are normal. There is no distension.     Palpations: Abdomen is soft.     Tenderness: There is no abdominal tenderness. There is no right CVA tenderness, left CVA tenderness or guarding.  Musculoskeletal:       Arms:     Cervical back: Normal range of motion. No rigidity or tenderness.     Right lower leg: No edema.     Left lower leg: No edema.  Lymphadenopathy:     Cervical: No cervical adenopathy.  Skin:    General: Skin is warm and dry.     Capillary Refill: Capillary refill takes less than 2 seconds.  Neurological:     General: No focal deficit present.     Mental Status: She is alert and oriented to person, place, and time.  Psychiatric:        Mood and Affect: Mood normal.        Behavior: Behavior normal.        Thought Content: Thought content normal.        Judgment: Judgment normal.     Assessment & Plan:   Problem List Items Addressed This Visit      Endocrine   Polycystic ovary syndrome    Pt reported diagnosis  based on irregular menses and polycystic ovaries noted on transvaginal ultrasound. Hormones reportedly normal.  OCP stopped in November and having periods about every 40 days. No excessive bleeding.  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 Very traumatic experience. Unknown initiation of menses due to hymen presence. Discussed that there are some options for menstrual regulation, if she wishes. At this time she does not want any form of medication for menses.          Musculoskeletal and Integument   Rheumatoid arthritis (HCC)    RA well controlled with hydroxychloroquine and Humira.  No overt concerns present today.  Followed by rheumatology- Dr. Ouida Sills.       Relevant Medications   HUMIRA PEN 40 MG/0.4ML PNKT   escitalopram (LEXAPRO) 20 MG tablet   cyclobenzaprine (FLEXERIL) 10 MG tablet   Other Relevant Orders   Comprehensive metabolic panel   Thyroid Panel With TSH   VITAMIN D 25 Hydroxy (Vit-D Deficiency, Fractures)   Seborrheic dermatitis of scalp    Symptoms and presentation consistent with seborrheic dermatitis. Intermittent small white flakes present near the crown and frontal region. No evidence of thick plaques or infestation.  Will monitor for TSH due to additional concerns of thinning hair.  Recommend Nizoral shampoo every other day for 10 days then once a week for maintenance.  Follow-up if symptoms fail to improve.       Relevant Medications   escitalopram (LEXAPRO) 20 MG tablet   cyclobenzaprine (FLEXERIL) 10 MG tablet   Other Relevant Orders   Comprehensive metabolic panel   Thyroid Panel With TSH   VITAMIN D 25 Hydroxy (Vit-D  Deficiency, Fractures)     Other   Obesity    BMI 42.84 today with reported steady weight gain.  She is active in her job and on her feet for 12 hours at a time, but does not engage in regular physical activity and reports that her diet is poor.  Recommend  monitoring food choices to decrease saturated fats and carbohydrates. Monitor portion sizes and avoid high calorie snacks and sodas during the day.  Daily exercise of 20 minute walk recommended with gradual increase in distance and speed.  Will monitor TSH today to determine if this could be the cause of some symptoms.  Follow-up in 6 months      Relevant Medications   escitalopram (LEXAPRO) 20 MG tablet   cyclobenzaprine (FLEXERIL) 10 MG tablet   Other Relevant Orders   Comprehensive metabolic panel   Thyroid Panel With TSH   VITAMIN D 25 Hydroxy (Vit-D Deficiency, Fractures)   Mild episode of recurrent major depressive disorder (HCC)    Anxiety and Depression symptoms well controlled on lexapro 20mg  daily.  Recommend increase in physical activity and monitoring for signs of worsening depression symptoms.  Refills provided today. Will follow-up in 6 months      Relevant Medications   escitalopram (LEXAPRO) 20 MG tablet   cyclobenzaprine (FLEXERIL) 10 MG tablet   Other Relevant Orders   Comprehensive metabolic panel   Thyroid Panel With TSH   VITAMIN D 25 Hydroxy (Vit-D Deficiency, Fractures)   GAD (generalized anxiety disorder)    Anxiety and Depression symptoms well controlled on lexapro 20mg  daily.  Recommend increase in physical activity and monitoring for signs of worsening depression or anxiety symptoms.  Refills provided today. Will follow-up in 6 months      Relevant Medications   escitalopram (LEXAPRO) 20 MG tablet   cyclobenzaprine (FLEXERIL) 10 MG tablet   Other Relevant Orders   Comprehensive metabolic panel   Thyroid Panel With TSH   VITAMIN D 25 Hydroxy (Vit-D Deficiency, Fractures)   Prediabetes    History of pre-diabetes not on any medication. Will check labs today.  Discussed low carbohydrate diet to help reduce changes. Given that she also has diagnosed PCOS we will need close monitoring to ensure that she does not develop diabetes.  Discussed treatment  option of metformin. She will follow-up if interested      Relevant Medications   escitalopram (LEXAPRO) 20 MG tablet   cyclobenzaprine (FLEXERIL) 10 MG tablet   Other Relevant Orders   Comprehensive metabolic panel   Thyroid Panel With TSH   VITAMIN D 25 Hydroxy (Vit-D Deficiency, Fractures)   Vitamin D deficiency    No supplements at this time. Will order labs for evaluation and determine if additional supplements are needed. TSH also measured today.       Relevant Medications   escitalopram (LEXAPRO) 20 MG tablet   cyclobenzaprine (FLEXERIL) 10 MG tablet   Other Relevant Orders   Comprehensive metabolic panel   Thyroid Panel With TSH   VITAMIN D 25 Hydroxy (Vit-D Deficiency, Fractures)   Iron deficiency anemia    History of iron deficiency anemia with need for iron transfusions.  She is seen by hematology for this and is due for an infusion soon. CBC drawn three months ago shows hemoglobin of 11 with iron levels at 14 despite oral treatments.  Will contact the hematology to let them know of the numbers and determine if infusion can be moved up sooner than her 6 month appointment. This could  be a major contributor to her fatigue and other symptoms.       Relevant Medications   escitalopram (LEXAPRO) 20 MG tablet   cyclobenzaprine (FLEXERIL) 10 MG tablet   Other Relevant Orders   Comprehensive metabolic panel   Thyroid Panel With TSH   VITAMIN D 25 Hydroxy (Vit-D Deficiency, Fractures)   Chronic midline thoracic back pain    Thoracic midline back pain present with notable spasms of the surrounding musculature. Possible slight spinal curvature to the left noted on palpation, but difficult to fully determine due to muscle tension. She reportedly does have x-rays from her chiropractor therefore we will try to get those for examination. Will start with meloxicam daily for pain and swelling and flexeril at bedtime for spasms.  Recommend gentle stretching exercises and heat to the  area for pain.        Relevant Medications   HUMIRA PEN 40 MG/0.4ML PNKT   escitalopram (LEXAPRO) 20 MG tablet   cyclobenzaprine (FLEXERIL) 10 MG tablet   Other Relevant Orders   Comprehensive metabolic panel   Thyroid Panel With TSH   VITAMIN D 25 Hydroxy (Vit-D Deficiency, Fractures)   Establishing care with new doctor, encounter for - Primary    Review of current and past medical history, medication, social history, surgical history and family history performed.  CPE today for new patient exam with labs.  No repeat of CBC due to recent draw without recent replacement therapy.          Outpatient Encounter Medications as of 07/30/2020  Medication Sig  . cholecalciferol (VITAMIN D3) 25 MCG (1000 UNIT) tablet Take 1,000 Units by mouth daily.  . cyclobenzaprine (FLEXERIL) 10 MG tablet Take 0.5-1 tablets (5-10 mg total) by mouth at bedtime. Caution: can cause drowsiness  . escitalopram (LEXAPRO) 20 MG tablet Take 1 tablet (20 mg total) by mouth at bedtime.  Marland Kitchen HUMIRA PEN 40 MG/0.4ML PNKT SMARTSIG:40 Milligram(s) SUB-Q Every 2 Weeks  . hydroxychloroquine (PLAQUENIL) 200 MG tablet Take 1 tablet (200 mg total) by mouth 2 (two) times daily.  . [DISCONTINUED] ferrous gluconate (FERGON) 324 MG tablet Take 1 tablet (324 mg total) by mouth daily with breakfast.  . [DISCONTINUED] ketoconazole (NIZORAL) 2 % shampoo Apply 1 application topically 2 (two) times a week.  . [DISCONTINUED] LORazepam (ATIVAN) 0.5 MG tablet Take 1 tablet (0.5 mg total) by mouth every 8 (eight) hours as needed for anxiety.  . [DISCONTINUED] meloxicam (MOBIC) 15 MG tablet Take 1 tablet (15 mg total) by mouth daily.  . [DISCONTINUED] predniSONE (DELTASONE) 5 MG tablet Take by mouth.  . [DISCONTINUED] escitalopram (LEXAPRO) 20 MG tablet Take 1 tablet (20 mg total) by mouth at bedtime.   No facility-administered encounter medications on file as of 07/30/2020.    Follow-up: Return in about 6 months (around 01/29/2021).    Orma Render, NP

## 2020-07-30 NOTE — Assessment & Plan Note (Signed)
Symptoms and presentation consistent with seborrheic dermatitis. Intermittent small white flakes present near the crown and frontal region. No evidence of thick plaques or infestation.  Will monitor for TSH due to additional concerns of thinning hair.  Recommend Nizoral shampoo every other day for 10 days then once a week for maintenance.  Follow-up if symptoms fail to improve.

## 2020-07-30 NOTE — Assessment & Plan Note (Signed)
BMI 42.84 today with reported steady weight gain.  She is active in her job and on her feet for 12 hours at a time, but does not engage in regular physical activity and reports that her diet is poor.  Recommend monitoring food choices to decrease saturated fats and carbohydrates. Monitor portion sizes and avoid high calorie snacks and sodas during the day.  Daily exercise of 20 minute walk recommended with gradual increase in distance and speed.  Will monitor TSH today to determine if this could be the cause of some symptoms.  Follow-up in 6 months

## 2020-07-30 NOTE — Assessment & Plan Note (Signed)
Review of current and past medical history, medication, social history, surgical history and family history performed.  CPE today for new patient exam with labs.  No repeat of CBC due to recent draw without recent replacement therapy.

## 2020-07-30 NOTE — Patient Instructions (Signed)
Thank you for choosing [place] [facility] for your Primary Care needs. I am excited for the opportunity to partner with you to meet your health care goals. It was a pleasure meeting you today!  I am an Adult-Geriatric Nurse Practitioner with a background in caring for patients for more than 20 years. I received my Paediatric nurse in Nursing and my Doctor of Nursing Practice degrees at [employer]. I received additional fellowship training in primary care and sports medicine after receiving my doctorate degree. I provide primary care and sports medicine services to patients age 25 and older within this office. I am also a provider with the [facility] and the director of the APP Fellowship with [facility].  I am a [place] native, but have called the [place] area home for nearly 20 years and am proud to be a member of this community.   I am passionate about providing the best service to you through preventive medicine and supportive care. I consider you a part of the medical team and value your input. I work diligently to ensure that you are heard and your needs are met in a safe and effective manner. I want you to feel comfortable with me as your provider and want you to know that your health concerns are important to me.   For your information, our office hours are Monday- Friday 8:00 AM - 5:00 PM At this time I am not in the office on Wednesdays.  If you have questions or concerns, please call our office at [phone] or send [place] a MyChart message and we will respond as quickly as possible.   For all urgent or time sensitive needs we ask that you please call the office to avoid delays. MyChart is not constantly monitored and replies may take up to 72 business hours.  MyChart Policy: . MyChart allows for you to see your visit notes, after visit summary, provider recommendations, lab and tests results, make an appointment,  request refills, and contact your provider or the office for non-urgent questions or concerns.  . Providers are seeing patients during normal business hours and do not have built in time to review MyChart messages. We ask that you allow a minimum of 72 business hours for MyChart message responses.  . Complex MyChart concerns may require a visit. Your provider may request you schedule a virtual or in person visit to ensure we are providing the best care possible. . MyChart messages sent after 4:00 PM on Friday will not be received by the provider until Monday morning.    Lab and Test Results: . You will receive your lab and test results on MyChart as soon as they are completed and results have been sent by the lab or testing facility. Due to this service, you will receive your results BEFORE your provider.  . Please allow a minimum of 72 business hours for your provider to receive and review lab and test results and contact you about.   . Most lab and test result comments from the provider will be sent through [place]. Your provider may recommend changes to the plan of care, follow-up visits, repeat testing, ask questions, or request an office visit to discuss these results. You may reply directly to this message or call the office at [phone] to provide information for the provider or set up an appointment. . In some instances, you will be called with test results and recommendations. Please let  us know if this is preferred and we will make note of this in your chart to provide this for you.    . If you have not heard a response to your lab or test results in 72 business hours, please call the office to let us know.   After Hours: . For all non-emergency after hours needs, please call the office at [phone] and select the option to reach the on-call provider service. On-call services are shared between multiple [facility] offices and therefore it will not be possible to speak directly with your  provider. On-call providers may provide medical advice and recommendations, but are unable to provide refills for maintenance medications.  . For all emergency or urgent medical needs after normal business hours, we recommend that you seek care at the closest Urgent Care or Emergency Department to ensure appropriate treatment in a timely manner.  [facility] [place] [facility] [place] has a 24 hour emergency room located on the ground floor for your convenience.    Please do not hesitate to reach out to [place] with concerns.   Thank you, again, for choosing me as your health care partner. I appreciate your trust and look forward to learning more about you.   Worthy Keeler, DNP, AGNP-c ___________________________________________________________________________________

## 2020-08-15 ENCOUNTER — Other Ambulatory Visit (HOSPITAL_BASED_OUTPATIENT_CLINIC_OR_DEPARTMENT_OTHER): Payer: Self-pay | Admitting: Nurse Practitioner

## 2020-08-16 LAB — VITAMIN D 25 HYDROXY (VIT D DEFICIENCY, FRACTURES): Vit D, 25-Hydroxy: 14 ng/mL — ABNORMAL LOW (ref 30.0–100.0)

## 2020-08-16 LAB — THYROID PANEL WITH TSH
Free Thyroxine Index: 1.4 (ref 1.2–4.9)
T3 Uptake Ratio: 22 % — ABNORMAL LOW (ref 24–39)
T4, Total: 6.3 ug/dL (ref 4.5–12.0)
TSH: 1.7 u[IU]/mL (ref 0.450–4.500)

## 2020-08-16 LAB — COMPREHENSIVE METABOLIC PANEL
ALT: 33 IU/L — ABNORMAL HIGH (ref 0–32)
AST: 25 IU/L (ref 0–40)
Albumin/Globulin Ratio: 1.4 (ref 1.2–2.2)
Albumin: 4.1 g/dL (ref 3.8–4.8)
Alkaline Phosphatase: 95 IU/L (ref 44–121)
BUN/Creatinine Ratio: 11 (ref 9–23)
BUN: 10 mg/dL (ref 6–20)
Bilirubin Total: <0.2 mg/dL (ref 0.0–1.2)
CO2: 23 mmol/L (ref 20–29)
Calcium: 9.5 mg/dL (ref 8.7–10.2)
Chloride: 101 mmol/L (ref 96–106)
Creatinine, Ser: 0.9 mg/dL (ref 0.57–1.00)
Globulin, Total: 3 g/dL (ref 1.5–4.5)
Glucose: 90 mg/dL (ref 65–99)
Potassium: 5 mmol/L (ref 3.5–5.2)
Sodium: 138 mmol/L (ref 134–144)
Total Protein: 7.1 g/dL (ref 6.0–8.5)
eGFR: 85 mL/min/{1.73_m2} (ref >59)

## 2020-08-17 NOTE — Progress Notes (Signed)
ALT is very slightly elevated. Recommend avoiding Tylenol overuse and limit alcohol consumption.   No concerns with thyroid function.   Vitamin D is very low. If not taking Vitamin D still, we will need to add supplementation. If taking Vitamin D- need to investigate why levels are so low.

## 2020-08-20 ENCOUNTER — Telehealth (HOSPITAL_BASED_OUTPATIENT_CLINIC_OR_DEPARTMENT_OTHER): Payer: Self-pay

## 2020-08-20 NOTE — Telephone Encounter (Signed)
-----   Message from Sara E Early, NP sent at 08/17/2020  6:44 PM EDT ----- ALT is very slightly elevated. Recommend avoiding Tylenol overuse and limit alcohol consumption.   No concerns with thyroid function.   Vitamin D is very low. If not taking Vitamin D still, we will need to add supplementation. If taking Vitamin D- need to investigate why levels are so low.  

## 2020-08-20 NOTE — Telephone Encounter (Signed)
Results released by Worthy Keeler, AGNP.  Called patient to go over lab results.  Patient is aware and agreeable with recommendations.  Instructed patient to contact the office with any questions or concerns.

## 2020-08-20 NOTE — Telephone Encounter (Signed)
-----   Message from Orma Render, NP sent at 08/17/2020  6:44 PM EDT ----- ALT is very slightly elevated. Recommend avoiding Tylenol overuse and limit alcohol consumption.   No concerns with thyroid function.   Vitamin D is very low. If not taking Vitamin D still, we will need to add supplementation. If taking Vitamin D- need to investigate why levels are so low.

## 2020-08-20 NOTE — Telephone Encounter (Signed)
Results released by Sarabeth Early, AGNP and reviewed by patient via MyChart. Instructed patient to contact the office with any questions or concerns.  

## 2020-08-27 ENCOUNTER — Encounter: Payer: Self-pay | Admitting: Hematology and Oncology

## 2020-08-28 ENCOUNTER — Telehealth: Payer: Self-pay | Admitting: Hematology and Oncology

## 2020-08-28 NOTE — Telephone Encounter (Signed)
Scheduled appts per 5/16 sch msg. Called pt, no answer. Left msg with appts dates and times as well as locations.

## 2020-08-29 ENCOUNTER — Ambulatory Visit: Payer: Managed Care, Other (non HMO) | Admitting: Family Medicine

## 2020-08-29 ENCOUNTER — Telehealth: Payer: Self-pay | Admitting: Hematology and Oncology

## 2020-08-29 NOTE — Telephone Encounter (Signed)
Scheduled appts per 5/16 sch msg. Pt aware.  

## 2020-08-31 ENCOUNTER — Other Ambulatory Visit: Payer: Self-pay | Admitting: *Deleted

## 2020-08-31 ENCOUNTER — Ambulatory Visit: Payer: Managed Care, Other (non HMO)

## 2020-08-31 ENCOUNTER — Other Ambulatory Visit: Payer: Managed Care, Other (non HMO)

## 2020-09-03 ENCOUNTER — Other Ambulatory Visit: Payer: Self-pay

## 2020-09-03 ENCOUNTER — Other Ambulatory Visit: Payer: Self-pay | Admitting: Hematology and Oncology

## 2020-09-03 ENCOUNTER — Inpatient Hospital Stay: Payer: Managed Care, Other (non HMO)

## 2020-09-03 ENCOUNTER — Inpatient Hospital Stay: Payer: Managed Care, Other (non HMO) | Attending: Hematology and Oncology

## 2020-09-03 VITALS — BP 118/67 | HR 75 | Temp 98.4°F | Resp 17

## 2020-09-03 DIAGNOSIS — D509 Iron deficiency anemia, unspecified: Secondary | ICD-10-CM | POA: Insufficient documentation

## 2020-09-03 DIAGNOSIS — D72829 Elevated white blood cell count, unspecified: Secondary | ICD-10-CM | POA: Diagnosis not present

## 2020-09-03 LAB — CBC WITH DIFFERENTIAL/PLATELET
Abs Immature Granulocytes: 0.04 10*3/uL (ref 0.00–0.07)
Basophils Absolute: 0 10*3/uL (ref 0.0–0.1)
Basophils Relative: 0 %
Eosinophils Absolute: 0.4 10*3/uL (ref 0.0–0.5)
Eosinophils Relative: 6 %
HCT: 40.1 % (ref 36.0–46.0)
Hemoglobin: 11.7 g/dL — ABNORMAL LOW (ref 12.0–15.0)
Immature Granulocytes: 1 %
Lymphocytes Relative: 25 %
Lymphs Abs: 1.7 10*3/uL (ref 0.7–4.0)
MCH: 22.1 pg — ABNORMAL LOW (ref 26.0–34.0)
MCHC: 29.2 g/dL — ABNORMAL LOW (ref 30.0–36.0)
MCV: 75.7 fL — ABNORMAL LOW (ref 80.0–100.0)
Monocytes Absolute: 0.4 10*3/uL (ref 0.1–1.0)
Monocytes Relative: 6 %
Neutro Abs: 4.2 10*3/uL (ref 1.7–7.7)
Neutrophils Relative %: 62 %
Platelets: 315 10*3/uL (ref 150–400)
RBC: 5.3 MIL/uL — ABNORMAL HIGH (ref 3.87–5.11)
RDW: 16.6 % — ABNORMAL HIGH (ref 11.5–15.5)
WBC: 6.8 10*3/uL (ref 4.0–10.5)
nRBC: 0 % (ref 0.0–0.2)

## 2020-09-03 MED ORDER — DIPHENHYDRAMINE HCL 50 MG/ML IJ SOLN: 50.0000 mg | Freq: Once | INTRAMUSCULAR | Status: DC

## 2020-09-03 MED ORDER — HEPARIN SOD (PORK) LOCK FLUSH 100 UNIT/ML IV SOLN
500.0000 [IU] | Freq: Once | INTRAVENOUS | Status: DC | PRN
  Filled 2020-09-03: qty 5

## 2020-09-03 MED ORDER — SODIUM CHLORIDE 0.9 % IV SOLN: 750.0000 mg | Freq: Once | INTRAVENOUS | Status: DC

## 2020-09-03 MED ORDER — ALTEPLASE 2 MG IJ SOLR
2.0000 mg | Freq: Once | INTRAMUSCULAR | Status: DC | PRN
  Filled 2020-09-03: qty 2

## 2020-09-03 MED ORDER — FAMOTIDINE 20 MG IN NS 100 ML IVPB
20.0000 mg | Freq: Once | INTRAVENOUS | Status: AC
  Administered 2020-09-03: 20 mg via INTRAVENOUS
  Filled 2020-09-03: qty 100

## 2020-09-03 MED ORDER — ALBUTEROL SULFATE (2.5 MG/3ML) 0.083% IN NEBU
2.5000 mg | INHALATION_SOLUTION | Freq: Once | RESPIRATORY_TRACT | Status: DC | PRN
  Filled 2020-09-03: qty 3

## 2020-09-03 MED ORDER — METHYLPREDNISOLONE SODIUM SUCC 125 MG IJ SOLR: 125.0000 mg | Freq: Once | INTRAMUSCULAR | Status: DC | PRN

## 2020-09-03 MED ORDER — FAMOTIDINE IN NACL 20-0.9 MG/50ML-% IV SOLN: 20.0000 mg | Freq: Once | INTRAVENOUS | Status: DC

## 2020-09-03 MED ORDER — SODIUM CHLORIDE 0.9% FLUSH
10.0000 mL | Freq: Once | INTRAVENOUS | Status: DC | PRN
  Filled 2020-09-03: qty 10

## 2020-09-03 MED ORDER — EPINEPHRINE 0.3 MG/0.3ML IJ SOAJ: 0.3000 mg | Freq: Once | INTRAMUSCULAR | Status: DC | PRN

## 2020-09-03 MED ORDER — DIPHENHYDRAMINE HCL 50 MG/ML IJ SOLN
50.0000 mg | Freq: Once | INTRAMUSCULAR | Status: AC
  Administered 2020-09-03: 50 mg via INTRAVENOUS

## 2020-09-03 MED ORDER — DIPHENHYDRAMINE HCL 50 MG/ML IJ SOLN
INTRAMUSCULAR | Status: AC
  Filled 2020-09-03: qty 1

## 2020-09-03 MED ORDER — SODIUM CHLORIDE 0.9 % IV SOLN
Freq: Once | INTRAVENOUS | Status: AC
  Filled 2020-09-03: qty 250

## 2020-09-03 MED ORDER — SODIUM CHLORIDE 0.9 % IV SOLN
Freq: Once | INTRAVENOUS | Status: DC | PRN
  Filled 2020-09-03: qty 250

## 2020-09-03 MED ORDER — DIPHENHYDRAMINE HCL 50 MG/ML IJ SOLN: 50.0000 mg | Freq: Once | INTRAMUSCULAR | Status: DC | PRN

## 2020-09-03 MED ORDER — SODIUM CHLORIDE 0.9% FLUSH
3.0000 mL | Freq: Once | INTRAVENOUS | Status: DC | PRN
  Filled 2020-09-03: qty 10

## 2020-09-03 MED ORDER — SODIUM CHLORIDE 0.9 % IV SOLN
200.0000 mg | Freq: Once | INTRAVENOUS | Status: AC
  Administered 2020-09-03: 200 mg via INTRAVENOUS
  Filled 2020-09-03: qty 200

## 2020-09-03 MED ORDER — FAMOTIDINE IN NACL 20-0.9 MG/50ML-% IV SOLN: 20.0000 mg | Freq: Once | INTRAVENOUS | Status: DC | PRN

## 2020-09-03 MED ORDER — HEPARIN SOD (PORK) LOCK FLUSH 100 UNIT/ML IV SOLN
250.0000 [IU] | Freq: Once | INTRAVENOUS | Status: DC | PRN
  Filled 2020-09-03: qty 5

## 2020-09-03 MED ORDER — SODIUM CHLORIDE 0.9 % IV SOLN
Freq: Once | INTRAVENOUS | Status: DC
  Filled 2020-09-03: qty 250

## 2020-09-05 ENCOUNTER — Encounter: Payer: Self-pay | Admitting: Hematology and Oncology

## 2020-09-05 ENCOUNTER — Telehealth: Payer: Self-pay | Admitting: Hematology and Oncology

## 2020-09-05 NOTE — Telephone Encounter (Signed)
Scheduled appts per 5/23 sch msg. Pt is aware.

## 2020-09-07 ENCOUNTER — Ambulatory Visit: Payer: Managed Care, Other (non HMO)

## 2020-09-17 ENCOUNTER — Other Ambulatory Visit: Payer: Managed Care, Other (non HMO)

## 2020-09-17 ENCOUNTER — Ambulatory Visit: Payer: Managed Care, Other (non HMO)

## 2020-09-17 ENCOUNTER — Ambulatory Visit: Payer: Managed Care, Other (non HMO) | Admitting: Hematology and Oncology

## 2020-09-25 ENCOUNTER — Telehealth: Payer: Self-pay | Admitting: Hematology and Oncology

## 2020-09-25 NOTE — Telephone Encounter (Signed)
Cancelled appointment per 06/14 sch msg. Left patient message. Will call back to reschedule.

## 2020-09-26 ENCOUNTER — Inpatient Hospital Stay: Payer: Managed Care, Other (non HMO)

## 2020-09-28 ENCOUNTER — Inpatient Hospital Stay: Payer: Managed Care, Other (non HMO) | Attending: Hematology and Oncology

## 2020-09-28 ENCOUNTER — Other Ambulatory Visit: Payer: Self-pay

## 2020-09-28 VITALS — BP 121/84 | HR 70 | Temp 97.6°F | Resp 16

## 2020-09-28 DIAGNOSIS — D72829 Elevated white blood cell count, unspecified: Secondary | ICD-10-CM | POA: Insufficient documentation

## 2020-09-28 DIAGNOSIS — D509 Iron deficiency anemia, unspecified: Secondary | ICD-10-CM | POA: Insufficient documentation

## 2020-09-28 MED ORDER — FAMOTIDINE 20 MG IN NS 100 ML IVPB
20.0000 mg | Freq: Once | INTRAVENOUS | Status: AC
  Administered 2020-09-28: 20 mg via INTRAVENOUS

## 2020-09-28 MED ORDER — DIPHENHYDRAMINE HCL 50 MG/ML IJ SOLN
INTRAMUSCULAR | Status: AC
  Filled 2020-09-28: qty 1

## 2020-09-28 MED ORDER — IRON SUCROSE 20 MG/ML IV SOLN
200.0000 mg | Freq: Once | INTRAVENOUS | Status: AC
  Administered 2020-09-28: 200 mg via INTRAVENOUS
  Filled 2020-09-28: qty 200

## 2020-09-28 MED ORDER — DIPHENHYDRAMINE HCL 50 MG/ML IJ SOLN
25.0000 mg | Freq: Once | INTRAMUSCULAR | Status: AC
Start: 2020-09-28 — End: 2020-09-28
  Administered 2020-09-28: 25 mg via INTRAVENOUS

## 2020-09-28 MED ORDER — SODIUM CHLORIDE 0.9 % IV SOLN
Freq: Once | INTRAVENOUS | Status: AC
  Filled 2020-09-28: qty 250

## 2020-09-28 MED ORDER — FAMOTIDINE 20 MG IN NS 100 ML IVPB
INTRAVENOUS | Status: AC
  Filled 2020-09-28: qty 100

## 2020-09-28 NOTE — Patient Instructions (Signed)

## 2020-10-03 ENCOUNTER — Inpatient Hospital Stay: Payer: Managed Care, Other (non HMO)

## 2020-10-03 ENCOUNTER — Telehealth: Payer: Self-pay | Admitting: Hematology and Oncology

## 2020-10-03 ENCOUNTER — Other Ambulatory Visit: Payer: Self-pay

## 2020-10-03 VITALS — BP 120/75 | HR 69 | Temp 98.6°F | Resp 16

## 2020-10-03 DIAGNOSIS — D509 Iron deficiency anemia, unspecified: Secondary | ICD-10-CM

## 2020-10-03 MED ORDER — SODIUM CHLORIDE 0.9 % IV SOLN
Freq: Once | INTRAVENOUS | Status: AC
  Filled 2020-10-03: qty 250

## 2020-10-03 MED ORDER — DIPHENHYDRAMINE HCL 50 MG/ML IJ SOLN
INTRAMUSCULAR | Status: AC
  Filled 2020-10-03: qty 1

## 2020-10-03 MED ORDER — FAMOTIDINE 20 MG IN NS 100 ML IVPB
20.0000 mg | Freq: Once | INTRAVENOUS | Status: AC
  Administered 2020-10-03: 20 mg via INTRAVENOUS

## 2020-10-03 MED ORDER — FAMOTIDINE 20 MG IN NS 100 ML IVPB
INTRAVENOUS | Status: AC
  Filled 2020-10-03: qty 100

## 2020-10-03 MED ORDER — DIPHENHYDRAMINE HCL 50 MG/ML IJ SOLN
25.0000 mg | Freq: Once | INTRAMUSCULAR | Status: AC
  Administered 2020-10-03: 25 mg via INTRAVENOUS

## 2020-10-03 MED ORDER — IRON SUCROSE 20 MG/ML IV SOLN
200.0000 mg | Freq: Once | INTRAVENOUS | Status: AC
  Administered 2020-10-03: 200 mg via INTRAVENOUS
  Filled 2020-10-03: qty 10

## 2020-10-03 NOTE — Patient Instructions (Signed)

## 2020-10-03 NOTE — Telephone Encounter (Signed)
Scheduled appt per 6/22 sch msg. Called pt, no answer. Left msg with appt date and time.

## 2020-10-04 ENCOUNTER — Telehealth: Payer: Self-pay | Admitting: Hematology and Oncology

## 2020-10-04 NOTE — Telephone Encounter (Signed)
Scheduled appointment per 06/22 sch msg. Patient will receive updated calender.

## 2020-10-05 ENCOUNTER — Inpatient Hospital Stay: Payer: Managed Care, Other (non HMO)

## 2020-10-05 ENCOUNTER — Other Ambulatory Visit: Payer: Self-pay

## 2020-10-05 VITALS — BP 128/95 | HR 78 | Temp 98.0°F | Resp 16

## 2020-10-05 DIAGNOSIS — D509 Iron deficiency anemia, unspecified: Secondary | ICD-10-CM

## 2020-10-05 MED ORDER — DIPHENHYDRAMINE HCL 50 MG/ML IJ SOLN
INTRAMUSCULAR | Status: AC
  Filled 2020-10-05: qty 1

## 2020-10-05 MED ORDER — SODIUM CHLORIDE 0.9 % IV SOLN
Freq: Once | INTRAVENOUS | Status: AC
  Filled 2020-10-05: qty 250

## 2020-10-05 MED ORDER — SODIUM CHLORIDE 0.9 % IV SOLN
200.0000 mg | Freq: Once | INTRAVENOUS | Status: AC
  Administered 2020-10-05: 200 mg via INTRAVENOUS
  Filled 2020-10-05: qty 200

## 2020-10-05 MED ORDER — FAMOTIDINE 20 MG IN NS 100 ML IVPB
INTRAVENOUS | Status: AC
  Filled 2020-10-05: qty 100

## 2020-10-05 MED ORDER — DIPHENHYDRAMINE HCL 50 MG/ML IJ SOLN
25.0000 mg | Freq: Once | INTRAMUSCULAR | Status: AC
  Administered 2020-10-05: 25 mg via INTRAVENOUS

## 2020-10-05 MED ORDER — FAMOTIDINE 20 MG IN NS 100 ML IVPB
20.0000 mg | Freq: Once | INTRAVENOUS | Status: AC
  Administered 2020-10-05: 20 mg via INTRAVENOUS

## 2020-10-05 NOTE — Patient Instructions (Signed)

## 2020-10-10 ENCOUNTER — Inpatient Hospital Stay: Payer: Managed Care, Other (non HMO)

## 2020-10-10 ENCOUNTER — Other Ambulatory Visit: Payer: Self-pay

## 2020-10-10 VITALS — BP 126/76 | HR 71 | Temp 98.2°F | Resp 16

## 2020-10-10 DIAGNOSIS — D509 Iron deficiency anemia, unspecified: Secondary | ICD-10-CM | POA: Diagnosis not present

## 2020-10-10 MED ORDER — SODIUM CHLORIDE 0.9 % IV SOLN
Freq: Once | INTRAVENOUS | Status: AC
Start: 2020-10-10 — End: 2020-10-10
  Filled 2020-10-10: qty 250

## 2020-10-10 MED ORDER — FAMOTIDINE 20 MG IN NS 100 ML IVPB
20.0000 mg | Freq: Once | INTRAVENOUS | Status: AC
  Administered 2020-10-10: 20 mg via INTRAVENOUS

## 2020-10-10 MED ORDER — SODIUM CHLORIDE 0.9 % IV SOLN
200.0000 mg | Freq: Once | INTRAVENOUS | Status: AC
  Administered 2020-10-10: 200 mg via INTRAVENOUS
  Filled 2020-10-10: qty 200

## 2020-10-10 MED ORDER — DIPHENHYDRAMINE HCL 50 MG/ML IJ SOLN
25.0000 mg | Freq: Once | INTRAMUSCULAR | Status: AC
  Administered 2020-10-10: 25 mg via INTRAVENOUS

## 2020-10-10 NOTE — Patient Instructions (Signed)

## 2020-10-10 NOTE — Progress Notes (Signed)
Patient tolerated venofer infusion without issues, declined to wait the 30 minute post observation time. VS stable upon d/c home.

## 2020-11-14 ENCOUNTER — Ambulatory Visit: Payer: Managed Care, Other (non HMO) | Admitting: Hematology and Oncology

## 2020-11-15 ENCOUNTER — Encounter (HOSPITAL_BASED_OUTPATIENT_CLINIC_OR_DEPARTMENT_OTHER): Payer: Self-pay | Admitting: Nurse Practitioner

## 2020-12-03 ENCOUNTER — Inpatient Hospital Stay: Payer: Managed Care, Other (non HMO) | Attending: Hematology and Oncology | Admitting: Hematology and Oncology

## 2020-12-03 ENCOUNTER — Inpatient Hospital Stay: Payer: Managed Care, Other (non HMO)

## 2020-12-03 ENCOUNTER — Encounter: Payer: Self-pay | Admitting: Hematology and Oncology

## 2020-12-03 ENCOUNTER — Other Ambulatory Visit: Payer: Self-pay

## 2020-12-03 VITALS — BP 128/90 | HR 68 | Temp 98.2°F | Resp 18 | Wt 271.3 lb

## 2020-12-03 DIAGNOSIS — M069 Rheumatoid arthritis, unspecified: Secondary | ICD-10-CM | POA: Diagnosis not present

## 2020-12-03 DIAGNOSIS — Z880 Allergy status to penicillin: Secondary | ICD-10-CM | POA: Diagnosis not present

## 2020-12-03 DIAGNOSIS — Z809 Family history of malignant neoplasm, unspecified: Secondary | ICD-10-CM | POA: Diagnosis not present

## 2020-12-03 DIAGNOSIS — Z87440 Personal history of urinary (tract) infections: Secondary | ICD-10-CM | POA: Insufficient documentation

## 2020-12-03 DIAGNOSIS — Z79899 Other long term (current) drug therapy: Secondary | ICD-10-CM | POA: Insufficient documentation

## 2020-12-03 DIAGNOSIS — Z8261 Family history of arthritis: Secondary | ICD-10-CM | POA: Insufficient documentation

## 2020-12-03 DIAGNOSIS — Z9049 Acquired absence of other specified parts of digestive tract: Secondary | ICD-10-CM | POA: Diagnosis not present

## 2020-12-03 DIAGNOSIS — D509 Iron deficiency anemia, unspecified: Secondary | ICD-10-CM

## 2020-12-03 DIAGNOSIS — Z800 Family history of malignant neoplasm of digestive organs: Secondary | ICD-10-CM | POA: Insufficient documentation

## 2020-12-03 LAB — CBC WITH DIFFERENTIAL/PLATELET
Abs Immature Granulocytes: 0.05 10*3/uL (ref 0.00–0.07)
Basophils Absolute: 0 10*3/uL (ref 0.0–0.1)
Basophils Relative: 0 %
Eosinophils Absolute: 0.2 10*3/uL (ref 0.0–0.5)
Eosinophils Relative: 2 %
HCT: 45.2 % (ref 36.0–46.0)
Hemoglobin: 14.4 g/dL (ref 12.0–15.0)
Immature Granulocytes: 1 %
Lymphocytes Relative: 24 %
Lymphs Abs: 2.3 10*3/uL (ref 0.7–4.0)
MCH: 26.9 pg (ref 26.0–34.0)
MCHC: 31.9 g/dL (ref 30.0–36.0)
MCV: 84.5 fL (ref 80.0–100.0)
Monocytes Absolute: 0.9 10*3/uL (ref 0.1–1.0)
Monocytes Relative: 9 %
Neutro Abs: 6.2 10*3/uL (ref 1.7–7.7)
Neutrophils Relative %: 64 %
Platelets: 283 10*3/uL (ref 150–400)
RBC: 5.35 MIL/uL — ABNORMAL HIGH (ref 3.87–5.11)
RDW: 16.8 % — ABNORMAL HIGH (ref 11.5–15.5)
WBC: 9.7 10*3/uL (ref 4.0–10.5)
nRBC: 0 % (ref 0.0–0.2)

## 2020-12-03 LAB — IRON AND TIBC
Iron: 88 ug/dL (ref 41–142)
Saturation Ratios: 23 % (ref 21–57)
TIBC: 379 ug/dL (ref 236–444)
UIBC: 291 ug/dL (ref 120–384)

## 2020-12-03 LAB — FERRITIN: Ferritin: 99 ng/mL (ref 11–307)

## 2020-12-03 NOTE — Progress Notes (Signed)
[facility] NOTE  Patient Care Team: Early, Coralee Pesa, NP as PCP - General (Nurse Practitioner) Gavin Pound, MD as Consulting Physician (Rheumatology) Linda Hedges, DO as Consulting Physician (Obstetrics and Gynecology) Havery Moros, [name], MD as Consulting Physician (Gastroenterology)  CHIEF COMPLAINTS/PURPOSE OF CONSULTATION:  IDA  ASSESSMENT & PLAN:  No problem-specific Assessment & Plan notes found for this encounter.  Orders Placed This Encounter  Procedures   CBC with Differential/Platelet    Standing Status:   Standing    Number of Occurrences:   22    Standing Expiration Date:   12/03/2021   Iron and TIBC    Standing Status:   Future    Number of Occurrences:   1    Standing Expiration Date:   12/03/2021   Ferritin    Standing Status:   Future    Number of Occurrences:   1    Standing Expiration Date:   12/03/2021    This is a very pleasant 35 year old female patient with past medical history significant for rheumatoid arthritis on hydroxychloroquine referred hematology for evaluation and recommendations regarding her severe iron deficiency. She did try oral iron for 6 weeks and now received injectafer. Last iron infusion 6/29. PE unremarkable today She feels so much better, will repeat labs today If IDA resolved, can follow up in 6 months.  2.RA on Hydroxychloroquine,   RTC in 6 months with repeat labs.  Thank you for consulting [place] in the care of this patient please not hesitate to contact us with any questions or concerns.  HISTORY OF PRESENTING ILLNESS:   Valetta Mole 35 y.o. female is here because of IDA.    This is a very pleasant 35 year old female patient who arrived to the appointment by herself for evaluation and recommendations for an iron deficiency anemia.    Interim History  Since last visit, she had injectafer, tolerated it very well. She feels so much better, energy levels are so much better. No more PICA, SOB on  exertion Menstrual cycles are regular. No change in bowel habits or urinary habits. Rest of the pertinent 10 point ROS reviewed and negative.  REVIEW OF SYSTEMS:   Constitutional: Denies fevers, chills or abnormal night sweats Eyes: Denies blurriness of vision, double vision or watery eyes Ears, nose, mouth, throat, and face: Denies mucositis or sore throat Respiratory: Denies cough, dyspnea or wheezes Cardiovascular: Denies palpitation, chest discomfort or lower extremity swelling Gastrointestinal:  Denies nausea, heartburn or change in bowel habits Skin: Denies abnormal skin rashes Lymphatics: Denies new lymphadenopathy or easy bruising Neurological:Denies numbness, tingling or new weaknesses Behavioral/Psych: Mood is stable, no new changes  All other systems were reviewed with the patient and are negative.  MEDICAL HISTORY:  Past Medical History:  Diagnosis Date   Anemia    Anxiety    Arthritis    Depression    Depression    Phreesia 02/25/2020   Frequent headaches    History of chicken pox    Otitis media    had tubes as child, then according to pt adipose tissue used to repair ear drum   Rheumatoid arthritis ([place])    UTI (lower urinary tract infection)     SURGICAL HISTORY: Past Surgical History:  Procedure Laterality Date   APPENDECTOMY  2012   HYMENECTOMY  2000   TONSILECTOMY, ADENOIDECTOMY, BILATERAL MYRINGOTOMY AND TUBES      SOCIAL HISTORY: Social History   Socioeconomic History   Marital status: Single  Spouse name: Not on file   Number of children: 0   Years of education: Not on file   Highest education level: Not on file  Occupational History   Occupation: Teacher, music: [employer]  Tobacco Use   Smoking status: Never   Smokeless tobacco: Never  Vaping Use   Vaping Use: Never used  Substance and Sexual Activity   Alcohol use: Yes    Alcohol/week: 0.0 standard drinks    Comment: occasionally    Drug use: No   Sexual activity:  Not Currently    Birth control/protection: Pill  Other Topics Concern   Not on file  Social History Narrative   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       Social Determinants of Health   Financial Resource Strain: Not on file  Food Insecurity: Not on file  Transportation Needs: Not on file  Physical Activity: Not on file  Stress: Not on file  Social Connections: Not on file  Intimate Partner Violence: Not on file    FAMILY HISTORY: Family History  Problem Relation Age of Onset   Arthritis Paternal Grandmother    Cancer Maternal Grandmother    Cancer Maternal Grandfather    Liver cancer Paternal Grandfather     ALLERGIES:  is allergic to kenalog [triamcinolone acetonide] and amoxicillin.  MEDICATIONS:  Current Outpatient Medications  Medication Sig Dispense Refill   cholecalciferol (VITAMIN D3) 25 MCG (1000 UNIT) tablet Take 1,000 Units by mouth daily.     cyclobenzaprine (FLEXERIL) 10 MG tablet Take 0.5-1 tablets (5-10 mg total) by mouth at bedtime. Caution: can cause drowsiness 30 tablet 1   escitalopram (LEXAPRO) 20 MG tablet Take 1 tablet (20 mg total) by mouth at bedtime. 90 tablet 3   HUMIRA PEN 40 MG/0.4ML PNKT SMARTSIG:40 Milligram(s) SUB-Q Every 2 Weeks     hydroxychloroquine (PLAQUENIL) 200 MG tablet Take 1 tablet (200 mg total) by mouth 2 (two) times daily. 60 tablet 0   No current facility-administered medications for this visit.     PHYSICAL EXAMINATION: ECOG PERFORMANCE STATUS: 1 - Symptomatic but completely ambulatory  Vitals:   12/03/20 0855  BP: 128/90  Pulse: 68  Resp: 18  Temp: 98.2 F (36.8 C)  SpO2: 97%    Filed Weights   12/03/20 0855  Weight: 271 lb 5 oz (123.1 kg)    GENERAL:alert, no distress and comfortable, obese. SKIN: skin color, texture, turgor are normal, no rashes or significant  lesions EYES: normal, mild pallor, sclera clear OROPHARYNX:no exudate, no erythema and lips, buccal mucosa, and tongue normal  NECK: supple, thyroid normal size, non-tender, without nodularity LYMPH:  no palpable lymphadenopathy in the cervical, axillary or inguinal LUNGS: clear to auscultation and percussion with normal breathing effort HEART: regular rate & rhythm and no murmurs and no lower extremity edema ABDOMEN:abdomen soft, non-tender and normal bowel sounds Musculoskeletal:no cyanosis of digits and no clubbing  PSYCH: alert & oriented x 3 with fluent speech NEURO: no focal motor/sensory deficits  LABORATORY DATA:  I have reviewed the data as listed Lab Results  Component Value Date   WBC 6.8 09/03/2020   HGB 11.7 (L) 09/03/2020   HCT 40.1 09/03/2020   MCV 75.7 (L) 09/03/2020   PLT 315 09/03/2020     Chemistry      Component Value Date/Time  NA 138 08/15/2020 1003   K 5.0 08/15/2020 1003   CL 101 08/15/2020 1003   CO2 23 08/15/2020 1003   BUN 10 08/15/2020 1003   CREATININE 0.90 08/15/2020 1003   CREATININE 0.80 04/29/2012 1724      Component Value Date/Time   CALCIUM 9.5 08/15/2020 1003   ALKPHOS 95 08/15/2020 1003   AST 25 08/15/2020 1003   ALT 33 (H) 08/15/2020 1003   BILITOT <0.2 08/15/2020 1003      Labs from today are pending.  RADIOGRAPHIC STUDIES: I have personally reviewed the radiological images as listed and agreed with the findings in the report. No results found.  All questions were answered. The patient knows to call the clinic with any problems, questions or concerns. I spent a total of 20 minutes in the care of this patient including history and physical, review of records, counseling and coordination of care. Reviewed symptoms and signs of IDA, surveillance recommendations.    Benay Pike, MD 12/03/2020 9:21 AM

## 2021-01-07 ENCOUNTER — Other Ambulatory Visit: Payer: Self-pay | Admitting: Rheumatology

## 2021-01-07 DIAGNOSIS — R748 Abnormal levels of other serum enzymes: Secondary | ICD-10-CM

## 2021-01-14 ENCOUNTER — Encounter: Payer: Self-pay | Admitting: Hematology and Oncology

## 2021-01-14 ENCOUNTER — Ambulatory Visit
Admission: RE | Admit: 2021-01-14 | Discharge: 2021-01-14 | Disposition: A | Discharge status: Home / Self Care | Payer: Managed Care, Other (non HMO) | Source: Ambulatory Visit | Attending: Rheumatology | Admitting: Rheumatology

## 2021-01-14 DIAGNOSIS — R748 Abnormal levels of other serum enzymes: Secondary | ICD-10-CM

## 2021-02-05 ENCOUNTER — Ambulatory Visit (HOSPITAL_BASED_OUTPATIENT_CLINIC_OR_DEPARTMENT_OTHER): Payer: Managed Care, Other (non HMO) | Admitting: Nurse Practitioner

## 2021-02-05 ENCOUNTER — Other Ambulatory Visit: Payer: Self-pay

## 2021-02-05 ENCOUNTER — Encounter (HOSPITAL_BASED_OUTPATIENT_CLINIC_OR_DEPARTMENT_OTHER): Payer: Self-pay | Admitting: Nurse Practitioner

## 2021-02-05 VITALS — BP 108/84 | HR 73 | Ht 66.0 in | Wt 269.4 lb

## 2021-02-05 DIAGNOSIS — E782 Mixed hyperlipidemia: Secondary | ICD-10-CM | POA: Insufficient documentation

## 2021-02-05 DIAGNOSIS — K760 Fatty (change of) liver, not elsewhere classified: Secondary | ICD-10-CM

## 2021-02-05 DIAGNOSIS — R7303 Prediabetes: Secondary | ICD-10-CM

## 2021-02-05 DIAGNOSIS — Z6841 Body Mass Index (BMI) 40.0 and over, adult: Secondary | ICD-10-CM | POA: Insufficient documentation

## 2021-02-05 MED ORDER — SEMAGLUTIDE (1 MG/DOSE) 4 MG/3ML ~~LOC~~ SOPN: 1.0000 mg | PEN_INJECTOR | SUBCUTANEOUS | 3 refills | Status: DC

## 2021-02-05 MED ORDER — OZEMPIC (0.25 OR 0.5 MG/DOSE) 2 MG/1.5ML ~~LOC~~ SOPN: PEN_INJECTOR | SUBCUTANEOUS | 0 refills | Status: DC

## 2021-02-05 MED ORDER — SEMAGLUTIDE(0.25 OR 0.5MG/DOS) 2 MG/1.5ML ~~LOC~~ SOPN: 0.5000 mg | PEN_INJECTOR | SUBCUTANEOUS | 0 refills | Status: DC

## 2021-02-05 NOTE — Assessment & Plan Note (Signed)
Pre-diabetes detected on labs earlier this year with new diagnosis of fatty liver.  Due to BMI, hepatic steatosis, HLD, and pre-DM aggressive treatment options recommended to help reduce body mass and comorbid conditions.  GLP-1 medication would be an excellent option for her due to the excellent weight loss and blood sugar profile.  Discussed mechanism of action, warnings, risks, and side effects with patient.  Avoid large meals and foods high in fat and carbohydrates.  Walk 20 minutes every day.  Will likely need PA for medication, but due to RF, this should be approved.  F/U in 3 months for repeat labs and evaluation

## 2021-02-05 NOTE — Assessment & Plan Note (Signed)
[place] encounter and results reviewed.  Hepatic steatosis present with possible underlying inflammation.  Given family history and her current RF, weight management is ideal to help reduce BMI and risks associated with fatty liver.  Discussion of diet, exercise, and medication management for weight control.  Avoid alcohol intake of more than 3 per week and avoid use of tylenl and other medications that may be hard on the liver.  We will recheck labs in 3 months to see if liver enzymes have gone down.

## 2021-02-05 NOTE — Assessment & Plan Note (Signed)
BMI 43.48 today with new diagnosis of hepatic steatosis.  Discussion on treatment options including diet, exercise, and medications provided today.  Recommend GLP-1 medication to help reduce weight and associated conditions.  Will plan to f/u in 3 months with repeat labs and weight check .

## 2021-02-05 NOTE — Progress Notes (Signed)
Established Patient Office Visit  Subjective:  Patient ID: Laura [name], female    DOB: [birth year]  Age: 35 y.o. MRN: [id number]  CC:  Chief Complaint  Patient presents with   Fatty Liver    Recent dx on Liver US and wants to discuss as well as initiation of GLP-1 medication for weight loss    HPI Laura [name] presents for follow-up from US performed by rheumatology.  Liver enzyes were elevated at her last visit with Rheum and [place] was ordered for evaluation. Results show fatty liver.  Patient has a strong family history of fatty liver in her father and paternal GF. She reports GF developed hepatic cancer near the end of his life and this recent finding alarmed her to be proactive about management  She denies abdominal pain, yellowing of the skin or eyes, dark urine.  She is taking Humira for RA.  She is interested in GLP-1 medications for weight loss to help with fatty liver, cholesterol, and weight.    ROS Review of Systems All review of systems negative except what is listed in the HPI    Objective:    Physical Exam Vitals and nursing note reviewed.  Constitutional:      General: She is not in acute distress.    Appearance: Normal appearance. She is obese.  HENT:     Head: Normocephalic.  Eyes:     General: No scleral icterus.    Extraocular Movements: Extraocular movements intact.     Conjunctiva/sclera: Conjunctivae normal.     Pupils: Pupils are equal, round, and reactive to light.  Cardiovascular:     Rate and Rhythm: Normal rate and regular rhythm.     Pulses: Normal pulses.  Pulmonary:     Effort: Pulmonary effort is normal.  Abdominal:     General: Abdomen is flat. Bowel sounds are normal. There is no distension.     Palpations: Abdomen is soft. There is no mass.     Tenderness: There is no abdominal tenderness. There is no guarding or rebound.     Hernia: No hernia is present.  Musculoskeletal:        General: Normal range of motion.      Cervical back: Normal range of motion.     Right lower leg: No edema.     Left lower leg: No edema.  Skin:    General: Skin is warm and dry.     Capillary Refill: Capillary refill takes less than 2 seconds.  Neurological:     General: No focal deficit present.     Mental Status: She is alert and oriented to person, place, and time.  Psychiatric:        Mood and Affect: Mood normal.        Behavior: Behavior normal.        Thought Content: Thought content normal.        Judgment: Judgment normal.    BP 108/84   Pulse 73   Ht _0  (1.676 m)   Wt 269 lb 6.4 oz (122.2 kg)   SpO2 95%   BMI 43.48 kg/m  Wt Readings from Last 3 Encounters:  02/05/21 269 lb 6.4 oz (122.2 kg)  12/03/20 271 lb 5 oz (123.1 kg)  07/30/20 265 lb 6.4 oz (120.4 kg)   There are no preventive care reminders to display for this patient.   Lab Results  Component Value Date   WBC 9.7 12/03/2020   HGB 14.4 12/03/2020  HCT 45.2 12/03/2020   MCV 84.5 12/03/2020   PLT 283 12/03/2020   Lab Results  Component Value Date   NA 138 08/15/2020   K 5.0 08/15/2020   CO2 23 08/15/2020   GLUCOSE 90 08/15/2020   BUN 10 08/15/2020   CREATININE 0.90 08/15/2020   BILITOT <0.2 08/15/2020   ALKPHOS 95 08/15/2020   AST 25 08/15/2020   ALT 33 (H) 08/15/2020   PROT 7.1 08/15/2020   ALBUMIN 4.1 08/15/2020   CALCIUM 9.5 08/15/2020   ANIONGAP 11 09/12/2017   EGFR 85 08/15/2020   Lab Results  Component Value Date   CHOL 167 05/12/2017   Lab Results  Component Value Date   HDL 31 (L) 05/12/2017   Lab Results  Component Value Date   LDLCALC 113 (H) 05/12/2017   Lab Results  Component Value Date   TRIG 113 05/12/2017   Lab Results  Component Value Date   CHOLHDL 5.4 (H) 05/12/2017   Lab Results  Component Value Date   HGBA1C 5.9 (H) 01/13/2020      Assessment & Plan:   Problem List Items Addressed This Visit     Prediabetes    Pre-diabetes detected on labs earlier this year with new diagnosis  of fatty liver.  Due to BMI, hepatic steatosis, HLD, and pre-DM aggressive treatment options recommended to help reduce body mass and comorbid conditions.  GLP-1 medication would be an excellent option for her due to the excellent weight loss and blood sugar profile.  Discussed mechanism of action, warnings, risks, and side effects with patient.  Avoid large meals and foods high in fat and carbohydrates.  Walk 20 minutes every day.  Will likely need PA for medication, but due to RF, this should be approved.  F/U in 3 months for repeat labs and evaluation      Relevant Medications   Semaglutide,0.25 or 0.5MG /DOS, (OZEMPIC, 0.25 OR 0.5 MG/DOSE,) 2 MG/1.5ML SOPN   Semaglutide,0.25 or 0.5MG /DOS, 2 MG/1.5ML SOPN   Semaglutide, 1 MG/DOSE, 4 MG/3ML SOPN   Hepatic steatosis - Primary    [place] encounter and results reviewed.  Hepatic steatosis present with possible underlying inflammation.  Given family history and her current RF, weight management is ideal to help reduce BMI and risks associated with fatty liver.  Discussion of diet, exercise, and medication management for weight control.  Avoid alcohol intake of more than 3 per week and avoid use of tylenl and other medications that may be hard on the liver.  We will recheck labs in 3 months to see if liver enzymes have gone down.        Relevant Medications   Semaglutide,0.25 or 0.5MG /DOS, (OZEMPIC, 0.25 OR 0.5 MG/DOSE,) 2 MG/1.5ML SOPN   Semaglutide,0.25 or 0.5MG /DOS, 2 MG/1.5ML SOPN   Semaglutide, 1 MG/DOSE, 4 MG/3ML SOPN   BMI 40.0-44.9, adult (HCC)    BMI 43.48 today with new diagnosis of hepatic steatosis.  Discussion on treatment options including diet, exercise, and medications provided today.  Recommend GLP-1 medication to help reduce weight and associated conditions.  Will plan to f/u in 3 months with repeat labs and weight check .      Relevant Medications   Semaglutide,0.25 or 0.5MG /DOS, (OZEMPIC, 0.25 OR 0.5 MG/DOSE,) 2  MG/1.5ML SOPN   Semaglutide,0.25 or 0.5MG /DOS, 2 MG/1.5ML SOPN   Semaglutide, 1 MG/DOSE, 4 MG/3ML SOPN   Moderate mixed hyperlipidemia not requiring statin therapy    Elevated lipids in the setting of pre-DM, obesity, and hepatic steatosis.  No  statin therapy at this time We will start treatment today with GLP-1 and see if we can get her levels down without any additional medications that can be hard on the liver.  Will f/u with labs in 3 months.       Relevant Medications   Semaglutide,0.25 or 0.5MG/DOS, (OZEMPIC, 0.25 OR 0.5 MG/DOSE,) 2 MG/1.5ML SOPN   Semaglutide,0.25 or 0.5MG/DOS, 2 MG/1.5ML SOPN   Semaglutide, 1 MG/DOSE, 4 MG/3ML SOPN    Meds ordered this encounter  Medications   Semaglutide,0.25 or 0.5MG/DOS, (OZEMPIC, 0.25 OR 0.5 MG/DOSE,) 2 MG/1.5ML SOPN    Sig: Inject 0.25 mg into the skin every 7 (seven) days for 28 days, THEN 0.5 mg every 7 (seven) days for 14 days. Starter Dose.Marland Kitchen    Dispense:  1.5 mL    Refill:  0   Semaglutide,0.25 or 0.5MG/DOS, 2 MG/1.5ML SOPN    Sig: Inject 0.5 mg into the skin every 7 (seven) days. Second Dose.    Dispense:  1.5 mL    Refill:  0   Semaglutide, 1 MG/DOSE, 4 MG/3ML SOPN    Sig: Inject 1 mg as directed once a week. Maintenance Dose.    Dispense:  9 mL    Refill:  3     Follow-up: Return in about 3 months (around 05/08/2021) for Liver F/U.    Orma Render, NPSaxenda vs. Ozempic

## 2021-02-05 NOTE — Patient Instructions (Signed)
I will see what medication is covered best by your insurance and get that sent in for you.   We will plan to follow-up about 3 months after you start to see how you are doing and if you have any changes.

## 2021-02-05 NOTE — Assessment & Plan Note (Signed)
Elevated lipids in the setting of pre-DM, obesity, and hepatic steatosis.  No statin therapy at this time We will start treatment today with GLP-1 and see if we can get her levels down without any additional medications that can be hard on the liver.  Will f/u with labs in 3 months.

## 2021-02-12 ENCOUNTER — Encounter (HOSPITAL_BASED_OUTPATIENT_CLINIC_OR_DEPARTMENT_OTHER): Payer: Self-pay | Admitting: Nurse Practitioner

## 2021-02-21 ENCOUNTER — Other Ambulatory Visit (HOSPITAL_BASED_OUTPATIENT_CLINIC_OR_DEPARTMENT_OTHER): Payer: Self-pay | Admitting: Nurse Practitioner

## 2021-02-21 DIAGNOSIS — E782 Mixed hyperlipidemia: Secondary | ICD-10-CM

## 2021-02-21 DIAGNOSIS — K760 Fatty (change of) liver, not elsewhere classified: Secondary | ICD-10-CM

## 2021-02-21 DIAGNOSIS — Z6841 Body Mass Index (BMI) 40.0 and over, adult: Secondary | ICD-10-CM

## 2021-02-21 DIAGNOSIS — R7303 Prediabetes: Secondary | ICD-10-CM

## 2021-02-21 MED ORDER — TRULICITY 0.75 MG/0.5ML ~~LOC~~ SOAJ: 0.7500 mg | SUBCUTANEOUS | 0 refills | Status: DC

## 2021-02-21 MED ORDER — TRULICITY 1.5 MG/0.5ML ~~LOC~~ SOAJ: 1.5000 mg | SUBCUTANEOUS | 3 refills | Status: DC

## 2021-04-17 ENCOUNTER — Encounter (HOSPITAL_BASED_OUTPATIENT_CLINIC_OR_DEPARTMENT_OTHER): Payer: Self-pay | Admitting: Nurse Practitioner

## 2021-04-17 DIAGNOSIS — E282 Polycystic ovarian syndrome: Secondary | ICD-10-CM

## 2021-04-17 DIAGNOSIS — Z6841 Body Mass Index (BMI) 40.0 and over, adult: Secondary | ICD-10-CM

## 2021-04-17 DIAGNOSIS — R7303 Prediabetes: Secondary | ICD-10-CM

## 2021-04-17 MED ORDER — SEMAGLUTIDE-WEIGHT MANAGEMENT 0.25 MG/0.5ML ~~LOC~~ SOAJ: 0.2500 mg | SUBCUTANEOUS | 0 refills | Status: DC

## 2021-04-17 NOTE — Addendum Note (Signed)
Addended by: Campbell Riches on: 04/17/2021 03:53 PM   Modules accepted: Orders

## 2021-05-08 ENCOUNTER — Encounter (HOSPITAL_BASED_OUTPATIENT_CLINIC_OR_DEPARTMENT_OTHER): Payer: Self-pay | Admitting: Nurse Practitioner

## 2021-05-08 ENCOUNTER — Other Ambulatory Visit: Payer: Self-pay

## 2021-05-08 ENCOUNTER — Ambulatory Visit (HOSPITAL_BASED_OUTPATIENT_CLINIC_OR_DEPARTMENT_OTHER): Payer: Managed Care, Other (non HMO) | Admitting: Nurse Practitioner

## 2021-05-08 VITALS — BP 112/88 | HR 79 | Ht 66.0 in | Wt 267.4 lb

## 2021-05-08 DIAGNOSIS — Z6841 Body Mass Index (BMI) 40.0 and over, adult: Secondary | ICD-10-CM

## 2021-05-08 DIAGNOSIS — Z23 Encounter for immunization: Secondary | ICD-10-CM

## 2021-05-08 DIAGNOSIS — R748 Abnormal levels of other serum enzymes: Secondary | ICD-10-CM | POA: Insufficient documentation

## 2021-05-08 DIAGNOSIS — E282 Polycystic ovarian syndrome: Secondary | ICD-10-CM | POA: Diagnosis not present

## 2021-05-08 DIAGNOSIS — K760 Fatty (change of) liver, not elsewhere classified: Secondary | ICD-10-CM

## 2021-05-08 DIAGNOSIS — R7303 Prediabetes: Secondary | ICD-10-CM | POA: Diagnosis not present

## 2021-05-08 DIAGNOSIS — E559 Vitamin D deficiency, unspecified: Secondary | ICD-10-CM

## 2021-05-08 DIAGNOSIS — E782 Mixed hyperlipidemia: Secondary | ICD-10-CM

## 2021-05-08 MED ORDER — VITAMIN D (ERGOCALCIFEROL) 1.25 MG (50000 UNIT) PO CAPS: 50000.0000 [IU] | ORAL_CAPSULE | ORAL | 0 refills | Status: DC

## 2021-05-08 MED ORDER — ONDANSETRON HCL 8 MG PO TABS: 8.0000 mg | ORAL_TABLET | Freq: Three times a day (TID) | ORAL | 2 refills | Status: DC | PRN

## 2021-05-08 MED ORDER — SEMAGLUTIDE-WEIGHT MANAGEMENT 1 MG/0.5ML ~~LOC~~ SOAJ: 1.0000 mg | SUBCUTANEOUS | 2 refills | Status: DC

## 2021-05-08 MED ORDER — SEMAGLUTIDE-WEIGHT MANAGEMENT 0.5 MG/0.5ML ~~LOC~~ SOAJ: 0.5000 mg | SUBCUTANEOUS | 0 refills | Status: DC

## 2021-05-08 NOTE — Assessment & Plan Note (Signed)
Will plan to monitor labs at her next visit.  Currently working on weight loss, diet, and activity.

## 2021-05-08 NOTE — Assessment & Plan Note (Addendum)
Increase dose of semaglutide to 0.5mg  weekly with taper to 1mg  in one month.  Will plan to recheck labs in 3-4 months once she has been on medication a little longer.  Diet and activity recommendations provided.

## 2021-05-08 NOTE — Assessment & Plan Note (Signed)
Increase semaglutide to 0.5mg  and further increase to 1mg  in one month.  Will plan to recheck labs in the next 3-4 months for evaluation.  She has had weight loss this far and is tolerating the medication well.

## 2021-05-08 NOTE — Assessment & Plan Note (Signed)
Working on weight loss with use of semaglutide, diet, and increased activity levels.  She is responding well to medication thus far.  Will increase dosage and continue to monitor with plans to taper up to 1mg  dose after 28 days on 0.5mg .  Will plan to follow-ups with labs in the next 3-4 months with f/u visit.

## 2021-05-08 NOTE — Patient Instructions (Signed)
Vitamin D3 once a week for 8 weeks. We can always consider keeping you on this.   We will plan on doing labs in 3-4 months with follow-up.

## 2021-05-08 NOTE — Progress Notes (Signed)
Established Patient Office Visit  Subjective:  Patient ID: Laura [name], female    DOB: [birth year]  Age: 36 y.o. MRN: [id number]  CC:  Chief Complaint  Patient presents with   Follow-up    Patient presents today for 3 mo follow up. Patient has no acute issues or concerns today. Patient is tolerating Wegovy well and would like to go up to the 0.5 mg dose.    HPI Laura [name] presents for follow-up for weight management. She has been taking semaglutide 0.25mg  for about 1 month at this time and has been doing well on the dose. She endorses some intermittent nausea and 1 episode of vomiting that occurred after eating a high fat/high carb meal. Otherwise she is tolerating well and tells me that she has noticed a decrease in hunger and cravings between meals. She would like to increase the dosage.   She mentions increased bleeding associated with her most recent menstrual period. She also feels that her iron levels may have dropped. She is seeing hematology in the next week and plans to have these checked.   Past Medical History:  Diagnosis Date   Anemia    Anxiety    Arthritis    Depression    Depression    Phreesia 02/25/2020   Frequent headaches    History of chicken pox    Otitis media    had tubes as child, then according to pt adipose tissue used to repair ear drum   Rheumatoid arthritis ([place])    UTI (lower urinary tract infection)     Past Surgical History:  Procedure Laterality Date   APPENDECTOMY  2012   HYMENECTOMY  2000   TONSILECTOMY, ADENOIDECTOMY, BILATERAL MYRINGOTOMY AND TUBES      Family History  Problem Relation Age of Onset   Arthritis Paternal Grandmother    Cancer Maternal Grandmother    Cancer Maternal Grandfather    Liver cancer Paternal Grandfather     Social History   Socioeconomic History   Marital status: Single    Spouse name: Not on file   Number of children: 0   Years of education: Not on file   Highest education level:  Not on file  Occupational History   Occupation: Teacher, music: [employer]  Tobacco Use   Smoking status: Never   Smokeless tobacco: Never  Vaping Use   Vaping Use: Never used  Substance and Sexual Activity   Alcohol use: Yes    Alcohol/week: 0.0 standard drinks    Comment: occasionally    Drug use: No   Sexual activity: Not Currently    Birth control/protection: Pill  Other Topics Concern   Not on file  Social History Narrative   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       Social Determinants of Health   Financial Resource Strain: Not on file  Food Insecurity: Not on file  Transportation Needs: Not on file  Physical Activity: Not on file  Stress: Not on file  Social Connections: Not on file  Intimate Partner Violence: Not on file    Outpatient Medications Prior to Visit  Medication Sig Dispense Refill   cholecalciferol (VITAMIN D3) 25 MCG (1000 UNIT) tablet Take 1,000 Units by mouth daily.     cyclobenzaprine (FLEXERIL) 10 MG tablet Take 0.5-1 tablets (5-10  mg total) by mouth at bedtime. Caution: can cause drowsiness 30 tablet 1   escitalopram (LEXAPRO) 20 MG tablet Take 1 tablet (20 mg total) by mouth at bedtime. 90 tablet 3   HUMIRA PEN 40 MG/0.4ML PNKT SMARTSIG:40 Milligram(s) SUB-Q Every 2 Weeks     hydroxychloroquine (PLAQUENIL) 200 MG tablet Take 1 tablet (200 mg total) by mouth 2 (two) times daily. 60 tablet 0   Semaglutide-Weight Management 0.25 MG/0.5ML SOAJ Inject 0.25 mg into the skin once a week. 3 mL 0   Dulaglutide (TRULICITY) 5.63 OV/5.6EP SOPN Inject 0.75 mg into the skin once a week. (Patient not taking: Reported on 05/08/2021) 2 mL 0   Dulaglutide (TRULICITY) 1.5 PI/9.5JO SOPN Inject 1.5 mg into the skin once a week. Start after initial 0.$RemoveBeforeD'75mg'uxgAYqrqrtPmSk$  dosing complete (Patient not taking: Reported on  05/08/2021) 2 mL 3   No facility-administered medications prior to visit.    Allergies  Allergen Reactions   Kenalog [Triamcinolone Acetonide]     Rash noted after ankle injection by rheumatologist   Amoxicillin Rash    ROS Review of Systems All review of systems negative except what is listed in the HPI    Objective:    Physical Exam Vitals and nursing note reviewed.  Constitutional:      Appearance: Normal appearance.  HENT:     Head: Normocephalic.  Eyes:     Extraocular Movements: Extraocular movements intact.     Conjunctiva/sclera: Conjunctivae normal.     Pupils: Pupils are equal, round, and reactive to light.  Neck:     Vascular: No carotid bruit.  Cardiovascular:     Rate and Rhythm: Normal rate and regular rhythm.     Pulses: Normal pulses.     Heart sounds: Normal heart sounds.  Pulmonary:     Effort: Pulmonary effort is normal.     Breath sounds: Normal breath sounds.  Musculoskeletal:     Cervical back: Normal range of motion.     Right lower leg: No edema.     Left lower leg: No edema.  Skin:    General: Skin is warm and dry.     Capillary Refill: Capillary refill takes less than 2 seconds.  Neurological:     General: No focal deficit present.     Mental Status: She is alert and oriented to person, place, and time.  Psychiatric:        Mood and Affect: Mood normal.        Behavior: Behavior normal.        Thought Content: Thought content normal.        Judgment: Judgment normal.    BP 112/88    Pulse 79    Ht $R'5\' 6"'yF$  (1.676 m)    Wt 267 lb 6.4 oz (121.3 kg)    SpO2 98%    BMI 43.16 kg/m  Wt Readings from Last 3 Encounters:  05/08/21 267 lb 6.4 oz (121.3 kg)  02/05/21 269 lb 6.4 oz (122.2 kg)  12/03/20 271 lb 5 oz (123.1 kg)     Health Maintenance Due  Topic Date Due   COLONOSCOPY (Pts 45-29yrs Insurance coverage will need to be confirmed)  06/26/2018   PAP SMEAR-Modifier  04/15/2019   COVID-19 Vaccine (4 - Booster for [place] series)  04/18/2020    There are no preventive care reminders to display for this patient.  Lab Results  Component Value Date   TSH 1.700 08/15/2020   Lab Results  Component Value Date   WBC  9.7 12/03/2020   HGB 14.4 12/03/2020   HCT 45.2 12/03/2020   MCV 84.5 12/03/2020   PLT 283 12/03/2020   Lab Results  Component Value Date   NA 138 08/15/2020   K 5.0 08/15/2020   CO2 23 08/15/2020   GLUCOSE 90 08/15/2020   BUN 10 08/15/2020   CREATININE 0.90 08/15/2020   BILITOT <0.2 08/15/2020   ALKPHOS 95 08/15/2020   AST 25 08/15/2020   ALT 33 (H) 08/15/2020   PROT 7.1 08/15/2020   ALBUMIN 4.1 08/15/2020   CALCIUM 9.5 08/15/2020   ANIONGAP 11 09/12/2017   EGFR 85 08/15/2020   Lab Results  Component Value Date   CHOL 167 05/12/2017   Lab Results  Component Value Date   HDL 31 (L) 05/12/2017   Lab Results  Component Value Date   LDLCALC 113 (H) 05/12/2017   Lab Results  Component Value Date   TRIG 113 05/12/2017   Lab Results  Component Value Date   CHOLHDL 5.4 (H) 05/12/2017   Lab Results  Component Value Date   HGBA1C 5.9 (H) 01/13/2020      Assessment & Plan:   Problem List Items Addressed This Visit     Prediabetes    Increase dose of semaglutide to 0.$RemoveBeforeDE'5mg'yLIWuhHaPDZixsT$  weekly with taper to $Remove'1mg'TSjacpC$  in one month.  Will plan to recheck labs in 3-4 months once she has been on medication a little longer.  Diet and activity recommendations provided.       Relevant Medications   ondansetron (ZOFRAN) 8 MG tablet   Semaglutide-Weight Management 0.5 MG/0.5ML SOAJ (Start on 06/06/2021)   Semaglutide-Weight Management 1 MG/0.5ML SOAJ (Start on 07/05/2021)   Vitamin D deficiency    History of vitamin D deficiency without recent replacement.  Will send 8 week replacement at this time and plan to recheck her labs at her next visit to evaluate if prolonged treatment is needed. Will monitor closely as this has been a problem in the past.       Relevant Medications   Vitamin D,  Ergocalciferol, (DRISDOL) 1.25 MG (50000 UNIT) CAPS capsule   Polycystic ovary syndrome    Increased menstrual bleeding and pain associated with most recent menstrual cycle.  Patient is not currently on hormonal management at this time and has not had recent evaluation with OB GYN. I do recommend that she speak with GYN for recommendations and evaluation given the most recent symptoms experienced. She may likely benefit from additional management for improved menses and improvement of her anemia symptoms. If she is unable to be seen by GYN, I will be happy to order ultrasound for evaluation and begin OCP therapy for management if she wishes. She will let me know.       Relevant Medications   ondansetron (ZOFRAN) 8 MG tablet   Semaglutide-Weight Management 0.5 MG/0.5ML SOAJ (Start on 06/06/2021)   Semaglutide-Weight Management 1 MG/0.5ML SOAJ (Start on 07/05/2021)   Hepatic steatosis - Primary    Working on weight loss with use of semaglutide, diet, and increased activity levels.  She is responding well to medication thus far.  Will increase dosage and continue to monitor with plans to taper up to $Rem'1mg'MsxR$  dose after 28 days on 0.$RemoveB'5mg'lVLwpIFI$ .  Will plan to follow-ups with labs in the next 3-4 months with f/u visit.       Relevant Medications   ondansetron (ZOFRAN) 8 MG tablet   Semaglutide-Weight Management 0.5 MG/0.5ML SOAJ (Start on 06/06/2021)   Semaglutide-Weight Management 1 MG/0.5ML [employer] (Start  on 07/05/2021)   BMI 40.0-44.9, adult (HCC)    Increase semaglutide to 0.$RemoveBeforeDE'5mg'rsGaGTWjYJTDzLo$  and further increase to $RemoveBef'1mg'PnvIStsERy$  in one month.  Will plan to recheck labs in the next 3-4 months for evaluation.  She has had weight loss this far and is tolerating the medication well.        Relevant Medications   ondansetron (ZOFRAN) 8 MG tablet   Semaglutide-Weight Management 0.5 MG/0.5ML SOAJ (Start on 06/06/2021)   Semaglutide-Weight Management 1 MG/0.5ML SOAJ (Start on 07/05/2021)   Moderate mixed hyperlipidemia not requiring  statin therapy    Will plan to monitor labs at her next visit.  Currently working on weight loss, diet, and activity.       Relevant Medications   ondansetron (ZOFRAN) 8 MG tablet   Semaglutide-Weight Management 0.5 MG/0.5ML SOAJ (Start on 06/06/2021)   Semaglutide-Weight Management 1 MG/0.5ML SOAJ (Start on 07/05/2021)   Other Visit Diagnoses     Encounter for immunization       Relevant Orders   Flu Vaccine QUAD 6+ mos PF IM (Fluarix Quad PF) (Completed)       Meds ordered this encounter  Medications   ondansetron (ZOFRAN) 8 MG tablet    Sig: Take 1 tablet (8 mg total) by mouth every 8 (eight) hours as needed for nausea.    Dispense:  30 tablet    Refill:  2   Semaglutide-Weight Management 0.5 MG/0.5ML SOAJ    Sig: Inject 0.5 mg into the skin once a week for 28 days.    Dispense:  2 mL    Refill:  0   Semaglutide-Weight Management 1 MG/0.5ML SOAJ    Sig: Inject 1 mg into the skin once a week for 28 days.    Dispense:  2 mL    Refill:  2   Vitamin D, Ergocalciferol, (DRISDOL) 1.25 MG (50000 UNIT) CAPS capsule    Sig: Take 1 capsule (50,000 Units total) by mouth every 7 (seven) days. Take for 8 total doses(weeks)    Dispense:  8 capsule    Refill:  0    Follow-up: Return for 3-4 months weight and labs.    Orma Render, NP

## 2021-05-08 NOTE — Assessment & Plan Note (Signed)
Increased menstrual bleeding and pain associated with most recent menstrual cycle.  Patient is not currently on hormonal management at this time and has not had recent evaluation with OB GYN. I do recommend that she speak with GYN for recommendations and evaluation given the most recent symptoms experienced. She may likely benefit from additional management for improved menses and improvement of her anemia symptoms. If she is unable to be seen by GYN, I will be happy to order ultrasound for evaluation and begin OCP therapy for management if she wishes. She will let me know.

## 2021-05-08 NOTE — Assessment & Plan Note (Signed)
History of vitamin D deficiency without recent replacement.  Will send 8 week replacement at this time and plan to recheck her labs at her next visit to evaluate if prolonged treatment is needed. Will monitor closely as this has been a problem in the past.

## 2021-06-04 ENCOUNTER — Encounter: Payer: Self-pay | Admitting: Hematology and Oncology

## 2021-06-04 ENCOUNTER — Inpatient Hospital Stay: Payer: Managed Care, Other (non HMO)

## 2021-06-04 ENCOUNTER — Inpatient Hospital Stay: Payer: Managed Care, Other (non HMO) | Attending: Hematology and Oncology | Admitting: Hematology and Oncology

## 2021-06-04 ENCOUNTER — Other Ambulatory Visit: Payer: Self-pay

## 2021-06-04 VITALS — BP 123/87 | HR 87 | Temp 97.9°F | Resp 16 | Ht 66.0 in | Wt 263.0 lb

## 2021-06-04 DIAGNOSIS — D509 Iron deficiency anemia, unspecified: Secondary | ICD-10-CM | POA: Diagnosis not present

## 2021-06-04 DIAGNOSIS — Z809 Family history of malignant neoplasm, unspecified: Secondary | ICD-10-CM | POA: Diagnosis not present

## 2021-06-04 DIAGNOSIS — M069 Rheumatoid arthritis, unspecified: Secondary | ICD-10-CM | POA: Diagnosis not present

## 2021-06-04 DIAGNOSIS — Z880 Allergy status to penicillin: Secondary | ICD-10-CM | POA: Insufficient documentation

## 2021-06-04 DIAGNOSIS — Z800 Family history of malignant neoplasm of digestive organs: Secondary | ICD-10-CM | POA: Insufficient documentation

## 2021-06-04 DIAGNOSIS — Z79899 Other long term (current) drug therapy: Secondary | ICD-10-CM | POA: Diagnosis not present

## 2021-06-04 DIAGNOSIS — N920 Excessive and frequent menstruation with regular cycle: Secondary | ICD-10-CM | POA: Insufficient documentation

## 2021-06-04 DIAGNOSIS — Z8261 Family history of arthritis: Secondary | ICD-10-CM | POA: Insufficient documentation

## 2021-06-04 DIAGNOSIS — R5383 Other fatigue: Secondary | ICD-10-CM | POA: Insufficient documentation

## 2021-06-04 DIAGNOSIS — Z87440 Personal history of urinary (tract) infections: Secondary | ICD-10-CM | POA: Diagnosis not present

## 2021-06-04 DIAGNOSIS — F32A Depression, unspecified: Secondary | ICD-10-CM | POA: Diagnosis not present

## 2021-06-04 DIAGNOSIS — Z9049 Acquired absence of other specified parts of digestive tract: Secondary | ICD-10-CM | POA: Insufficient documentation

## 2021-06-04 LAB — CBC WITH DIFFERENTIAL/PLATELET
Abs Immature Granulocytes: 0.03 10*3/uL (ref 0.00–0.07)
Basophils Absolute: 0.1 10*3/uL (ref 0.0–0.1)
Basophils Relative: 1 %
Eosinophils Absolute: 0.2 10*3/uL (ref 0.0–0.5)
Eosinophils Relative: 2 %
HCT: 44.7 % (ref 36.0–46.0)
Hemoglobin: 14.3 g/dL (ref 12.0–15.0)
Immature Granulocytes: 0 %
Lymphocytes Relative: 25 %
Lymphs Abs: 2.4 10*3/uL (ref 0.7–4.0)
MCH: 28.8 pg (ref 26.0–34.0)
MCHC: 32 g/dL (ref 30.0–36.0)
MCV: 89.9 fL (ref 80.0–100.0)
Monocytes Absolute: 0.8 10*3/uL (ref 0.1–1.0)
Monocytes Relative: 9 %
Neutro Abs: 5.9 10*3/uL (ref 1.7–7.7)
Neutrophils Relative %: 63 %
Platelets: 298 10*3/uL (ref 150–400)
RBC: 4.97 MIL/uL (ref 3.87–5.11)
RDW: 12 % (ref 11.5–15.5)
WBC: 9.3 10*3/uL (ref 4.0–10.5)
nRBC: 0 % (ref 0.0–0.2)

## 2021-06-04 LAB — IRON AND IRON BINDING CAPACITY (CC-WL,HP ONLY)
Iron: 70 ug/dL (ref 28–170)
Saturation Ratios: 17 % (ref 10.4–31.8)
TIBC: 419 ug/dL (ref 250–450)
UIBC: 349 ug/dL (ref 148–442)

## 2021-06-04 LAB — FERRITIN: Ferritin: 42 ng/mL (ref 11–307)

## 2021-06-04 NOTE — Progress Notes (Signed)
[place] CONSULT NOTE  Patient Care Team: Early, Coralee Pesa, NP as PCP - General (Nurse Practitioner) Gavin Pound, MD as Consulting Physician (Rheumatology) Linda Hedges, DO as Consulting Physician (Obstetrics and Gynecology) Havery Moros, [name], MD as Consulting Physician (Gastroenterology)  CHIEF COMPLAINTS/PURPOSE OF CONSULTATION:  IDA  ASSESSMENT & PLAN:   Iron deficiency anemia  This is a very pleasant 36 year old female patient with past medical history significant for rheumatoid arthritis on hydroxychloroquine referred hematology for evaluation and recommendations regarding her severe iron deficiency. She received her last iron infusion in June 2022. She complains of worsening fatigue, hair loss, no pica.  She had a heavy menstrual cycle in January and February.  Prior to that she had no.  For almost 3 months. Labs today reviewed, no indication for intravenous iron at this time.  Hemoglobin is normal and ferritin although downtrending is still quite within normal limits.  She could consider taking oral ferrous sulfate once a day at this time and return to clinic in 6 months.  She can call us with any worsening symptoms or if she needs to return for sooner appointment. Thank you for consulting [place] in the care of this patient.  Please do not hesitate to contact us with any additional questions or concerns.   Orders Placed This Encounter  Procedures   CBC with Differential/Platelet    Standing Status:   Standing    Number of Occurrences:   22    Standing Expiration Date:   06/04/2022   Iron and Iron Binding Capacity ([facility] only)    Standing Status:   Future    Number of Occurrences:   1    Standing Expiration Date:   06/04/2022   Ferritin    Standing Status:   Future    Number of Occurrences:   1    Standing Expiration Date:   06/04/2022    Thank you for consulting [place] in the care of this patient please not hesitate to contact us with any questions or  concerns.  HISTORY OF PRESENTING ILLNESS:   Laura [name] 36 y.o. female is here because of IDA.    This is a very pleasant 36 year old female patient who arrived to the appointment by herself for evaluation and recommendations for an iron deficiency anemia.    Interim History  Since last visit, she is feeling more fatigued. She has noticed worsening hair loss. No PICA No worsening SOB.  She had a heavy menstrual cycle in Jan/Feb No change in bowel habits or urinary habits. She is wondering if she needs iron supplementation Rest of the pertinent 10 point ROS reviewed and negative.  MEDICAL HISTORY:  Past Medical History:  Diagnosis Date   Anemia    Anxiety    Arthritis    Depression    Depression    Phreesia 02/25/2020   Frequent headaches    History of chicken pox    Otitis media    had tubes as child, then according to pt adipose tissue used to repair ear drum   Rheumatoid arthritis ([place])    UTI (lower urinary tract infection)     SURGICAL HISTORY: Past Surgical History:  Procedure Laterality Date   APPENDECTOMY  2012   HYMENECTOMY  2000   TONSILECTOMY, ADENOIDECTOMY, BILATERAL MYRINGOTOMY AND TUBES      SOCIAL HISTORY: Social History   Socioeconomic History   Marital status: Single    Spouse name: Not on file   Number of children: 0  Years of education: Not on file   Highest education level: Not on file  Occupational History   Occupation: Teacher, music: [employer]  Tobacco Use   Smoking status: Never   Smokeless tobacco: Never  Vaping Use   Vaping Use: Never used  Substance and Sexual Activity   Alcohol use: Yes    Alcohol/week: 0.0 standard drinks    Comment: occasionally    Drug use: No   Sexual activity: Not Currently    Birth control/protection: Pill  Other Topics Concern   Not on file  Social History Narrative   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       Social Determinants of Health   Financial Resource Strain: Not on file  Food Insecurity: Not on file  Transportation Needs: Not on file  Physical Activity: Not on file  Stress: Not on file  Social Connections: Not on file  Intimate Partner Violence: Not on file    FAMILY HISTORY: Family History  Problem Relation Age of Onset   Arthritis Paternal Grandmother    Cancer Maternal Grandmother    Cancer Maternal Grandfather    Liver cancer Paternal Grandfather     ALLERGIES:  is allergic to kenalog [triamcinolone acetonide] and amoxicillin.  MEDICATIONS:  Current Outpatient Medications  Medication Sig Dispense Refill   cholecalciferol (VITAMIN D3) 25 MCG (1000 UNIT) tablet Take 1,000 Units by mouth daily.     cyclobenzaprine (FLEXERIL) 10 MG tablet Take 0.5-1 tablets (5-10 mg total) by mouth at bedtime. Caution: can cause drowsiness 30 tablet 1   escitalopram (LEXAPRO) 20 MG tablet Take 1 tablet (20 mg total) by mouth at bedtime. 90 tablet 3   HUMIRA PEN 40 MG/0.4ML PNKT SMARTSIG:40 Milligram(s) SUB-Q Every 2 Weeks     hydroxychloroquine (PLAQUENIL) 200 MG tablet Take 1 tablet (200 mg total) by mouth 2 (two) times daily. 60 tablet 0   ondansetron (ZOFRAN) 8 MG tablet Take 1 tablet (8 mg total) by mouth every 8 (eight) hours as needed for nausea. 30 tablet 2   [START ON 06/06/2021] Semaglutide-Weight Management 0.5 MG/0.5ML SOAJ Inject 0.5 mg into the skin once a week for 28 days. 2 mL 0   [START ON 07/05/2021] Semaglutide-Weight Management 1 MG/0.5ML SOAJ Inject 1 mg into the skin once a week for 28 days. 2 mL 2   Vitamin D, Ergocalciferol, (DRISDOL) 1.25 MG (50000 UNIT) CAPS capsule Take 1 capsule (50,000 Units total) by mouth every 7 (seven) days. Take for 8 total doses(weeks) 8 capsule 0   No current facility-administered medications for this visit.     PHYSICAL EXAMINATION: ECOG PERFORMANCE  STATUS: 1 - Symptomatic but completely ambulatory  Vitals:   06/04/21 1359  BP: 123/87  Pulse: 87  Resp: 16  Temp: 97.9 F (36.6 C)  SpO2: 98%    Filed Weights   06/04/21 1359  Weight: 263 lb (119.3 kg)    GENERAL:alert, no distress and comfortable, obese. SKIN: skin color, texture, turgor are normal, no rashes or significant lesions EYES: normal, mild pallor, sclera clear OROPHARYNX:no exudate, no erythema and lips, buccal mucosa, and tongue normal  NECK: supple, thyroid normal size, non-tender, without nodularity LYMPH:  no palpable lymphadenopathy in the cervical, axillary LUNGS: clear to auscultation and percussion with normal breathing effort HEART: regular rate & rhythm and no murmurs  and no lower extremity edema ABDOMEN:abdomen soft, non-tender and normal bowel sounds Musculoskeletal:no cyanosis of digits and no clubbing  PSYCH: alert & oriented x 3 with fluent speech NEURO: no focal motor/sensory deficits  LABORATORY DATA:  I have reviewed the data as listed Lab Results  Component Value Date   WBC 9.3 06/04/2021   HGB 14.3 06/04/2021   HCT 44.7 06/04/2021   MCV 89.9 06/04/2021   PLT 298 06/04/2021     Chemistry      Component Value Date/Time   NA 138 08/15/2020 1003   K 5.0 08/15/2020 1003   CL 101 08/15/2020 1003   CO2 23 08/15/2020 1003   BUN 10 08/15/2020 1003   CREATININE 0.90 08/15/2020 1003   CREATININE 0.80 04/29/2012 1724      Component Value Date/Time   CALCIUM 9.5 08/15/2020 1003   ALKPHOS 95 08/15/2020 1003   AST 25 08/15/2020 1003   ALT 33 (H) 08/15/2020 1003   BILITOT <0.2 08/15/2020 1003      Labs from today reviewed Hemoglobin of 14.3 g/dL, white blood cell count is normal, platelets are normal. Iron binding panel shows a TIBC of 419, iron saturation of 17% Ferritin of 42  RADIOGRAPHIC STUDIES: I have personally reviewed the radiological images as listed and agreed with the findings in the report. No results found.  All  questions were answered. The patient knows to call the clinic with any problems, questions or concerns. I spent a total of 20 minutes in the care of this patient including history and physical, review of records, counseling and coordination of care. Reviewed symptoms and signs of IDA, surveillance recommendations.    Benay Pike, MD 06/04/2021 4:10 PM

## 2021-06-04 NOTE — Assessment & Plan Note (Signed)
°  This is a very pleasant 37 year old female patient with past medical history significant for rheumatoid arthritis on hydroxychloroquine referred hematology for evaluation and recommendations regarding her severe iron deficiency. She received her last iron infusion in June 2022. She complains of worsening fatigue, hair loss, no pica.  She had a heavy menstrual cycle in January and February.  Prior to that she had no.  For almost 3 months. Labs today reviewed, no indication for intravenous iron at this time.  Hemoglobin is normal and ferritin although downtrending is still quite within normal limits.  She could consider taking oral ferrous sulfate once a day at this time and return to clinic in 6 months.  She can call us with any worsening symptoms or if she needs to return for sooner appointment. Thank you for consulting [place] in the care of this patient.  Please do not hesitate to contact us with any additional questions or concerns.

## 2021-06-05 ENCOUNTER — Telehealth: Payer: Self-pay | Admitting: Hematology and Oncology

## 2021-06-05 ENCOUNTER — Telehealth: Payer: Self-pay | Admitting: *Deleted

## 2021-06-05 NOTE — Telephone Encounter (Signed)
Scheduled appointment per 02/21 los. Left message.

## 2021-06-05 NOTE — Telephone Encounter (Addendum)
-----   Message from Benay Pike, MD sent at 06/04/2021  4:57 PM EST ----- CBC shows stable Hemoglobin, 14.3, normal. Ferritin downtrending but not low to need IV iron. If she can tolerate oral iron, she can OTC iron, ferrous sulfate once a day or we can repeat labs at next visit.  This RN called pt and obtained identified VM- message  left per above as well as this RN's name and return call number if further questions.

## 2021-06-13 ENCOUNTER — Other Ambulatory Visit (HOSPITAL_BASED_OUTPATIENT_CLINIC_OR_DEPARTMENT_OTHER): Payer: Self-pay | Admitting: Nurse Practitioner

## 2021-06-13 DIAGNOSIS — L219 Seborrheic dermatitis, unspecified: Secondary | ICD-10-CM

## 2021-06-13 DIAGNOSIS — G8929 Other chronic pain: Secondary | ICD-10-CM

## 2021-06-13 DIAGNOSIS — R7303 Prediabetes: Secondary | ICD-10-CM

## 2021-06-13 DIAGNOSIS — Z6841 Body Mass Index (BMI) 40.0 and over, adult: Secondary | ICD-10-CM

## 2021-06-13 DIAGNOSIS — E559 Vitamin D deficiency, unspecified: Secondary | ICD-10-CM

## 2021-06-13 DIAGNOSIS — D500 Iron deficiency anemia secondary to blood loss (chronic): Secondary | ICD-10-CM

## 2021-06-13 DIAGNOSIS — F330 Major depressive disorder, recurrent, mild: Secondary | ICD-10-CM

## 2021-06-13 DIAGNOSIS — F411 Generalized anxiety disorder: Secondary | ICD-10-CM

## 2021-06-13 DIAGNOSIS — M069 Rheumatoid arthritis, unspecified: Secondary | ICD-10-CM

## 2021-07-10 ENCOUNTER — Other Ambulatory Visit (HOSPITAL_BASED_OUTPATIENT_CLINIC_OR_DEPARTMENT_OTHER): Payer: Self-pay | Admitting: Nurse Practitioner

## 2021-07-10 ENCOUNTER — Encounter (HOSPITAL_BASED_OUTPATIENT_CLINIC_OR_DEPARTMENT_OTHER): Payer: Self-pay | Admitting: Nurse Practitioner

## 2021-07-10 DIAGNOSIS — E782 Mixed hyperlipidemia: Secondary | ICD-10-CM

## 2021-07-10 DIAGNOSIS — Z6841 Body Mass Index (BMI) 40.0 and over, adult: Secondary | ICD-10-CM

## 2021-07-10 DIAGNOSIS — K760 Fatty (change of) liver, not elsewhere classified: Secondary | ICD-10-CM

## 2021-07-10 DIAGNOSIS — R7303 Prediabetes: Secondary | ICD-10-CM

## 2021-07-10 DIAGNOSIS — E282 Polycystic ovarian syndrome: Secondary | ICD-10-CM

## 2021-07-10 MED ORDER — SEMAGLUTIDE(0.25 OR 0.5MG/DOS) 2 MG/1.5ML ~~LOC~~ SOPN: 0.5000 mg | PEN_INJECTOR | SUBCUTANEOUS | 3 refills | Status: DC

## 2021-08-06 ENCOUNTER — Encounter (HOSPITAL_BASED_OUTPATIENT_CLINIC_OR_DEPARTMENT_OTHER): Payer: Self-pay | Admitting: Nurse Practitioner

## 2021-08-06 ENCOUNTER — Telehealth: Payer: Self-pay | Admitting: Hematology and Oncology

## 2021-08-06 ENCOUNTER — Ambulatory Visit (HOSPITAL_BASED_OUTPATIENT_CLINIC_OR_DEPARTMENT_OTHER): Payer: Managed Care, Other (non HMO) | Admitting: Nurse Practitioner

## 2021-08-06 VITALS — BP 122/82 | HR 67 | Temp 98.7°F | Ht 66.0 in | Wt 250.0 lb

## 2021-08-06 DIAGNOSIS — Z6841 Body Mass Index (BMI) 40.0 and over, adult: Secondary | ICD-10-CM

## 2021-08-06 DIAGNOSIS — D509 Iron deficiency anemia, unspecified: Secondary | ICD-10-CM | POA: Diagnosis not present

## 2021-08-06 DIAGNOSIS — R7303 Prediabetes: Secondary | ICD-10-CM

## 2021-08-06 DIAGNOSIS — E782 Mixed hyperlipidemia: Secondary | ICD-10-CM

## 2021-08-06 DIAGNOSIS — E282 Polycystic ovarian syndrome: Secondary | ICD-10-CM

## 2021-08-06 DIAGNOSIS — K760 Fatty (change of) liver, not elsewhere classified: Secondary | ICD-10-CM | POA: Diagnosis not present

## 2021-08-06 DIAGNOSIS — E559 Vitamin D deficiency, unspecified: Secondary | ICD-10-CM

## 2021-08-06 MED ORDER — WEGOVY 1 MG/0.5ML ~~LOC~~ SOAJ: 1.0000 mg | SUBCUTANEOUS | 2 refills | Status: DC

## 2021-08-06 NOTE — Patient Instructions (Signed)
It was a pleasure seeing you today. I hope your time spent with [place] was pleasant and helpful. Please let us know if there is anything we can do to improve the service you receive.  ? ?I AM SO PROUD OF YOUR WEIGHT LOSS!!!!!  YOU ARE DOING AMAZING!!!!! ? ?Today we discussed concerns with: ? ?Iron deficiency anemia, unspecified iron deficiency anemia type ? ?Prediabetes ? ?Polycystic ovary syndrome ? ?Hepatic steatosis ? ?BMI 40.0-44.9, adult ([place]) ? ?Moderate mixed hyperlipidemia not requiring statin therapy ? ?Vitamin D deficiency ? ?We can certainly retry the '1mg'$  dose. I will send that in to the pharmacy for you today. If you start to have side effects, please let me know immediately and we can always make changes to to the dose.  ?We will check your labs today and make sure everything looks ok.  ? ?The following orders have been placed for you today: ? ?Orders Placed This Encounter  ?Procedures  ? CBC with Differential/Platelet  ?  Order Specific Question:   Release to patient  ?  Answer:   Immediate  ? Comprehensive metabolic panel  ?  Order Specific Question:   Has the patient fasted?  ?  Answer:   Yes  ?  Order Specific Question:   Release to patient  ?  Answer:   Immediate  ? Hemoglobin A1c  ?  Order Specific Question:   Release to patient  ?  Answer:   Immediate  ? Iron, TIBC and Ferritin Panel  ? LP+LDL Direct  ?  Order Specific Question:   Release to patient  ?  Answer:   Immediate  ?  Order Specific Question:   Remote health to draw?  ?  Answer:   No  ? VITAMIN D 25 Hydroxy (Vit-D Deficiency, Fractures)  ?  Order Specific Question:   Release to patient  ?  Answer:   Immediate  ? B12 and Folate Panel  ? TSH  ?  Order Specific Question:   Release to patient  ?  Answer:   Immediate  ? T4, free  ? ? ? ?Important Office Information ?Lab Results ?If labs were ordered, please note that you will see results through [facility] as soon as they come available from [facility].  ?It takes up to 5 business days for the results  to be routed to me and for me to review them once all of the lab results have come through from [facility]. I will make recommendations based on your results and send these through [place] or someone from the office will call you to discuss. If your labs are abnormal, we may contact you to schedule a visit to discuss the results and make recommendations.  ?If you have not heard from [place] within 5 business days or you have waited longer than a week and your lab results have not come through on [place], please feel free to call the office or send a message through [place] to follow-up on these labs.  ? ?Referrals ?If referrals were placed today, the office where the referral was sent will contact you either by phone or through [place] to set up scheduling. Please note that it can take up to a week for the referral office to contact you. If you do not hear from them in a week, please contact the referral office directly to inquire about scheduling.  ? ?Condition Treated ?If your condition worsens or you begin to have new symptoms, please schedule a follow-up appointment for further evaluation. If  you are not sure if an appointment is needed, you may call the office to leave a message for the nurse and someone will contact you with recommendations.  ?If you have an urgent or life threatening emergency, please do not call the office, but seek emergency evaluation by calling 911 or going to the nearest emergency room for evaluation.  ? ?MyChart and Phone Calls ?Please do not use MyChart for urgent messages. It may take up to 3 business days for MyChart messages to be read by staff and if they are unable to handle the request, an additional 3 business days for them to be routed to me and for my response.  ?Messages sent to the provider through [place] do not come directly to the provider, please allow time for these messages to be routed and for me to respond.  ?We get a large volume of MyChart messages daily and these are  responded to in the order received.  ? ?For urgent messages, please call the office at [phone] and speak with the front office staff or leave a message on the line of my assistant for guidance.  ?We are seeing patients from the hours of 8:00 am through 5:00 pm and calls directly to the nurse may not be answered immediately due to seeing patients, but your call will be returned as soon as possible.  ?Phone  messages received after 4:00 PM Monday through Thursday may not be returned until the following business day. Phone messages received after 11:00 AM on Friday may not be returned until Monday.  ? ?After Hours ?We share on call hours with providers from other offices. If you have an urgent need after hours that cannot wait until the next business day, please contact the on call provider by calling the office number. A nurse will speak with you and contact the provider if needed for recommendations.  ?If you have an urgent or life threatening emergency after hours, please do not call the on call provider, but seek emergency evaluation by calling 911 or going to the nearest emergency room for evaluation.  ? ?Paperwork ?All paperwork requires a minimum of 5 days to complete and return to you or the designated personnel. Please keep this in mind when bringing in forms or sending requests for paperwork completion to the office.  ?  ?

## 2021-08-06 NOTE — Telephone Encounter (Signed)
Rescheduled appointment per providers template. Left message.  ? ?

## 2021-08-06 NOTE — Progress Notes (Signed)
?Worthy Keeler, DNP, AGNP-c ?[facility] ?[address] ?[address] ?[place], [place] [address] ?[phone] Office [phone] Fax ? ?ESTABLISHED PATIENT- Chronic Health and/or Follow-Up Visit ? ?Blood pressure 122/82, pulse 67, temperature 98.7 ?F (37.1 ?C), height '5\' 6"'$  (1.676 m), weight 250 lb (113.4 kg), SpO2 96 %. ? ?Obesity ? ? ?HPI ? ?Laura [name]  is a 36 y.o. year old female presenting today for evaluation and management of the following: ?Weight management ?Laura [name] reports she has been doing very well on the [facility] ?She has had a total of 22 pounds of weight loss  ?Significant improvement in her overall feeling of wellbeing and health ?She also endorses that since starting on the medications her.'s have regulated ?She denies any significant side effects of the medication and is tolerating it well ?She would like to continue on this medication for her weight loss efforts ?She is monitoring her diet and avoiding excessive calories and carbohydrate intake ?She is working on increasing her daily activity ?She denies significant nausea, vomiting, diarrhea, abdominal pain ? ? ?ROS ?All ROS negative with exception of what is listed in HPI ? ?PHYSICAL EXAM ?Physical Exam ?Vitals and nursing note reviewed.  ?Constitutional:   ?   Appearance: Normal appearance.  ?HENT:  ?   Head: Normocephalic.  ?Eyes:  ?   Extraocular Movements: Extraocular movements intact.  ?   Conjunctiva/sclera: Conjunctivae normal.  ?   Pupils: Pupils are equal, round, and reactive to light.  ?Neck:  ?   Vascular: No carotid bruit.  ?Cardiovascular:  ?   Rate and Rhythm: Normal rate and regular rhythm.  ?   Pulses: Normal pulses.  ?   Heart sounds: Normal heart sounds.  ?Pulmonary:  ?   Effort: Pulmonary effort is normal.  ?   Breath sounds: Normal breath sounds.  ?Abdominal:  ?   General: Bowel sounds are normal. There is no distension.  ?   Palpations: Abdomen is soft.  ?   Tenderness: There is  no abdominal tenderness. There is no guarding.  ?Musculoskeletal:     ?   General: Normal range of motion.  ?   Cervical back: Normal range of motion.  ?   Right lower leg: No edema.  ?   Left lower leg: No edema.  ?Skin: ?   General: Skin is warm and dry.  ?   Capillary Refill: Capillary refill takes less than 2 seconds.  ?Neurological:  ?   General: No focal deficit present.  ?   Mental Status: She is alert and oriented to person, place, and time.  ?Psychiatric:     ?   Mood and Affect: Mood normal.     ?   Behavior: Behavior normal.     ?   Thought Content: Thought content normal.     ?   Judgment: Judgment normal.  ? ? ?ASSESSMENT & PLAN ?Problem List Items Addressed This Visit   ? ? Prediabetes  ?  History of prediabetes with significant comorbidities including hepatic steatosis, rheumatoid arthritis, obesity, hyperlipidemia, and BMI greater than 40. ?Today she has lost 22 pounds and she has had significant reduction in symptoms.  I suspect that her hemoglobin A1c will have normalized based on the response she has had from the medication.  She is following a diet and exercise regimen appropriate for diabetes and weight management. ?We will plan to continue the medication at this time. ? ?  ?  ? Relevant Medications  ?  Semaglutide-Weight Management (WEGOVY) 1 MG/0.5ML SOAJ  ? Other Relevant Orders  ? CBC with Differential/Platelet (Completed)  ? Comprehensive metabolic panel (Completed)  ? Hemoglobin A1c (Completed)  ? Iron, TIBC and Ferritin Panel (Completed)  ? LP+LDL Direct (Completed)  ? B12 and Folate Panel (Completed)  ? Vitamin D deficiency  ?  History of vitamin D deficiency.  Will obtain labs for evaluation today. ? ?  ?  ? Relevant Medications  ? Semaglutide-Weight Management (WEGOVY) 1 MG/0.5ML SOAJ  ? Other Relevant Orders  ? VITAMIN D 25 Hydroxy (Vit-D Deficiency, Fractures) (Completed)  ? Iron deficiency anemia - Primary  ?  History of iron deficiency anemia.  We will obtain labs for evaluation  today. ? ?  ?  ? Relevant Orders  ? CBC with Differential/Platelet (Completed)  ? Comprehensive metabolic panel (Completed)  ? Hemoglobin A1c (Completed)  ? Iron, TIBC and Ferritin Panel (Completed)  ? LP+LDL Direct (Completed)  ? B12 and Folate Panel (Completed)  ? Polycystic ovary syndrome  ?  Chronic.  Since recent 22 pound weight loss she has had regulation of her menstrual cycles which is quite significant.  Clearly the medication is working to help better manage her insulin levels and weight. ?We will plan to continue medication. ? ?  ?  ? Relevant Medications  ? Semaglutide-Weight Management (WEGOVY) 1 MG/0.5ML SOAJ  ? Other Relevant Orders  ? CBC with Differential/Platelet (Completed)  ? Comprehensive metabolic panel (Completed)  ? Hemoglobin A1c (Completed)  ? Iron, TIBC and Ferritin Panel (Completed)  ? LP+LDL Direct (Completed)  ? B12 and Folate Panel (Completed)  ? TSH (Completed)  ? T4, free (Completed)  ? Hepatic steatosis  ?  Recent 22 pound weight loss with semaglutide for weight management.  We will obtain labs today for evaluation.  No alarm symptoms present today.  Expect improvement on overall health and risks associated with hepatic steatosis based on her recent weight loss efforts. ? ?  ?  ? Relevant Medications  ? Semaglutide-Weight Management (WEGOVY) 1 MG/0.5ML SOAJ  ? Other Relevant Orders  ? CBC with Differential/Platelet (Completed)  ? Comprehensive metabolic panel (Completed)  ? Hemoglobin A1c (Completed)  ? Iron, TIBC and Ferritin Panel (Completed)  ? LP+LDL Direct (Completed)  ? BMI 40.0-44.9, adult ([place])  ?  BMI greater than 40 in the setting of prediabetes, hepatic steatosis, PCOS, hyperlipidemia.  Risk factors for cardiovascular disease are quite high based on her health history.  To date she has lost 22 pounds after starting on semaglutide for blood sugar and weight control.  She is doing excellent in her efforts for dietary and exercise changes and making appropriate lifestyle  changes that can be abided by long-term.  We will obtain labs today for further evaluation.  Plan to continue medications at this time. ? ?  ?  ? Relevant Medications  ? Semaglutide-Weight Management (WEGOVY) 1 MG/0.5ML SOAJ  ? Other Relevant Orders  ? CBC with Differential/Platelet (Completed)  ? Comprehensive metabolic panel (Completed)  ? Hemoglobin A1c (Completed)  ? Iron, TIBC and Ferritin Panel (Completed)  ? LP+LDL Direct (Completed)  ? B12 and Folate Panel (Completed)  ? TSH (Completed)  ? T4, free (Completed)  ? Moderate mixed hyperlipidemia not requiring statin therapy  ?  Elevated lipids with multiple comorbidities present.  She was recently lost 22 pounds with semaglutide which is quite impressive.  We will obtain labs today for evaluation on impact on lipids this is had.  She has been monitoring her  diet and working on her exercise routine to help further with her weight loss efforts and lifestyle changes necessary to continue with weight loss in the future. ?We will plan to continue medication at this time and monitor closely. ? ?  ?  ? Relevant Medications  ? Semaglutide-Weight Management (WEGOVY) 1 MG/0.5ML SOAJ  ? Other Relevant Orders  ? CBC with Differential/Platelet (Completed)  ? Comprehensive metabolic panel (Completed)  ? Hemoglobin A1c (Completed)  ? Iron, TIBC and Ferritin Panel (Completed)  ? LP+LDL Direct (Completed)  ? ? ? ?FOLLOW-UP ?Return in about 6 months (around 02/05/2022) for Weight management- Wegovy. ? ? ? ?Worthy Keeler, DNP, AGNP-c ?08/06/2021  1:22 PM ?

## 2021-08-07 LAB — CBC WITH DIFFERENTIAL/PLATELET
Basophils Absolute: 0.1 10*3/uL (ref 0.0–0.2)
Basos: 1 %
EOS (ABSOLUTE): 0.1 10*3/uL (ref 0.0–0.4)
Eos: 1 %
Hematocrit: 45.9 % (ref 34.0–46.6)
Hemoglobin: 15.3 g/dL (ref 11.1–15.9)
Immature Grans (Abs): 0.1 10*3/uL (ref 0.0–0.1)
Immature Granulocytes: 1 %
Lymphocytes Absolute: 2.1 10*3/uL (ref 0.7–3.1)
Lymphs: 21 %
MCH: 28.7 pg (ref 26.6–33.0)
MCHC: 33.3 g/dL (ref 31.5–35.7)
MCV: 86 fL (ref 79–97)
Monocytes Absolute: 0.9 10*3/uL (ref 0.1–0.9)
Monocytes: 9 %
Neutrophils Absolute: 6.7 10*3/uL (ref 1.4–7.0)
Neutrophils: 67 %
Platelets: 291 10*3/uL (ref 150–450)
RBC: 5.33 x10E6/uL — ABNORMAL HIGH (ref 3.77–5.28)
RDW: 11.6 % — ABNORMAL LOW (ref 11.7–15.4)
WBC: 9.9 10*3/uL (ref 3.4–10.8)

## 2021-08-07 LAB — T4, FREE: Free T4: 1.04 ng/dL (ref 0.82–1.77)

## 2021-08-07 LAB — COMPREHENSIVE METABOLIC PANEL
ALT: 33 IU/L — ABNORMAL HIGH (ref 0–32)
AST: 26 IU/L (ref 0–40)
Albumin/Globulin Ratio: 1.7 (ref 1.2–2.2)
Albumin: 4.3 g/dL (ref 3.8–4.8)
Alkaline Phosphatase: 94 IU/L (ref 44–121)
BUN/Creatinine Ratio: 9 (ref 9–23)
BUN: 8 mg/dL (ref 6–20)
Bilirubin Total: 0.2 mg/dL (ref 0.0–1.2)
CO2: 24 mmol/L (ref 20–29)
Calcium: 9.6 mg/dL (ref 8.7–10.2)
Chloride: 104 mmol/L (ref 96–106)
Creatinine, Ser: 0.87 mg/dL (ref 0.57–1.00)
Globulin, Total: 2.5 g/dL (ref 1.5–4.5)
Glucose: 86 mg/dL (ref 70–99)
Potassium: 4.5 mmol/L (ref 3.5–5.2)
Sodium: 144 mmol/L (ref 134–144)
Total Protein: 6.8 g/dL (ref 6.0–8.5)
eGFR: 88 mL/min/{1.73_m2} (ref >59)

## 2021-08-07 LAB — IRON,TIBC AND FERRITIN PANEL
Ferritin: 39 ng/mL (ref 15–150)
Iron Saturation: 14 % — ABNORMAL LOW (ref 15–55)
Iron: 56 ug/dL (ref 27–159)
Total Iron Binding Capacity: 393 ug/dL (ref 250–450)
UIBC: 337 ug/dL (ref 131–425)

## 2021-08-07 LAB — LP+LDL DIRECT
Cholesterol, Total: 181 mg/dL (ref 100–199)
HDL: 39 mg/dL — ABNORMAL LOW (ref >39)
LDL Chol Calc (NIH): 120 mg/dL — ABNORMAL HIGH (ref 0–99)
LDL Direct: 122 mg/dL — ABNORMAL HIGH (ref 0–99)
Triglycerides: 119 mg/dL (ref 0–149)
VLDL Cholesterol Cal: 22 mg/dL (ref 5–40)

## 2021-08-07 LAB — VITAMIN D 25 HYDROXY (VIT D DEFICIENCY, FRACTURES): Vit D, 25-Hydroxy: 19.8 ng/mL — ABNORMAL LOW (ref 30.0–100.0)

## 2021-08-07 LAB — HEMOGLOBIN A1C
Est. average glucose Bld gHb Est-mCnc: 105 mg/dL
Hgb A1c MFr Bld: 5.3 % (ref 4.8–5.6)

## 2021-08-07 LAB — TSH: TSH: 1.16 u[IU]/mL (ref 0.450–4.500)

## 2021-08-07 LAB — B12 AND FOLATE PANEL
Folate: 3.2 ng/mL (ref >3.0)
Vitamin B-12: 317 pg/mL (ref 232–1245)

## 2021-08-16 NOTE — Assessment & Plan Note (Signed)
History of vitamin D deficiency.  Will obtain labs for evaluation today. ?

## 2021-08-16 NOTE — Assessment & Plan Note (Signed)
History of prediabetes with significant comorbidities including hepatic steatosis, rheumatoid arthritis, obesity, hyperlipidemia, and BMI greater than 40. ?Today she has lost 22 pounds and she has had significant reduction in symptoms.  I suspect that her hemoglobin A1c will have normalized based on the response she has had from the medication.  She is following a diet and exercise regimen appropriate for diabetes and weight management. ?We will plan to continue the medication at this time. ?

## 2021-08-16 NOTE — Assessment & Plan Note (Signed)
Elevated lipids with multiple comorbidities present.  She was recently lost 22 pounds with semaglutide which is quite impressive.  We will obtain labs today for evaluation on impact on lipids this is had.  She has been monitoring her diet and working on her exercise routine to help further with her weight loss efforts and lifestyle changes necessary to continue with weight loss in the future. ?We will plan to continue medication at this time and monitor closely. ?

## 2021-08-16 NOTE — Assessment & Plan Note (Signed)
Chronic.  Since recent 22 pound weight loss she has had regulation of her menstrual cycles which is quite significant.  Clearly the medication is working to help better manage her insulin levels and weight. ?We will plan to continue medication. ?

## 2021-08-16 NOTE — Assessment & Plan Note (Signed)
Recent 22 pound weight loss with semaglutide for weight management.  We will obtain labs today for evaluation.  No alarm symptoms present today.  Expect improvement on overall health and risks associated with hepatic steatosis based on her recent weight loss efforts. ?

## 2021-08-16 NOTE — Assessment & Plan Note (Signed)
History of iron deficiency anemia.  We will obtain labs for evaluation today. ?

## 2021-08-16 NOTE — Assessment & Plan Note (Signed)
BMI greater than 40 in the setting of prediabetes, hepatic steatosis, PCOS, hyperlipidemia.  Risk factors for cardiovascular disease are quite high based on her health history.  To date she has lost 22 pounds after starting on semaglutide for blood sugar and weight control.  She is doing excellent in her efforts for dietary and exercise changes and making appropriate lifestyle changes that can be abided by long-term.  We will obtain labs today for further evaluation.  Plan to continue medications at this time. ?

## 2021-10-03 ENCOUNTER — Ambulatory Visit: Payer: Managed Care, Other (non HMO) | Admitting: Hematology and Oncology

## 2021-10-10 ENCOUNTER — Inpatient Hospital Stay: Payer: Managed Care, Other (non HMO)

## 2021-10-10 ENCOUNTER — Inpatient Hospital Stay: Payer: Managed Care, Other (non HMO) | Attending: Hematology and Oncology | Admitting: Hematology and Oncology

## 2021-10-10 ENCOUNTER — Other Ambulatory Visit: Payer: Self-pay

## 2021-10-10 ENCOUNTER — Encounter: Payer: Self-pay | Admitting: Hematology and Oncology

## 2021-10-10 DIAGNOSIS — Z79899 Other long term (current) drug therapy: Secondary | ICD-10-CM | POA: Insufficient documentation

## 2021-10-10 DIAGNOSIS — Z809 Family history of malignant neoplasm, unspecified: Secondary | ICD-10-CM | POA: Diagnosis not present

## 2021-10-10 DIAGNOSIS — D500 Iron deficiency anemia secondary to blood loss (chronic): Secondary | ICD-10-CM

## 2021-10-10 DIAGNOSIS — Z800 Family history of malignant neoplasm of digestive organs: Secondary | ICD-10-CM | POA: Diagnosis not present

## 2021-10-10 DIAGNOSIS — R5383 Other fatigue: Secondary | ICD-10-CM | POA: Insufficient documentation

## 2021-10-10 DIAGNOSIS — F419 Anxiety disorder, unspecified: Secondary | ICD-10-CM | POA: Diagnosis not present

## 2021-10-10 DIAGNOSIS — Z8261 Family history of arthritis: Secondary | ICD-10-CM | POA: Insufficient documentation

## 2021-10-10 DIAGNOSIS — R634 Abnormal weight loss: Secondary | ICD-10-CM | POA: Diagnosis not present

## 2021-10-10 DIAGNOSIS — Z87440 Personal history of urinary (tract) infections: Secondary | ICD-10-CM | POA: Insufficient documentation

## 2021-10-10 DIAGNOSIS — Z880 Allergy status to penicillin: Secondary | ICD-10-CM | POA: Diagnosis not present

## 2021-10-10 DIAGNOSIS — M069 Rheumatoid arthritis, unspecified: Secondary | ICD-10-CM | POA: Diagnosis not present

## 2021-10-10 DIAGNOSIS — D509 Iron deficiency anemia, unspecified: Secondary | ICD-10-CM | POA: Diagnosis present

## 2021-10-10 DIAGNOSIS — F32A Depression, unspecified: Secondary | ICD-10-CM | POA: Diagnosis not present

## 2021-10-10 DIAGNOSIS — Z9049 Acquired absence of other specified parts of digestive tract: Secondary | ICD-10-CM | POA: Insufficient documentation

## 2021-10-10 LAB — CBC WITH DIFFERENTIAL/PLATELET
Abs Immature Granulocytes: 0.04 10*3/uL (ref 0.00–0.07)
Basophils Absolute: 0.1 10*3/uL (ref 0.0–0.1)
Basophils Relative: 1 %
Eosinophils Absolute: 0.2 10*3/uL (ref 0.0–0.5)
Eosinophils Relative: 2 %
HCT: 45.4 % (ref 36.0–46.0)
Hemoglobin: 14.9 g/dL (ref 12.0–15.0)
Immature Granulocytes: 0 %
Lymphocytes Relative: 22 %
Lymphs Abs: 2.1 10*3/uL (ref 0.7–4.0)
MCH: 28.4 pg (ref 26.0–34.0)
MCHC: 32.8 g/dL (ref 30.0–36.0)
MCV: 86.5 fL (ref 80.0–100.0)
Monocytes Absolute: 0.6 10*3/uL (ref 0.1–1.0)
Monocytes Relative: 7 %
Neutro Abs: 6.6 10*3/uL (ref 1.7–7.7)
Neutrophils Relative %: 68 %
Platelets: 267 10*3/uL (ref 150–400)
RBC: 5.25 MIL/uL — ABNORMAL HIGH (ref 3.87–5.11)
RDW: 12.6 % (ref 11.5–15.5)
WBC: 9.5 10*3/uL (ref 4.0–10.5)
nRBC: 0 % (ref 0.0–0.2)

## 2021-10-10 LAB — IRON AND IRON BINDING CAPACITY (CC-WL,HP ONLY)
Iron: 59 ug/dL (ref 28–170)
Saturation Ratios: 13 % (ref 10.4–31.8)
TIBC: 452 ug/dL — ABNORMAL HIGH (ref 250–450)
UIBC: 393 ug/dL (ref 148–442)

## 2021-10-10 LAB — FERRITIN: Ferritin: 16 ng/mL (ref 11–307)

## 2021-10-10 NOTE — Progress Notes (Signed)
[facility] CONSULT NOTE  Patient Care Team: Early, Coralee Pesa, NP as PCP - General (Nurse Practitioner) Gavin Pound, MD as Consulting Physician (Rheumatology) Linda Hedges, DO as Consulting Physician (Obstetrics and Gynecology) Havery Moros, [name], MD as Consulting Physician (Gastroenterology)  CHIEF COMPLAINTS/PURPOSE OF CONSULTATION:  IDA  ASSESSMENT & PLAN:   Iron deficiency anemia  This is a very pleasant 36 year old female patient with past medical history significant for rheumatoid arthritis on hydroxychloroquine referred hematology for evaluation and recommendations regarding her severe iron deficiency. She received her last iron infusion in June 2022. During her last visit in April, she reported worsening symptoms however CBC showed a hemoglobin of 15 and ferritin at 39. Today she complains of worsening fatigue and some hair loss and she wonders if she is iron deficient again.  No physical examination findings, patient has been working on weight loss, has lost about 35 pounds since her last visit and is currently on semaglutide. Labs from today show a CBC with hemoglobin of 14.9, ferritin is pending. We have discussed that if her ferritin continues to be lower than her last visit, we may want to start her back on oral iron supplementation and repeat labs in about 3 months.  She is agreeable to this approach. Return to clinic in 6 months in person Thank you for consulting [place] the care of this patient.  Please not hesitate contact us with any additional questions or concerns.    Orders Placed This Encounter  Procedures   CBC with Differential/Platelet    Standing Status:   Standing    Number of Occurrences:   22    Standing Expiration Date:   10/11/2022   Iron and Iron Binding Capacity ([facility] only)    Standing Status:   Future    Number of Occurrences:   1    Standing Expiration Date:   10/11/2022   Ferritin    Standing Status:   Future    Number of  Occurrences:   1    Standing Expiration Date:   10/10/2022    Thank you for consulting [place] in the care of this patient please not hesitate to contact us with any questions or concerns.  HISTORY OF PRESENTING ILLNESS:   Laura [name] 36 y.o. female is here because of IDA.    This is a very pleasant 36 year old female patient who arrived to the appointment by herself for evaluation and recommendations for an iron deficiency anemia.    Interim History  She is here for follow up on IDA. She says she feels like her iron is low, she has noticed a bit more fatigue and hair loss.  She is working with the weight loss management clinic and has lost about 35 to 40 pounds, she is on semaglutide currently.  She has not noticed any pica.  Her menstrual cycles are more regular and not described as heavy.  She is not on any oral iron supplementation.  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  She works 3 days 12 hours a day and has been taking some photography classes in her free time.  Rest of the pertinent 10 point ROS reviewed and negative   MEDICAL HISTORY:  Past Medical History:  Diagnosis Date   Anemia    Anxiety    Arthritis    Depression    Depression    Phreesia 02/25/2020   Frequent headaches    History of chicken pox    Otitis media  had tubes as child, then according to pt adipose tissue used to repair ear drum   Rheumatoid arthritis ([place])    UTI (lower urinary tract infection)     SURGICAL HISTORY: Past Surgical History:  Procedure Laterality Date   APPENDECTOMY  2012   HYMENECTOMY  2000   TONSILECTOMY, ADENOIDECTOMY, BILATERAL MYRINGOTOMY AND TUBES      SOCIAL HISTORY: Social History   Socioeconomic History   Marital status: Single    Spouse name: Not on file   Number of children: 0   Years of education: Not on file   Highest education level: Not on file  Occupational History   Occupation: Teacher, music: [employer]  Tobacco Use    Smoking status: Never   Smokeless tobacco: Never  Vaping Use   Vaping Use: Never used  Substance and Sexual Activity   Alcohol use: Yes    Alcohol/week: 0.0 standard drinks of alcohol    Comment: occasionally    Drug use: No   Sexual activity: Not Currently    Birth control/protection: Pill  Other Topics Concern   Not on file  Social History Narrative   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       Social Determinants of Health   Financial Resource Strain: Not on file  Food Insecurity: Not on file  Transportation Needs: Not on file  Physical Activity: Not on file  Stress: Not on file  Social Connections: Not on file  Intimate Partner Violence: Not on file    FAMILY HISTORY: Family History  Problem Relation Age of Onset   Arthritis Paternal Grandmother    Cancer Maternal Grandmother    Cancer Maternal Grandfather    Liver cancer Paternal Grandfather     ALLERGIES:  is allergic to kenalog [triamcinolone acetonide] and amoxicillin.  MEDICATIONS:  Current Outpatient Medications  Medication Sig Dispense Refill   cholecalciferol (VITAMIN D3) 25 MCG (1000 UNIT) tablet Take 1,000 Units by mouth daily.     cyclobenzaprine (FLEXERIL) 10 MG tablet Take 0.5-1 tablets (5-10 mg total) by mouth at bedtime. Caution: can cause drowsiness 30 tablet 1   escitalopram (LEXAPRO) 20 MG tablet TAKE 1 TABLET(20 MG) BY MOUTH AT BEDTIME 90 tablet 3   HUMIRA PEN 40 MG/0.4ML PNKT SMARTSIG:40 Milligram(s) SUB-Q Every 2 Weeks     hydroxychloroquine (PLAQUENIL) 200 MG tablet Take 1 tablet (200 mg total) by mouth 2 (two) times daily. 60 tablet 0   ondansetron (ZOFRAN) 8 MG tablet Take 1 tablet (8 mg total) by mouth every 8 (eight) hours as needed for nausea. 30 tablet 2   Semaglutide-Weight Management (WEGOVY) 1 MG/0.5ML SOAJ Inject 1 mg into the skin once a  week. 6 mL 2   Vitamin D, Ergocalciferol, (DRISDOL) 1.25 MG (50000 UNIT) CAPS capsule Take 1 capsule (50,000 Units total) by mouth every 7 (seven) days. Take for 8 total doses(weeks) 8 capsule 0   No current facility-administered medications for this visit.     PHYSICAL EXAMINATION: ECOG PERFORMANCE STATUS: 1 - Symptomatic but completely ambulatory  Vitals:   10/10/21 1326  BP: (!) 128/98  Pulse: 83  Resp: 16  Temp: (!) 97.3 F (36.3 C)  SpO2: 97%     Filed Weights   10/10/21 1326  Weight: 239 lb 9 oz (108.7 kg)   GENERAL:alert, no distress and  comfortable, obese. SKIN: skin color, texture, turgor are normal, no rashes or significant lesions EYES: normal, mild pallor, sclera clear OROPHARYNX:no exudate, no erythema and lips, buccal mucosa, and tongue normal  NECK: supple, thyroid normal size, non-tender, without nodularity LYMPH:  no palpable lymphadenopathy in the cervical, axillary LUNGS: clear to auscultation and percussion with normal breathing effort HEART: regular rate & rhythm and no murmurs and no lower extremity edema ABDOMEN:abdomen soft, non-tender and normal bowel sounds Musculoskeletal:no cyanosis of digits and no clubbing  PSYCH: alert & oriented x 3 with fluent speech NEURO: no focal motor/sensory deficits  LABORATORY DATA:  I have reviewed the data as listed Lab Results  Component Value Date   WBC 9.5 10/10/2021   HGB 14.9 10/10/2021   HCT 45.4 10/10/2021   MCV 86.5 10/10/2021   PLT 267 10/10/2021     Chemistry      Component Value Date/Time   NA 144 08/06/2021 1411   K 4.5 08/06/2021 1411   CL 104 08/06/2021 1411   CO2 24 08/06/2021 1411   BUN 8 08/06/2021 1411   CREATININE 0.87 08/06/2021 1411   CREATININE 0.80 04/29/2012 1724      Component Value Date/Time   CALCIUM 9.6 08/06/2021 1411   ALKPHOS 94 08/06/2021 1411   AST 26 08/06/2021 1411   ALT 33 (H) 08/06/2021 1411   BILITOT 0.2 08/06/2021 1411      Labs from today showed  hemoglobin of 14.9, normal white blood cell count and platelet count Iron and iron binding panel showed a TIBC of 452, ferritin in process  RADIOGRAPHIC STUDIES: I have personally reviewed the radiological images as listed and agreed with the findings in the report. No results found.  All questions were answered. The patient knows to call the clinic with any problems, questions or concerns. I spent a total of 20 minutes in the care of this patient including history and physical, review of records, counseling and coordination of care.     Benay Pike, MD 10/10/2021 3:24 PM

## 2021-10-10 NOTE — Assessment & Plan Note (Addendum)
  This is a very pleasant 36 year old female patient with past medical history significant for rheumatoid arthritis on hydroxychloroquine referred hematology for evaluation and recommendations regarding her severe iron deficiency. She received her last iron infusion in June 2022. During her last visit in April, she reported worsening symptoms however CBC showed a hemoglobin of 15 and ferritin at 39. Today she complains of worsening fatigue and some hair loss and she wonders if she is iron deficient again.  No physical examination findings, patient has been working on weight loss, has lost about 35 pounds since her last visit and is currently on semaglutide. Labs from today show a CBC with hemoglobin of 14.9, ferritin is pending. We have discussed that if her ferritin continues to be lower than her last visit, we may want to start her back on oral iron supplementation and repeat labs in about 3 months.  She is agreeable to this approach. Return to clinic in 6 months in person Thank you for consulting [place] the care of this patient.  Please not hesitate contact us with any additional questions or concerns.

## 2021-10-18 ENCOUNTER — Other Ambulatory Visit: Payer: Self-pay | Admitting: *Deleted

## 2021-10-18 ENCOUNTER — Telehealth: Payer: Self-pay | Admitting: *Deleted

## 2021-10-18 DIAGNOSIS — D500 Iron deficiency anemia secondary to blood loss (chronic): Secondary | ICD-10-CM

## 2021-10-18 NOTE — Telephone Encounter (Addendum)
-----   Message from Benay Pike, MD sent at 10/14/2021  4:53 PM EDT ----- Hemoglobin continues to be in normal range however iron stores are slowly dropping.  I would recommend that she start oral iron supplementation ferrous sulfate 65 mg once a day until follow-up  This RN spoke with pt per above - pt verbalized understanding.

## 2021-11-06 ENCOUNTER — Other Ambulatory Visit: Payer: Managed Care, Other (non HMO)

## 2021-11-27 IMAGING — US US ABDOMEN COMPLETE
1 series · 14 of 25 positions shown · non-contrast
Comparison: None.

CLINICAL DATA: Elevated liver enzymes.

EXAM:
ABDOMEN ULTRASOUND COMPLETE

[Series 1: us abdomen complete · 0.25mm/px · 14 of 88 slices shown]
[im 1/88]
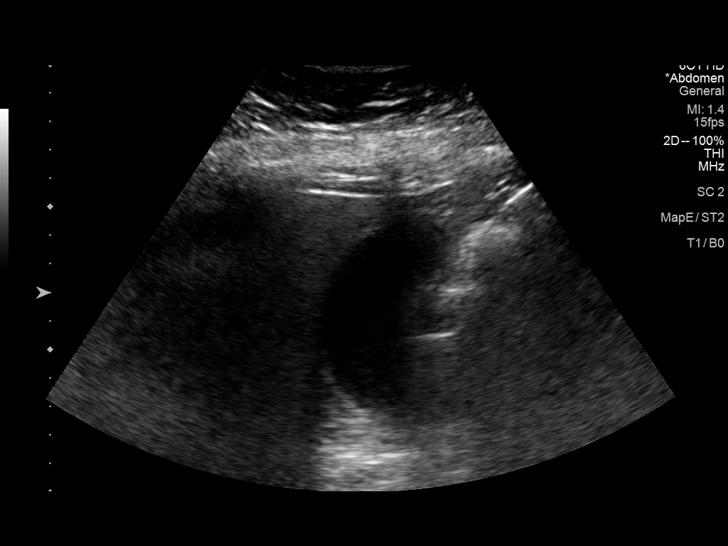
[im 8/88]
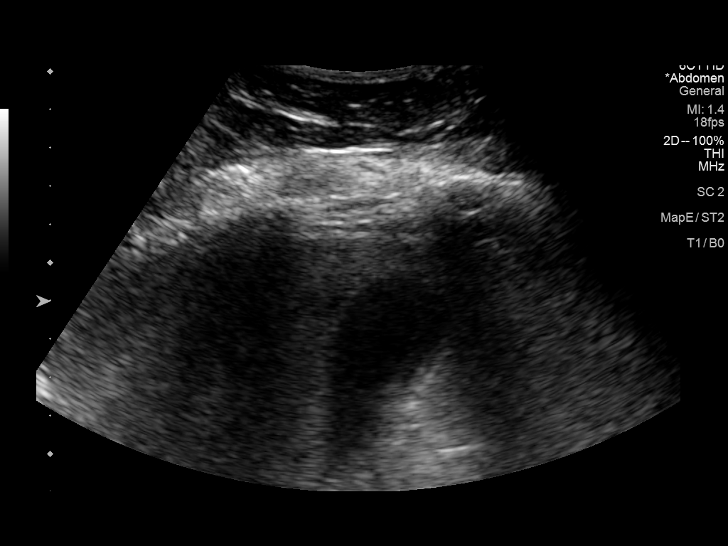
[im 15/88]
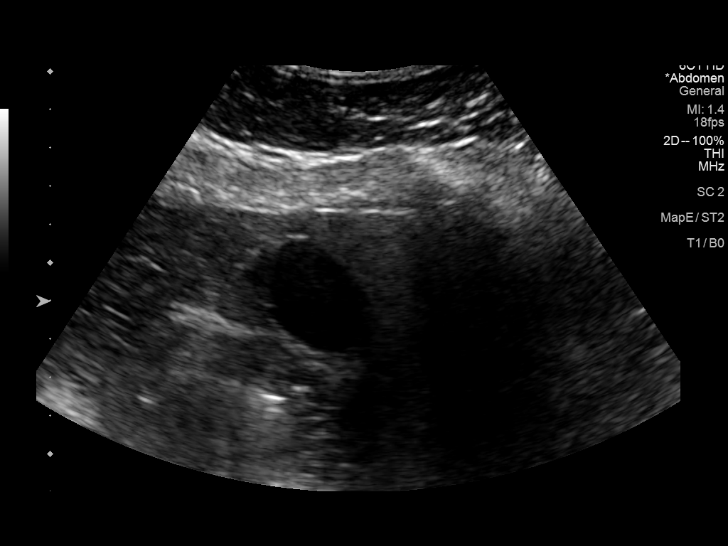
[im 22/88]
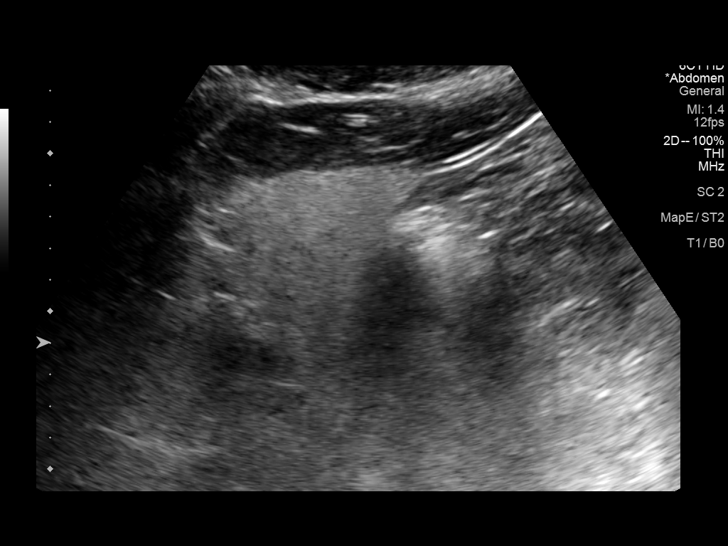
[im 30/88]
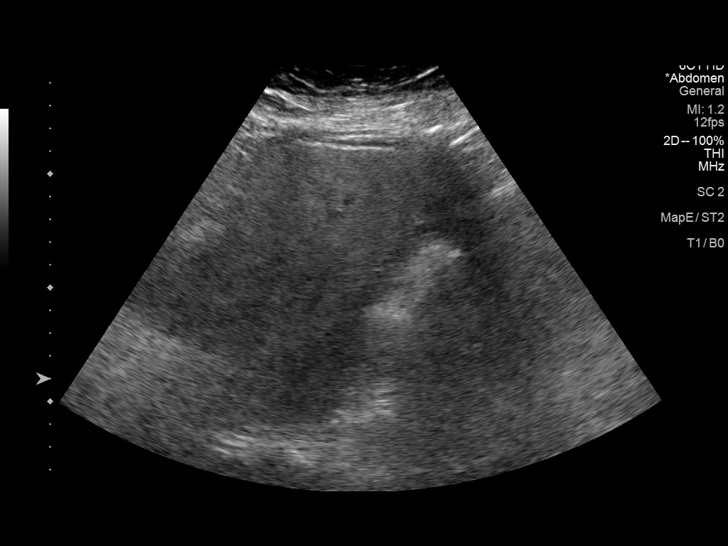
[im 33/88]
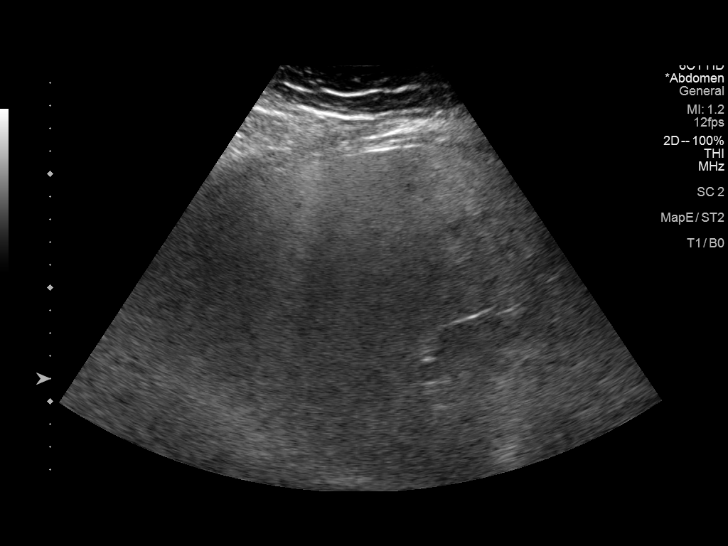
[im 40/88]
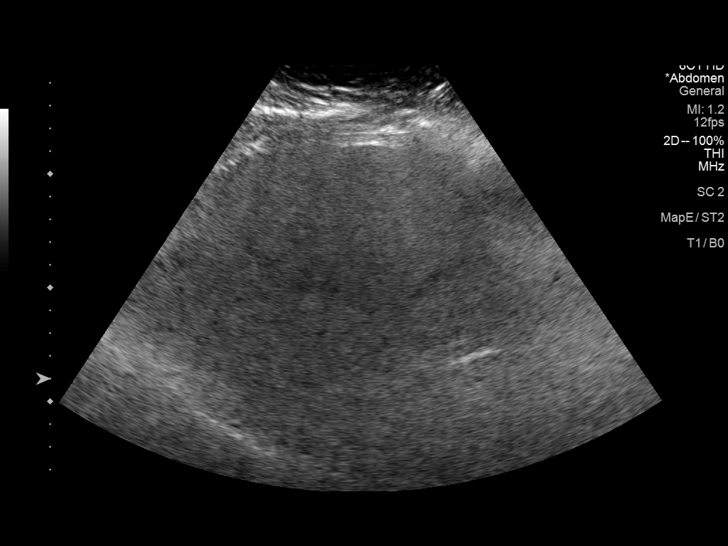
[im 48/88]
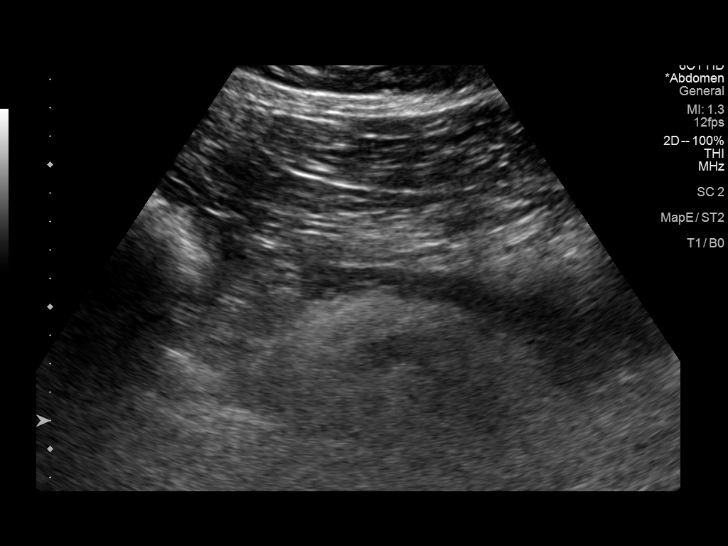
[im 55/88]
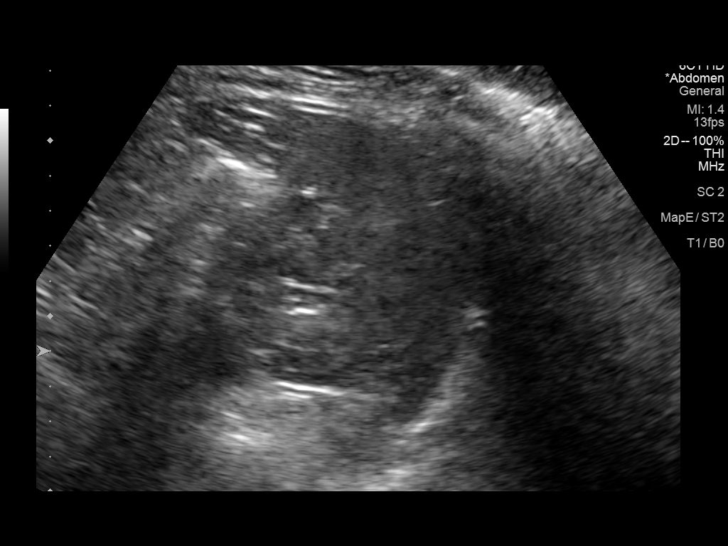
[im 59/88]
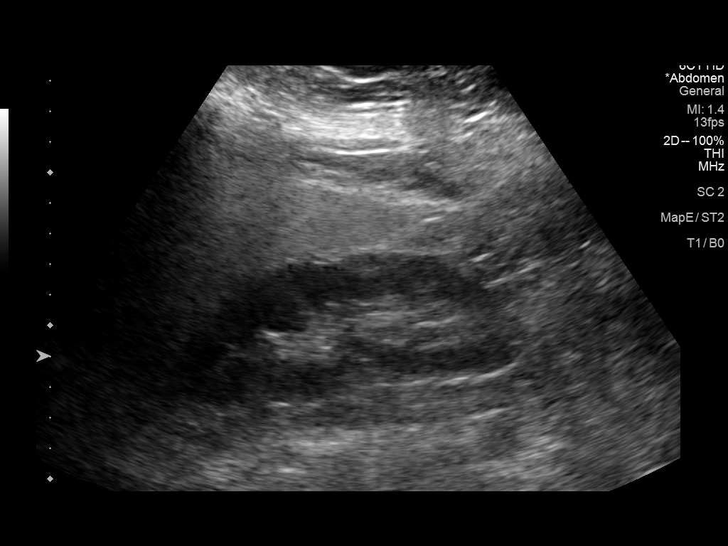
[im 66/88]
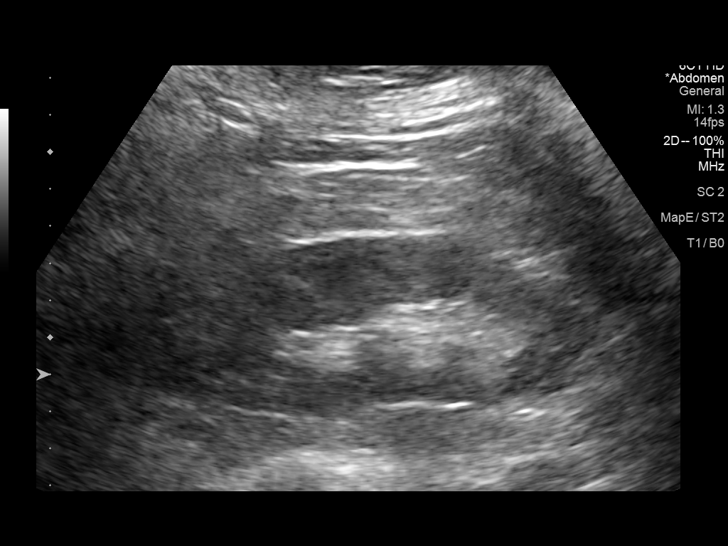
[im 73/88]
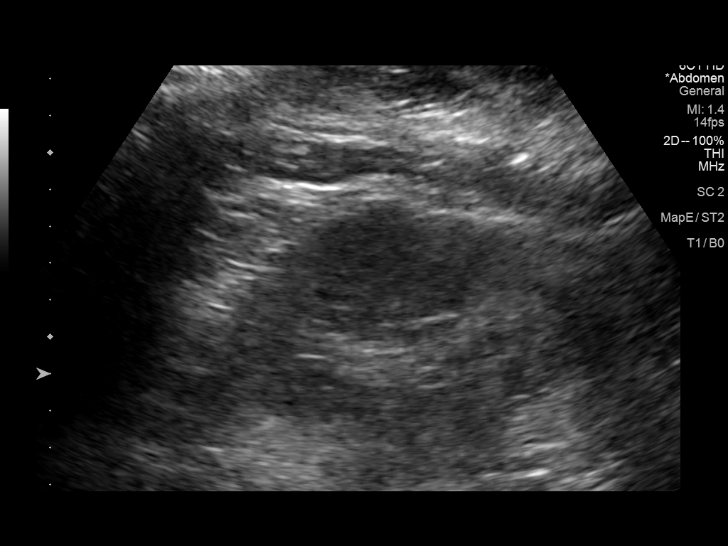
[im 80/88]
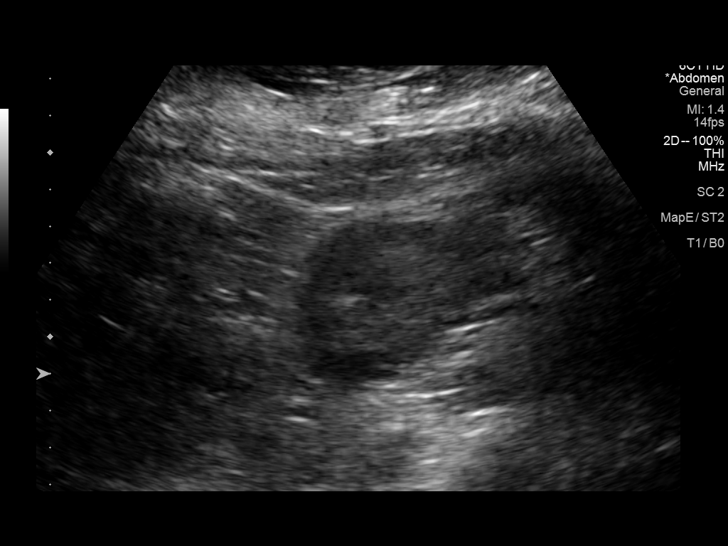
[im 88/88]
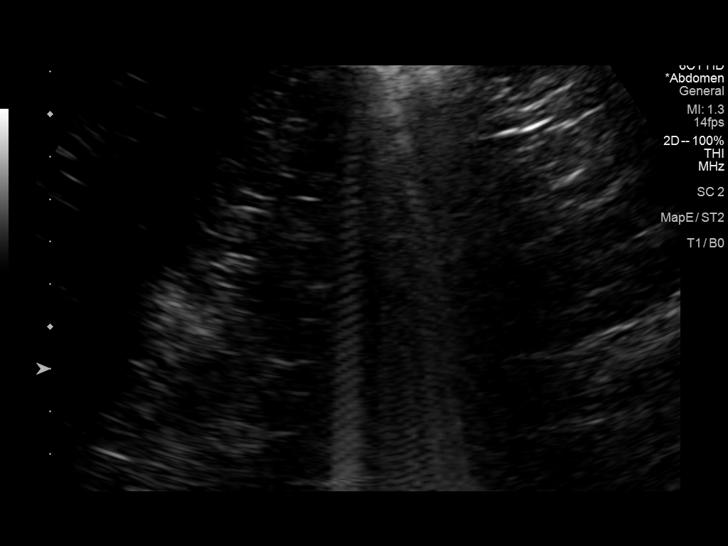

[14 of 25 positions shown; findings below may reference images not displayed]

FINDINGS: Gallbladder: No gallstones or wall thickening visualized. No
sonographic Murphy sign noted by sonographer.

Common bile duct: Diameter: 5 mm

Liver: There is diffuse increased liver echogenicity most commonly
seen in the setting of fatty infiltration. Superimposed inflammation
or fibrosis is not excluded. Clinical correlation is recommended.
Portal vein is patent on color Doppler imaging with normal direction
of blood flow towards the liver.

IVC: No abnormality visualized.

Pancreas: Visualized portion unremarkable.

Spleen: Size and appearance within normal limits.

Right Kidney: Length: 11.2 cm. Echogenicity within normal limits. No
mass or hydronephrosis visualized.

Left Kidney: Length: 12.2 cm. Echogenicity within normal limits. No
mass or hydronephrosis visualized.

Abdominal aorta: No aneurysm visualized.

Other findings: None.
IMPRESSION: Fatty liver, otherwise unremarkable abdominal ultrasound.

## 2021-12-05 ENCOUNTER — Telehealth (HOSPITAL_BASED_OUTPATIENT_CLINIC_OR_DEPARTMENT_OTHER): Payer: Self-pay | Admitting: Nurse Practitioner

## 2021-12-05 NOTE — Telephone Encounter (Signed)
Received a fax from [employer] for PA for Wegovy '1mg'$ /0.10m auto-injectors. Key: Laura [name]

## 2021-12-10 ENCOUNTER — Encounter (HOSPITAL_BASED_OUTPATIENT_CLINIC_OR_DEPARTMENT_OTHER): Payer: Self-pay | Admitting: Nurse Practitioner

## 2021-12-12 NOTE — Telephone Encounter (Signed)
PA recd via Fax  for wegovey Placed in the yellow box

## 2021-12-18 NOTE — Telephone Encounter (Signed)
Received another PA for [facility]

## 2022-01-23 ENCOUNTER — Encounter (HOSPITAL_BASED_OUTPATIENT_CLINIC_OR_DEPARTMENT_OTHER): Payer: Self-pay | Admitting: Nurse Practitioner

## 2022-01-23 ENCOUNTER — Ambulatory Visit (HOSPITAL_BASED_OUTPATIENT_CLINIC_OR_DEPARTMENT_OTHER): Payer: Managed Care, Other (non HMO) | Admitting: Nurse Practitioner

## 2022-01-23 VITALS — BP 109/60 | HR 97 | Ht 66.0 in | Wt 229.7 lb

## 2022-01-23 DIAGNOSIS — F411 Generalized anxiety disorder: Secondary | ICD-10-CM

## 2022-01-23 DIAGNOSIS — F330 Major depressive disorder, recurrent, mild: Secondary | ICD-10-CM | POA: Diagnosis not present

## 2022-01-23 MED ORDER — LORAZEPAM 0.5 MG PO TABS: 0.5000 mg | ORAL_TABLET | Freq: Three times a day (TID) | ORAL | 0 refills | Status: DC | PRN

## 2022-01-23 MED ORDER — ARIPIPRAZOLE 2 MG PO TABS: 2.0000 mg | ORAL_TABLET | Freq: Every day | ORAL | 2 refills | Status: DC

## 2022-01-23 NOTE — Patient Instructions (Signed)
I am so sorry for your loss. If you notice that the medication is not helping, please let me know and we can make changes.

## 2022-01-23 NOTE — Progress Notes (Signed)
Worthy Keeler, DNP, AGNP-c [address] [place], [place] [address] [phone] Office [phone] Fax  ESTABLISHED PATIENT- Chronic Health and/or Follow-Up Visit  Blood pressure 109/60, pulse 97, height '5\' 6"'$  (1.676 m), weight 229 lb 11.2 oz (104.2 kg), SpO2 97 %.    Laura [name] is a 36 y.o. year old female presenting today for evaluation and management of the following: Follow-up (Patient presents today with anxiety she lost her dog last week. )  Anxiety Laura [name] presents today with concerns with increased anxiety and tearfulness.  She tells me that she has been going through a significantly rough time lately as she recently lost her dog last week which has significantly affected her emotionally.  She tells me that she is anxious, tearful, sad, and not sleeping well.  She is currently on Lexapro and has been on Abilify in the past additionally.  She would like to know if this is an option to retry this medication again.  All ROS negative with exception of what is listed above.   PHYSICAL EXAM Physical Exam Vitals and nursing note reviewed.  Constitutional:      General: She is not in acute distress.    Appearance: Normal appearance.  HENT:     Head: Normocephalic.  Eyes:     Extraocular Movements: Extraocular movements intact.     Conjunctiva/sclera: Conjunctivae normal.     Pupils: Pupils are equal, round, and reactive to light.  Neck:     Vascular: No carotid bruit.  Cardiovascular:     Rate and Rhythm: Normal rate and regular rhythm.     Pulses: Normal pulses.     Heart sounds: Normal heart sounds. No murmur heard. Pulmonary:     Effort: Pulmonary effort is normal.     Breath sounds: Normal breath sounds. No wheezing.  Abdominal:     General: Bowel sounds are normal. There is no distension.     Palpations: Abdomen is soft.     Tenderness: There is no abdominal tenderness. There is no guarding.   Musculoskeletal:        General: Normal range of motion.     Cervical back: Normal range of motion and neck supple.     Right lower leg: No edema.     Left lower leg: No edema.  Lymphadenopathy:     Cervical: No cervical adenopathy.  Skin:    General: Skin is warm and dry.     Capillary Refill: Capillary refill takes less than 2 seconds.  Neurological:     General: No focal deficit present.     Mental Status: She is alert and oriented to person, place, and time.  Psychiatric:        Mood and Affect: Mood normal.        Behavior: Behavior normal.        Thought Content: Thought content normal.        Judgment: Judgment normal.     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  Previously she has been well controlled on Lexapro 20 mg/day.  In the past she has had success with Abilify.  Discussion with patient today and joint decision made to restart Abilify and continue Lexapro.  We will add as needed Xanax for panic symptoms as they present.  Patient aware to use this medication very sparingly.  No alarm symptoms are present at this time.  We will plan to follow-up closely and titrate medication as needed.      Relevant Medications   ARIPiprazole (ABILIFY) 2 MG tablet   LORazepam (ATIVAN) 0.5 MG tablet   escitalopram (LEXAPRO) 20 MG tablet   GAD (generalized anxiety disorder)    Current exacerbation of anxiety and depressive symptoms related to recent life changes including loss of pet.  Previously she is doing very well on Lexapro 20 mg.  At this time I do recommend we continue Lexapro.  In the past she has had good luck with addition of Abilify which I feel is completely reasonable to restart to help with her current symptoms.  We will plan to start Abilify and taper as needed. Will add prn xanax for panic symptoms  as needed.  Patient aware of warning signs that would warrant immediate repeat evaluation.  No alarm symptoms present at this time.      Relevant Medications   ARIPiprazole (ABILIFY) 2 MG tablet   LORazepam (ATIVAN) 0.5 MG tablet   escitalopram (LEXAPRO) 20 MG tablet    Return in about 4 weeks (around 02/20/2022) for Phone OK- follow-up mood.   Worthy Keeler, DNP, AGNP-c 01/23/2022  4:12 PM

## 2022-02-05 ENCOUNTER — Ambulatory Visit (HOSPITAL_BASED_OUTPATIENT_CLINIC_OR_DEPARTMENT_OTHER): Payer: Managed Care, Other (non HMO) | Admitting: Nurse Practitioner

## 2022-02-11 ENCOUNTER — Encounter (HOSPITAL_BASED_OUTPATIENT_CLINIC_OR_DEPARTMENT_OTHER): Payer: Self-pay | Admitting: Nurse Practitioner

## 2022-02-11 MED ORDER — ESCITALOPRAM OXALATE 20 MG PO TABS: ORAL_TABLET | ORAL | 3 refills | Status: DC

## 2022-02-11 NOTE — Assessment & Plan Note (Signed)
Exacerbation of anxiety and depressive symptoms related to life events and recent loss of pet.  Previously she has been well controlled on Lexapro 20 mg/day.  In the past she has had success with Abilify.  Discussion with patient today and joint decision made to restart Abilify and continue Lexapro.  We will add as needed Xanax for panic symptoms as they present.  Patient aware to use this medication very sparingly.  No alarm symptoms are present at this time.  We will plan to follow-up closely and titrate medication as needed.

## 2022-02-11 NOTE — Assessment & Plan Note (Addendum)
Current exacerbation of anxiety and depressive symptoms related to recent life changes including loss of pet.  Previously she is doing very well on Lexapro 20 mg.  At this time I do recommend we continue Lexapro.  In the past she has had good luck with addition of Abilify which I feel is completely reasonable to restart to help with her current symptoms.  We will plan to start Abilify and taper as needed. Will add prn xanax for panic symptoms as needed.  Patient aware of warning signs that would warrant immediate repeat evaluation.  No alarm symptoms present at this time.

## 2022-02-20 ENCOUNTER — Encounter (HOSPITAL_BASED_OUTPATIENT_CLINIC_OR_DEPARTMENT_OTHER): Payer: Self-pay | Admitting: Nurse Practitioner

## 2022-02-20 ENCOUNTER — Ambulatory Visit (INDEPENDENT_AMBULATORY_CARE_PROVIDER_SITE_OTHER): Payer: Managed Care, Other (non HMO) | Admitting: Nurse Practitioner

## 2022-02-20 DIAGNOSIS — F330 Major depressive disorder, recurrent, mild: Secondary | ICD-10-CM | POA: Diagnosis not present

## 2022-02-20 NOTE — Assessment & Plan Note (Signed)
Significant improvement of mood with addition of abilify and restarting counseling. Laura [name] would like to taper back off of the abilify as she feels that counseling will be enough at this time. She has stopped this dose without taper in the past without any negative effects. We discussed the long half life of the medication and the chance that symptoms won't start until day 3 without medication. She will watch for any symptoms and if these develop she will restart with every other day dosing for 4-5 days then every 3rd day dosing for 4-5 days then stop. She will follow-up if any new concerns present.

## 2022-02-20 NOTE — Progress Notes (Signed)
Virtual Visit Encounter telephone visit.   I connected with  Laura [name] on 02/20/22 at  3:10 PM EST by secure audio telemedicine application. I verified that I am speaking with the correct person using two identifiers.   I introduced myself as a Designer, jewellery with the practice. The limitations of evaluation and management by telemedicine discussed with the patient and the availability of in person appointments. The patient expressed verbal understanding and consent to proceed.  Participating parties in this visit include: Myself and patient  The patient is: Patient Location: Home I am: Provider Location: Office/Clinic Subjective:    CC and HPI: Laura [name] is a 36 y.o. year old female presenting for follow up of mood. Patient reports the following: Depression Laura [name] tells me that she is feeling much better emotionally since restarting her dose of abilify. She has also started seeing her counselor again and she feels this has been very effective with helping her mood. She would like to try to taper off of abilify, if possible. She has stopped this medication in the past without issue and without need for long taper.   Past medical history, Surgical history, Family history not pertinant except as noted below, Social history, Allergies, and medications have been entered into the medical record, reviewed, and corrections made.   Review of Systems:  All review of systems negative except what is listed in the HPI  Objective:    Alert and oriented x 4 Speaking in clear sentences with no shortness of breath. No distress.  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 Laura [name] would like to taper back off of the abilify as she feels that counseling will be enough at this time. She  has stopped this dose without taper in the past without any negative effects. We discussed the long half life of the medication and the chance that symptoms won't start until day 3 without medication. She will watch for any symptoms and if these develop she will restart with every other day dosing for 4-5 days then every 3rd day dosing for 4-5 days then stop. She will follow-up if any new concerns present.        orders and follow up as documented in EMR I discussed the assessment and treatment plan with the patient. The patient was provided an opportunity to ask questions and all were answered. The patient agreed with the plan and demonstrated an understanding of the instructions.   The patient was advised to call back or seek an in-person evaluation if the symptoms worsen or if the condition fails to improve as anticipated.  Follow-Up: prn  I provided 13 minutes of non-face-to-face interaction with this non face-to-face encounter including intake, same-day documentation, and chart review.   Orma Render, NP , DNP, AGNP-c [place] [facility] [phone] (fax)

## 2022-02-20 NOTE — Patient Instructions (Signed)
I am so glad you are feeling better. You can stop the abilify immediately, but if you notice any symptoms of headaches, nausea, or increased anxiety after 3-4 days then go ahead and take a dose of the medication and plan to taper by taking 1 dose every other day for 4-5 days then 1 dose every 3 days for 4-5 days then stop.   If you feel like your symptoms return or worsen while stopping the medication, you can restart and let me know and I will send in refills for you.

## 2022-03-18 ENCOUNTER — Telehealth: Payer: Self-pay | Admitting: Hematology and Oncology

## 2022-03-18 NOTE — Telephone Encounter (Signed)
Called patient to r/s upcoming appointment. Patient r/s and notified.

## 2022-04-08 ENCOUNTER — Other Ambulatory Visit: Payer: Managed Care, Other (non HMO)

## 2022-04-10 ENCOUNTER — Ambulatory Visit: Payer: Managed Care, Other (non HMO) | Admitting: Hematology and Oncology

## 2022-04-15 ENCOUNTER — Inpatient Hospital Stay: Payer: Managed Care, Other (non HMO) | Attending: Hematology and Oncology

## 2022-04-15 DIAGNOSIS — Z880 Allergy status to penicillin: Secondary | ICD-10-CM | POA: Diagnosis not present

## 2022-04-15 DIAGNOSIS — D500 Iron deficiency anemia secondary to blood loss (chronic): Secondary | ICD-10-CM

## 2022-04-15 DIAGNOSIS — E611 Iron deficiency: Secondary | ICD-10-CM | POA: Insufficient documentation

## 2022-04-15 DIAGNOSIS — F419 Anxiety disorder, unspecified: Secondary | ICD-10-CM | POA: Diagnosis not present

## 2022-04-15 DIAGNOSIS — Z79899 Other long term (current) drug therapy: Secondary | ICD-10-CM | POA: Insufficient documentation

## 2022-04-15 DIAGNOSIS — Z8261 Family history of arthritis: Secondary | ICD-10-CM | POA: Diagnosis not present

## 2022-04-15 DIAGNOSIS — M069 Rheumatoid arthritis, unspecified: Secondary | ICD-10-CM | POA: Diagnosis not present

## 2022-04-15 DIAGNOSIS — Z809 Family history of malignant neoplasm, unspecified: Secondary | ICD-10-CM | POA: Insufficient documentation

## 2022-04-15 DIAGNOSIS — F32A Depression, unspecified: Secondary | ICD-10-CM | POA: Diagnosis not present

## 2022-04-15 DIAGNOSIS — Z9049 Acquired absence of other specified parts of digestive tract: Secondary | ICD-10-CM | POA: Insufficient documentation

## 2022-04-15 DIAGNOSIS — Z800 Family history of malignant neoplasm of digestive organs: Secondary | ICD-10-CM | POA: Insufficient documentation

## 2022-04-15 DIAGNOSIS — Z87440 Personal history of urinary (tract) infections: Secondary | ICD-10-CM | POA: Diagnosis not present

## 2022-04-15 LAB — CBC WITH DIFFERENTIAL/PLATELET
Abs Immature Granulocytes: 0.02 10*3/uL (ref 0.00–0.07)
Basophils Absolute: 0.1 10*3/uL (ref 0.0–0.1)
Basophils Relative: 1 %
Eosinophils Absolute: 0.1 10*3/uL (ref 0.0–0.5)
Eosinophils Relative: 2 %
HCT: 43.4 % (ref 36.0–46.0)
Hemoglobin: 14.1 g/dL (ref 12.0–15.0)
Immature Granulocytes: 0 %
Lymphocytes Relative: 27 %
Lymphs Abs: 2 10*3/uL (ref 0.7–4.0)
MCH: 27.3 pg (ref 26.0–34.0)
MCHC: 32.5 g/dL (ref 30.0–36.0)
MCV: 83.9 fL (ref 80.0–100.0)
Monocytes Absolute: 0.6 10*3/uL (ref 0.1–1.0)
Monocytes Relative: 8 %
Neutro Abs: 4.9 10*3/uL (ref 1.7–7.7)
Neutrophils Relative %: 62 %
Platelets: 295 10*3/uL (ref 150–400)
RBC: 5.17 MIL/uL — ABNORMAL HIGH (ref 3.87–5.11)
RDW: 12.5 % (ref 11.5–15.5)
WBC: 7.7 10*3/uL (ref 4.0–10.5)
nRBC: 0 % (ref 0.0–0.2)

## 2022-04-15 LAB — CMP (CANCER CENTER ONLY)
ALT: 13 U/L (ref 0–44)
AST: 13 U/L — ABNORMAL LOW (ref 15–41)
Albumin: 4.2 g/dL (ref 3.5–5.0)
Alkaline Phosphatase: 69 U/L (ref 38–126)
Anion gap: 6 (ref 5–15)
BUN: 12 mg/dL (ref 6–20)
CO2: 27 mmol/L (ref 22–32)
Calcium: 9.3 mg/dL (ref 8.9–10.3)
Chloride: 104 mmol/L (ref 98–111)
Creatinine: 0.85 mg/dL (ref 0.44–1.00)
GFR, Estimated: >60 mL/min (ref >60)
Glucose, Bld: 87 mg/dL (ref 70–99)
Potassium: 4.1 mmol/L (ref 3.5–5.1)
Sodium: 137 mmol/L (ref 135–145)
Total Bilirubin: 0.4 mg/dL (ref 0.3–1.2)
Total Protein: 7.4 g/dL (ref 6.5–8.1)

## 2022-04-15 LAB — IRON AND IRON BINDING CAPACITY (CC-WL,HP ONLY)
Iron: 71 ug/dL (ref 28–170)
Saturation Ratios: 15 % (ref 10.4–31.8)
TIBC: 479 ug/dL — ABNORMAL HIGH (ref 250–450)
UIBC: 408 ug/dL (ref 148–442)

## 2022-04-15 LAB — FERRITIN: Ferritin: 14 ng/mL (ref 11–307)

## 2022-04-17 ENCOUNTER — Encounter: Payer: Self-pay | Admitting: Hematology and Oncology

## 2022-04-17 ENCOUNTER — Other Ambulatory Visit: Payer: Self-pay

## 2022-04-17 ENCOUNTER — Inpatient Hospital Stay: Payer: Managed Care, Other (non HMO) | Admitting: Hematology and Oncology

## 2022-04-17 VITALS — BP 124/88 | HR 79 | Temp 98.8°F | Resp 16 | Ht 66.0 in | Wt 238.2 lb

## 2022-04-17 DIAGNOSIS — D500 Iron deficiency anemia secondary to blood loss (chronic): Secondary | ICD-10-CM | POA: Diagnosis not present

## 2022-04-17 DIAGNOSIS — E611 Iron deficiency: Secondary | ICD-10-CM | POA: Diagnosis not present

## 2022-04-17 NOTE — Progress Notes (Signed)
[place] NOTE  Patient Care Team: Early, Coralee Pesa, NP as PCP - General (Nurse Practitioner) Gavin Pound, MD as Consulting Physician (Rheumatology) Linda Hedges, DO as Consulting Physician (Obstetrics and Gynecology) Havery Moros, [name], MD as Consulting Physician (Gastroenterology)  CHIEF COMPLAINTS/PURPOSE OF CONSULTATION:  IDA  ASSESSMENT & PLAN:   No problem-specific Assessment & Plan notes found for this encounter.    No orders of the defined types were placed in this encounter.   Thank you for consulting [place] in the care of this patient please not hesitate to contact us with any questions or concerns.  HISTORY OF PRESENTING ILLNESS:   Laura [name] 37 y.o. female is here because of IDA.    This is a very pleasant 37 year old female patient who arrived to the appointment by herself for evaluation and recommendations for an iron deficiency anemia.    Interim History  She is here for follow up on IDA. She says she feels like her iron is low, she has noticed a bit more fatigue and hair loss.  She is working with the weight loss management clinic and has lost about 35 to 40 pounds, she is on semaglutide currently.  She has not noticed any pica.  Her menstrual cycles are more regular and not described as heavy.  She is not on any oral iron supplementation.  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  She works 3 days 12 hours a day and has been taking some photography classes in her free time.  Rest of the pertinent 10 point ROS reviewed and negative   MEDICAL HISTORY:  Past Medical History:  Diagnosis Date   Anemia    Anxiety    Anxiety 12/31/2018   Arthritis    Depression    Depression    Phreesia 02/25/2020   Depressive disorder 03/28/2014   Establishing care with new doctor, encounter for 07/30/2020   Flatulence, eructation and gas pain 07/30/2020   Frequent headaches    History of chicken pox    Otitis media    had tubes as  child, then according to pt adipose tissue used to repair ear drum   Rheumatoid arthritis ([place])    UTI (lower urinary tract infection)     SURGICAL HISTORY: Past Surgical History:  Procedure Laterality Date   APPENDECTOMY  2012   HYMENECTOMY  2000   TONSILECTOMY, ADENOIDECTOMY, BILATERAL MYRINGOTOMY AND TUBES      SOCIAL HISTORY: Social History   Socioeconomic History   Marital status: Single    Spouse name: Not on file   Number of children: 0   Years of education: Not on file   Highest education level: Not on file  Occupational History   Occupation: Teacher, music: [employer]  Tobacco Use   Smoking status: Never   Smokeless tobacco: Never  Vaping Use   Vaping Use: Never used  Substance and Sexual Activity   Alcohol use: Yes    Alcohol/week: 0.0 standard drinks of alcohol    Comment: occasionally    Drug use: No   Sexual activity: Not Currently    Birth control/protection: Pill  Other Topics Concern   Not on file  Social History Narrative   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       Social Determinants of Health  Financial Resource Strain: Not on file  Food Insecurity: Not on file  Transportation Needs: Not on file  Physical Activity: Not on file  Stress: Not on file  Social Connections: Not on file  Intimate Partner Violence: Not on file    FAMILY HISTORY: Family History  Problem Relation Age of Onset   Arthritis Paternal Grandmother    Cancer Maternal Grandmother    Cancer Maternal Grandfather    Liver cancer Paternal Grandfather     ALLERGIES:  is allergic to kenalog [triamcinolone acetonide] and amoxicillin.  MEDICATIONS:  Current Outpatient Medications  Medication Sig Dispense Refill   cholecalciferol (VITAMIN D3) 25 MCG (1000 UNIT) tablet Take 1,000 Units by mouth daily.      cyclobenzaprine (FLEXERIL) 10 MG tablet Take 0.5-1 tablets (5-10 mg total) by mouth at bedtime. Caution: can cause drowsiness 30 tablet 1   escitalopram (LEXAPRO) 20 MG tablet TAKE 1 TABLET(20 MG) BY MOUTH AT BEDTIME 90 tablet 3   HUMIRA PEN 40 MG/0.4ML PNKT SMARTSIG:40 Milligram(s) SUB-Q Every 2 Weeks     hydroxychloroquine (PLAQUENIL) 200 MG tablet Take 1 tablet (200 mg total) by mouth 2 (two) times daily. 60 tablet 0   LORazepam (ATIVAN) 0.5 MG tablet Take 1 tablet (0.5 mg total) by mouth every 8 (eight) hours as needed for anxiety. 20 tablet 0   ondansetron (ZOFRAN) 8 MG tablet Take 1 tablet (8 mg total) by mouth every 8 (eight) hours as needed for nausea. 30 tablet 2   Semaglutide-Weight Management (WEGOVY) 1 MG/0.5ML SOAJ Inject 1 mg into the skin once a week. 6 mL 2   Vitamin D, Ergocalciferol, (DRISDOL) 1.25 MG (50000 UNIT) CAPS capsule Take 1 capsule (50,000 Units total) by mouth every 7 (seven) days. Take for 8 total doses(weeks) 8 capsule 0   No current facility-administered medications for this visit.     PHYSICAL EXAMINATION: ECOG PERFORMANCE STATUS: 1 - Symptomatic but completely ambulatory  Vitals:   04/17/22 1331  BP: 124/88  Pulse: 79  Resp: 16  Temp: 98.8 F (37.1 C)  SpO2: 98%     Filed Weights   04/17/22 1331  Weight: 238 lb 3.2 oz (108 kg)   GENERAL:alert, no distress and comfortable, obese. SKIN: skin color, texture, turgor are normal, no rashes or significant lesions EYES: normal, mild pallor, sclera clear OROPHARYNX:no exudate, no erythema and lips, buccal mucosa, and tongue normal  NECK: supple, thyroid normal size, non-tender, without nodularity LYMPH:  no palpable lymphadenopathy in the cervical, axillary LUNGS: clear to auscultation and percussion with normal breathing effort HEART: regular rate & rhythm and no murmurs and no lower extremity edema ABDOMEN:abdomen soft, non-tender and normal bowel sounds Musculoskeletal:no cyanosis of digits and no  clubbing  PSYCH: alert & oriented x 3 with fluent speech NEURO: no focal motor/sensory deficits  LABORATORY DATA:  I have reviewed the data as listed Lab Results  Component Value Date   WBC 7.7 04/15/2022   HGB 14.1 04/15/2022   HCT 43.4 04/15/2022   MCV 83.9 04/15/2022   PLT 295 04/15/2022     Chemistry      Component Value Date/Time   NA 137 04/15/2022 1330   NA 144 08/06/2021 1411   K 4.1 04/15/2022 1330   CL 104 04/15/2022 1330   CO2 27 04/15/2022 1330   BUN 12 04/15/2022 1330   BUN 8 08/06/2021 1411   CREATININE 0.85 04/15/2022 1330   CREATININE 0.80 04/29/2012 1724      Component Value Date/Time   CALCIUM  9.3 04/15/2022 1330   ALKPHOS 69 04/15/2022 1330   AST 13 (L) 04/15/2022 1330   ALT 13 04/15/2022 1330   BILITOT 0.4 04/15/2022 1330      Labs from today showed hemoglobin of 14.9, normal white blood cell count and platelet count Iron and iron binding panel showed a TIBC of 452, ferritin in process  RADIOGRAPHIC STUDIES: I have personally reviewed the radiological images as listed and agreed with the findings in the report. No results found.  All questions were answered. The patient knows to call the clinic with any problems, questions or concerns. I spent a total of 20 minutes in the care of this patient including history and physical, review of records, counseling and coordination of care.     Benay Pike, MD 04/17/2022 1:35 PM

## 2022-04-17 NOTE — Progress Notes (Signed)
[facility] NOTE  Patient Care Team: Early, Coralee Pesa, NP as PCP - General (Nurse Practitioner) Gavin Pound, MD as Consulting Physician (Rheumatology) Linda Hedges, DO as Consulting Physician (Obstetrics and Gynecology) Havery Moros, [name], MD as Consulting Physician (Gastroenterology)  CHIEF COMPLAINTS/PURPOSE OF CONSULTATION:  IDA  ASSESSMENT & PLAN:  This is a pleasant 37 year old [name] patient with rheumatoid arthritis on hydroxychloroquine referred to hematology for evaluation and recommendations regarding iron deficiency.  She last received iron infusion in June 2022.  She today does not report any new symptoms.  Physical examination today unremarkable.  We have reviewed her labs which shows normal hemoglobin however low normal ferritin and elevated total iron binding capacity.  Hence have encouraged her to start taking oral iron supplementation at least twice a week and come back to see [place] in about 6 months.  She is agreeable to this approach.  Thank you for consulting [place] in the care of this patient please not hesitate to contact us with any questions or concerns.  HISTORY OF PRESENTING ILLNESS:   Laura [name] 37 y.o. [name] is here because of IDA.    This is a very pleasant 37 year old [name] patient who arrived to the appointment by herself for evaluation and recommendations for an iron deficiency anemia.    Interim History  She is here for follow up on IDA.  She feels pretty much the same as her last visit.  No overt fatigue, shortness of breath on exertion, dizziness or pica.  Menstrual cycles are regular and not heavy.  She has not been taking any oral iron supplementation but is willing to.  Rest of the pertinent 10 point ROS reviewed and negative  MEDICAL HISTORY:  Past Medical History:  Diagnosis Date   Anemia    Anxiety    Anxiety 12/31/2018   Arthritis    Depression    Depression    Phreesia 02/25/2020   Depressive disorder  03/28/2014   Establishing care with new doctor, encounter for 07/30/2020   Flatulence, eructation and gas pain 07/30/2020   Frequent headaches    History of chicken pox    Otitis media    had tubes as child, then according to pt adipose tissue used to repair ear drum   Rheumatoid arthritis ([place])    UTI (lower urinary tract infection)     SURGICAL HISTORY: Past Surgical History:  Procedure Laterality Date   APPENDECTOMY  2012   HYMENECTOMY  2000   TONSILECTOMY, ADENOIDECTOMY, BILATERAL MYRINGOTOMY AND TUBES      SOCIAL HISTORY: Social History   Socioeconomic History   Marital status: Single    Spouse name: Not on file   Number of children: 0   Years of education: Not on file   Highest education level: Not on file  Occupational History   Occupation: Teacher, music: [employer]  Tobacco Use   Smoking status: Never   Smokeless tobacco: Never  Vaping Use   Vaping Use: Never used  Substance and Sexual Activity   Alcohol use: Yes    Alcohol/week: 0.0 standard drinks of alcohol    Comment: occasionally    Drug use: No   Sexual activity: Not Currently    Birth control/protection: Pill  Other Topics Concern   Not on file  Social History Narrative   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       Social Determinants of Health   Financial Resource Strain: Not on file  Food Insecurity: Not on file  Transportation Needs: Not on file  Physical Activity: Not on file  Stress: Not on file  Social Connections: Not on file  Intimate Partner Violence: Not on file    FAMILY HISTORY: Family History  Problem Relation Age of Onset   Arthritis Paternal Grandmother    Cancer Maternal Grandmother    Cancer Maternal Grandfather    Liver cancer Paternal Grandfather     ALLERGIES:  is allergic to kenalog [triamcinolone  acetonide] and amoxicillin.  MEDICATIONS:  Current Outpatient Medications  Medication Sig Dispense Refill   cholecalciferol (VITAMIN D3) 25 MCG (1000 UNIT) tablet Take 1,000 Units by mouth daily.     cyclobenzaprine (FLEXERIL) 10 MG tablet Take 0.5-1 tablets (5-10 mg total) by mouth at bedtime. Caution: can cause drowsiness 30 tablet 1   escitalopram (LEXAPRO) 20 MG tablet TAKE 1 TABLET(20 MG) BY MOUTH AT BEDTIME 90 tablet 3   HUMIRA PEN 40 MG/0.4ML PNKT SMARTSIG:40 Milligram(s) SUB-Q Every 2 Weeks     hydroxychloroquine (PLAQUENIL) 200 MG tablet Take 1 tablet (200 mg total) by mouth 2 (two) times daily. 60 tablet 0   LORazepam (ATIVAN) 0.5 MG tablet Take 1 tablet (0.5 mg total) by mouth every 8 (eight) hours as needed for anxiety. 20 tablet 0   ondansetron (ZOFRAN) 8 MG tablet Take 1 tablet (8 mg total) by mouth every 8 (eight) hours as needed for nausea. 30 tablet 2   Semaglutide-Weight Management (WEGOVY) 1 MG/0.5ML SOAJ Inject 1 mg into the skin once a week. 6 mL 2   Vitamin D, Ergocalciferol, (DRISDOL) 1.25 MG (50000 UNIT) CAPS capsule Take 1 capsule (50,000 Units total) by mouth every 7 (seven) days. Take for 8 total doses(weeks) 8 capsule 0   No current facility-administered medications for this visit.     PHYSICAL EXAMINATION: ECOG PERFORMANCE STATUS: 1 - Symptomatic but completely ambulatory  Vitals:   04/17/22 1331  BP: 124/88  Pulse: 79  Resp: 16  Temp: 98.8 F (37.1 C)  SpO2: 98%     Filed Weights   04/17/22 1331  Weight: 238 lb 3.2 oz (108 kg)   GENERAL:alert, no distress and comfortable, obese. Neck: No palpable regional adenopathy. Chest: Clear to auscultation bilaterally Heart: Rate and rhythm regular Abdomen: Soft, nondistended, nontender No lower extremity edema  LABORATORY DATA:  I have reviewed the data as listed Lab Results  Component Value Date   WBC 7.7 04/15/2022   HGB 14.1 04/15/2022   HCT 43.4 04/15/2022   MCV 83.9 04/15/2022   PLT 295  04/15/2022     Chemistry      Component Value Date/Time   NA 137 04/15/2022 1330   NA 144 08/06/2021 1411   K 4.1 04/15/2022 1330   CL 104 04/15/2022 1330   CO2 27 04/15/2022 1330   BUN 12 04/15/2022 1330   BUN 8 08/06/2021 1411   CREATININE 0.85 04/15/2022 1330   CREATININE 0.80 04/29/2012 1724      Component Value Date/Time   CALCIUM 9.3 04/15/2022 1330   ALKPHOS 69 04/15/2022 1330   AST 13 (L) 04/15/2022 1330   ALT 13 04/15/2022 1330   BILITOT 0.4 04/15/2022 1330      Labs from today reviewed, hemoglobin normal, however ferritin low normal and total iron binding capacity is slowly increasing which suggests low-level iron deficiency.  RADIOGRAPHIC  STUDIES: I have personally reviewed the radiological images as listed and agreed with the findings in the report. No results found.  All questions were answered. The patient knows to call the clinic with any problems, questions or concerns. I spent a total of 20 minutes in the care of this patient including history and physical, review of records, counseling and coordination of care.     Benay Pike, MD 04/17/2022 1:33 PM

## 2022-06-28 ENCOUNTER — Other Ambulatory Visit (HOSPITAL_BASED_OUTPATIENT_CLINIC_OR_DEPARTMENT_OTHER): Payer: Self-pay | Admitting: Nurse Practitioner

## 2022-06-28 DIAGNOSIS — E282 Polycystic ovarian syndrome: Secondary | ICD-10-CM

## 2022-06-28 DIAGNOSIS — Z6841 Body Mass Index (BMI) 40.0 and over, adult: Secondary | ICD-10-CM

## 2022-06-28 DIAGNOSIS — R7303 Prediabetes: Secondary | ICD-10-CM

## 2022-06-28 DIAGNOSIS — E782 Mixed hyperlipidemia: Secondary | ICD-10-CM

## 2022-06-28 DIAGNOSIS — K760 Fatty (change of) liver, not elsewhere classified: Secondary | ICD-10-CM

## 2022-07-22 ENCOUNTER — Encounter: Payer: Self-pay | Admitting: Nurse Practitioner

## 2022-07-22 ENCOUNTER — Ambulatory Visit: Payer: Managed Care, Other (non HMO) | Admitting: Nurse Practitioner

## 2022-07-22 VITALS — BP 128/80 | Wt 250.8 lb

## 2022-07-22 DIAGNOSIS — Z6841 Body Mass Index (BMI) 40.0 and over, adult: Secondary | ICD-10-CM

## 2022-07-22 DIAGNOSIS — F330 Major depressive disorder, recurrent, mild: Secondary | ICD-10-CM

## 2022-07-22 DIAGNOSIS — R7303 Prediabetes: Secondary | ICD-10-CM | POA: Diagnosis not present

## 2022-07-22 DIAGNOSIS — E782 Mixed hyperlipidemia: Secondary | ICD-10-CM

## 2022-07-22 DIAGNOSIS — F411 Generalized anxiety disorder: Secondary | ICD-10-CM

## 2022-07-22 DIAGNOSIS — K760 Fatty (change of) liver, not elsewhere classified: Secondary | ICD-10-CM

## 2022-07-22 DIAGNOSIS — E282 Polycystic ovarian syndrome: Secondary | ICD-10-CM

## 2022-07-22 LAB — VITAMIN D 25 HYDROXY (VIT D DEFICIENCY, FRACTURES)

## 2022-07-22 MED ORDER — ESCITALOPRAM OXALATE 20 MG PO TABS
ORAL_TABLET | ORAL | 3 refills | Status: DC
Start: 2022-07-22 — End: 2023-07-06

## 2022-07-22 MED ORDER — WEGOVY 0.5 MG/0.5ML ~~LOC~~ SOAJ
0.5000 mg | SUBCUTANEOUS | 0 refills | Status: DC
Start: 2022-07-22 — End: 2022-12-12

## 2022-07-22 MED ORDER — WEGOVY 1.7 MG/0.75ML ~~LOC~~ SOAJ
1.7000 mg | SUBCUTANEOUS | 0 refills | Status: DC
Start: 2022-07-22 — End: 2022-11-05

## 2022-07-22 MED ORDER — ONDANSETRON HCL 8 MG PO TABS
8.0000 mg | ORAL_TABLET | Freq: Three times a day (TID) | ORAL | 2 refills | Status: DC | PRN
Start: 2022-07-22 — End: 2023-03-26

## 2022-07-22 MED ORDER — WEGOVY 1 MG/0.5ML ~~LOC~~ SOAJ
1.0000 mg | SUBCUTANEOUS | 0 refills | Status: DC
Start: 2022-07-22 — End: 2022-11-05

## 2022-07-22 NOTE — Assessment & Plan Note (Signed)
Patient reports feeling down due to work stress and pet health issues. She is currently on lexapro with PRN ativan, but she is rarely taking this. She is managing the situation well and does not feel that medication needs to be changed at this time. I have encouraged her to reach out if she does not feel her mood lifts in the next few weeks.  Plan: - Encourage patient to monitor mood and reach out if symptoms worsen or additional support is needed. - Refill provided for lexapro today - OK to refill ativan if needed

## 2022-07-22 NOTE — Patient Instructions (Signed)
Start back on the mediation at the 0.25mg  for two weeks then increase to the 0.5mg  for the remainder of the sample. I have sent in 0.5mg , 1mg , and 1.7mg  doses for you. Please let me know if any of the doses seem to be too much and we can bump back down.

## 2022-07-22 NOTE — Progress Notes (Signed)
Laura Clamp, DNP, AGNP-c [facility]  [address] [place], [place] [address] [phone]  ESTABLISHED PATIENT- Chronic Health and/or Follow-Up Visit  Blood pressure 128/80, weight 250 lb 12.8 oz (113.8 kg).    Laura [name] is a 37 y.o. year old female presenting today for evaluation and management of chronic conditions: weight management, depression/anxiety, and vitamin D deficiency.   Laura [name] reports feeling down due to situational stressors at work and the recent health concerns with her dog. She has been working with treatment options for her dog's cancer, which has been challenging to manage. She feels like her current medication regimen is effective and does not feel that an increased dose is needed. She has had challenges getting her Lexapro refilled recently. Laura [name] mentions she has a small supply of Ativan, which she rarely uses.  Laura [name] is currently taking Humira and Plaquenil for her medical conditions. She mentions that she has not been taking Wegovy for several months due to a denial in the refill process. She stopped while dealing with the health concerns of her dog and has recently tried to restart again.  She is starting weight training with a personal trainer at a private gym today. She has gained some weight back since stopping the [facility], but not all of her weight.   Regarding her energy levels, Laura [name] feels they are somewhat low, attributing this to her mood and low iron levels. She is open to having an A1C and vitamin D check today. Her iron levels were monitoring in January.    All ROS negative with exception of what is listed above.   PHYSICAL EXAM Physical Exam Vitals and nursing note reviewed.  Constitutional:      Appearance: Normal appearance. She is obese.  HENT:     Head: Normocephalic.  Eyes:     Pupils: Pupils are equal, round, and reactive to light.  Neck:     Vascular: No carotid bruit.  Cardiovascular:     Rate  and Rhythm: Normal rate and regular rhythm.     Pulses: Normal pulses.  Pulmonary:     Effort: Pulmonary effort is normal.     Breath sounds: Normal breath sounds.  Abdominal:     General: Bowel sounds are normal. There is no distension.     Palpations: Abdomen is soft.     Tenderness: There is no abdominal tenderness. There is no guarding.  Musculoskeletal:        General: Normal range of motion.     Cervical back: Normal range of motion.     Right lower leg: No edema.     Left lower leg: No edema.  Lymphadenopathy:     Cervical: No cervical adenopathy.  Skin:    General: Skin is warm and dry.     Capillary Refill: Capillary refill takes less than 2 seconds.  Neurological:     General: No focal deficit present.     Mental Status: She is alert and oriented to person, place, and time.  Psychiatric:        Mood and Affect: Mood normal.        Behavior: Behavior normal.     PLAN Problem List Items Addressed This Visit     Obesity - Primary    Depression and Situational Stress Patient reports feeling down due to work stress and pet health issues. She is currently on lexapro with PRN ativan, but she is rarely taking this. She is managing the situation well and does not feel that  medication needs to be changed at this time. I have encouraged her to reach out if she does not feel her mood lifts in the next few weeks.  Plan: - Encourage patient to monitor mood and reach out if symptoms worsen or additional support is needed. - Refill provided for lexapro today - OK to refill ativan if needed  Weight Management and History of Wegovy Use Patient has had previous success with weight management on Wegovy. She did stop the medication a few months ago and has been able to maintain some of the weight loss, but not all of it. She is interested in restarting this medication to help with weight management and craving control. She does have a history positive for fatty liver, pre-dm, and high  cholesterol which are all an increased risk in the setting of BMI >26. She is actively working with a weight trainer and following a healthy diet plan.  Plan: - Start patient on sample 0.25 mg for two weeks, then increase to 0.5 mg for two weeks. - Work on prior authorization for [employer] during this time. - 3 months supply has been requested with 1 month 0.5mg , 1 month 1mg , and 1 month 1.7mg .        Relevant Medications   Semaglutide-Weight Management (WEGOVY) 0.5 MG/0.5ML SOAJ   Semaglutide-Weight Management (WEGOVY) 1 MG/0.5ML SOAJ   Semaglutide-Weight Management (WEGOVY) 1.7 MG/0.75ML SOAJ   ondansetron (ZOFRAN) 8 MG tablet   Other Relevant Orders   Hemoglobin A1c   VITAMIN D 25 Hydroxy (Vit-D Deficiency, Fractures)   Mild episode of recurrent major depressive disorder    Patient reports feeling down due to work stress and pet health issues. She is currently on lexapro with PRN ativan, but she is rarely taking this. She is managing the situation well and does not feel that medication needs to be changed at this time. I have encouraged her to reach out if she does not feel her mood lifts in the next few weeks.  Plan: - Encourage patient to monitor mood and reach out if symptoms worsen or additional support is needed. - Refill provided for lexapro today - OK to refill ativan if needed      Relevant Medications   escitalopram (LEXAPRO) 20 MG tablet   GAD (generalized anxiety disorder)    Patient reports feeling down due to work stress and pet health issues. She is currently on lexapro with PRN ativan, but she is rarely taking this. She is managing the situation well and does not feel that medication needs to be changed at this time. I have encouraged her to reach out if she does not feel her mood lifts in the next few weeks.  Plan: - Encourage patient to monitor mood and reach out if symptoms worsen or additional support is needed. - Refill provided for lexapro today - OK to refill  ativan if needed      Relevant Medications   escitalopram (LEXAPRO) 20 MG tablet   Prediabetes   Relevant Medications   Semaglutide-Weight Management (WEGOVY) 0.5 MG/0.5ML SOAJ   Semaglutide-Weight Management (WEGOVY) 1 MG/0.5ML SOAJ   Semaglutide-Weight Management (WEGOVY) 1.7 MG/0.75ML SOAJ   ondansetron (ZOFRAN) 8 MG tablet   Other Relevant Orders   Hemoglobin A1c   VITAMIN D 25 Hydroxy (Vit-D Deficiency, Fractures)   Polycystic ovary syndrome   Relevant Medications   ondansetron (ZOFRAN) 8 MG tablet   Hepatic steatosis   Relevant Medications   Semaglutide-Weight Management (WEGOVY) 0.5 MG/0.5ML SOAJ   Semaglutide-Weight Management (  WEGOVY) 1 MG/0.5ML SOAJ   Semaglutide-Weight Management (WEGOVY) 1.7 MG/0.75ML SOAJ   ondansetron (ZOFRAN) 8 MG tablet   Other Relevant Orders   Hemoglobin A1c   VITAMIN D 25 Hydroxy (Vit-D Deficiency, Fractures)   Moderate mixed hyperlipidemia not requiring statin therapy   Relevant Medications   Semaglutide-Weight Management (WEGOVY) 0.5 MG/0.5ML SOAJ   Semaglutide-Weight Management (WEGOVY) 1 MG/0.5ML SOAJ   Semaglutide-Weight Management (WEGOVY) 1.7 MG/0.75ML SOAJ   ondansetron (ZOFRAN) 8 MG tablet   Other Relevant Orders   Hemoglobin A1c   VITAMIN D 25 Hydroxy (Vit-D Deficiency, Fractures)   Other Visit Diagnoses     BMI 40.0-44.9, adult       Relevant Medications   Semaglutide-Weight Management (WEGOVY) 0.5 MG/0.5ML SOAJ   Semaglutide-Weight Management (WEGOVY) 1 MG/0.5ML SOAJ   Semaglutide-Weight Management (WEGOVY) 1.7 MG/0.75ML SOAJ   ondansetron (ZOFRAN) 8 MG tablet       Return in about 6 months (around 01/21/2023) for Med management.   Laura ClampSaraBeth Jayleen Scaglione, DNP, AGNP-c 07/22/2022  9:47 AM

## 2022-07-22 NOTE — Assessment & Plan Note (Signed)
Patient reports feeling down due to work stress and pet health issues. She is currently on lexapro with PRN ativan, but she is rarely taking this. She is managing the situation well and does not feel that medication needs to be changed at this time. I have encouraged her to reach out if she does not feel her mood lifts in the next few weeks.  Plan: - Encourage patient to monitor mood and reach out if symptoms worsen or additional support is needed. - Refill provided for lexapro today - OK to refill ativan if needed 

## 2022-07-22 NOTE — Assessment & Plan Note (Signed)
Depression and Situational Stress Patient reports feeling down due to work stress and pet health issues. She is currently on lexapro with PRN ativan, but she is rarely taking this. She is managing the situation well and does not feel that medication needs to be changed at this time. I have encouraged her to reach out if she does not feel her mood lifts in the next few weeks.  Plan: - Encourage patient to monitor mood and reach out if symptoms worsen or additional support is needed. - Refill provided for lexapro today - OK to refill ativan if needed  Weight Management and History of Wegovy Use Patient has had previous success with weight management on Wegovy. She did stop the medication a few months ago and has been able to maintain some of the weight loss, but not all of it. She is interested in restarting this medication to help with weight management and craving control. She does have a history positive for fatty liver, pre-dm, and high cholesterol which are all an increased risk in the setting of BMI >26. She is actively working with a weight trainer and following a healthy diet plan.  Plan: - Start patient on sample 0.25 mg for two weeks, then increase to 0.5 mg for two weeks. - Work on prior authorization for [employer] during this time. - 3 months supply has been requested with 1 month 0.5mg , 1 month 1mg , and 1 month 1.7mg .

## 2022-07-23 ENCOUNTER — Other Ambulatory Visit: Payer: Self-pay | Admitting: Nurse Practitioner

## 2022-07-23 DIAGNOSIS — E559 Vitamin D deficiency, unspecified: Secondary | ICD-10-CM

## 2022-07-23 LAB — HEMOGLOBIN A1C
Est. average glucose Bld gHb Est-mCnc: 117 mg/dL
Hgb A1c MFr Bld: 5.7 % — ABNORMAL HIGH (ref 4.8–5.6)

## 2022-07-23 MED ORDER — VITAMIN D3 1.25 MG (50000 UT) PO TABS
1.0000 | ORAL_TABLET | ORAL | 1 refills | Status: DC
Start: 2022-07-23 — End: 2022-11-07

## 2022-07-30 ENCOUNTER — Telehealth: Payer: Self-pay

## 2022-07-30 NOTE — Telephone Encounter (Signed)
[place].

## 2022-07-31 ENCOUNTER — Telehealth: Payer: Self-pay | Admitting: Family Medicine

## 2022-07-31 NOTE — Telephone Encounter (Signed)
2 strengths of [facility] sent in to Cover my meds

## 2022-08-04 ENCOUNTER — Telehealth: Payer: Self-pay

## 2022-08-04 NOTE — Telephone Encounter (Signed)
Wegovy 1.7 has been approved through [facility] until 01/30/23.

## 2022-09-24 ENCOUNTER — Telehealth: Payer: Self-pay | Admitting: Hematology and Oncology

## 2022-09-24 NOTE — Telephone Encounter (Signed)
Left patient a vm regarding upcoming appointment reschedule

## 2022-10-20 ENCOUNTER — Ambulatory Visit: Payer: Managed Care, Other (non HMO) | Admitting: Hematology and Oncology

## 2022-10-24 ENCOUNTER — Other Ambulatory Visit: Payer: Self-pay

## 2022-10-24 ENCOUNTER — Inpatient Hospital Stay: Payer: Managed Care, Other (non HMO)

## 2022-10-24 ENCOUNTER — Inpatient Hospital Stay: Payer: Managed Care, Other (non HMO) | Attending: Hematology and Oncology | Admitting: Hematology and Oncology

## 2022-10-24 VITALS — BP 129/90 | HR 70 | Temp 97.9°F | Resp 13 | Wt 256.6 lb

## 2022-10-24 DIAGNOSIS — M069 Rheumatoid arthritis, unspecified: Secondary | ICD-10-CM | POA: Diagnosis not present

## 2022-10-24 DIAGNOSIS — Z809 Family history of malignant neoplasm, unspecified: Secondary | ICD-10-CM | POA: Insufficient documentation

## 2022-10-24 DIAGNOSIS — D500 Iron deficiency anemia secondary to blood loss (chronic): Secondary | ICD-10-CM | POA: Diagnosis not present

## 2022-10-24 DIAGNOSIS — F32A Depression, unspecified: Secondary | ICD-10-CM | POA: Insufficient documentation

## 2022-10-24 DIAGNOSIS — R5383 Other fatigue: Secondary | ICD-10-CM | POA: Diagnosis not present

## 2022-10-24 DIAGNOSIS — Z8261 Family history of arthritis: Secondary | ICD-10-CM | POA: Diagnosis not present

## 2022-10-24 DIAGNOSIS — Z9049 Acquired absence of other specified parts of digestive tract: Secondary | ICD-10-CM | POA: Diagnosis not present

## 2022-10-24 DIAGNOSIS — Z87440 Personal history of urinary (tract) infections: Secondary | ICD-10-CM | POA: Diagnosis not present

## 2022-10-24 DIAGNOSIS — D509 Iron deficiency anemia, unspecified: Secondary | ICD-10-CM | POA: Diagnosis not present

## 2022-10-24 DIAGNOSIS — Z800 Family history of malignant neoplasm of digestive organs: Secondary | ICD-10-CM | POA: Insufficient documentation

## 2022-10-24 DIAGNOSIS — F419 Anxiety disorder, unspecified: Secondary | ICD-10-CM | POA: Diagnosis not present

## 2022-10-24 DIAGNOSIS — Z9089 Acquired absence of other organs: Secondary | ICD-10-CM | POA: Insufficient documentation

## 2022-10-24 DIAGNOSIS — Z880 Allergy status to penicillin: Secondary | ICD-10-CM | POA: Insufficient documentation

## 2022-10-24 DIAGNOSIS — Z79899 Other long term (current) drug therapy: Secondary | ICD-10-CM | POA: Insufficient documentation

## 2022-10-24 LAB — COMPREHENSIVE METABOLIC PANEL
ALT: 21 U/L (ref 0–44)
AST: 18 U/L (ref 15–41)
Albumin: 4 g/dL (ref 3.5–5.0)
Alkaline Phosphatase: 71 U/L (ref 38–126)
Anion gap: 7 (ref 5–15)
BUN: 11 mg/dL (ref 6–20)
CO2: 27 mmol/L (ref 22–32)
Calcium: 9 mg/dL (ref 8.9–10.3)
Chloride: 107 mmol/L (ref 98–111)
Creatinine, Ser: 0.99 mg/dL (ref 0.44–1.00)
GFR, Estimated: >60 mL/min (ref >60)
Glucose, Bld: 77 mg/dL (ref 70–99)
Potassium: 3.6 mmol/L (ref 3.5–5.1)
Sodium: 141 mmol/L (ref 135–145)
Total Bilirubin: 0.3 mg/dL (ref 0.3–1.2)
Total Protein: 7.1 g/dL (ref 6.5–8.1)

## 2022-10-24 LAB — CBC WITH DIFFERENTIAL/PLATELET
Abs Immature Granulocytes: 0.05 10*3/uL (ref 0.00–0.07)
Basophils Absolute: 0.1 10*3/uL (ref 0.0–0.1)
Basophils Relative: 1 %
Eosinophils Absolute: 0.2 10*3/uL (ref 0.0–0.5)
Eosinophils Relative: 2 %
HCT: 40.3 % (ref 36.0–46.0)
Hemoglobin: 12.8 g/dL (ref 12.0–15.0)
Immature Granulocytes: 0 %
Lymphocytes Relative: 27 %
Lymphs Abs: 3 10*3/uL (ref 0.7–4.0)
MCH: 26.7 pg (ref 26.0–34.0)
MCHC: 31.8 g/dL (ref 30.0–36.0)
MCV: 84 fL (ref 80.0–100.0)
Monocytes Absolute: 1.1 10*3/uL — ABNORMAL HIGH (ref 0.1–1.0)
Monocytes Relative: 10 %
Neutro Abs: 6.9 10*3/uL (ref 1.7–7.7)
Neutrophils Relative %: 60 %
Platelets: 283 10*3/uL (ref 150–400)
RBC: 4.8 MIL/uL (ref 3.87–5.11)
RDW: 13.2 % (ref 11.5–15.5)
WBC: 11.3 10*3/uL — ABNORMAL HIGH (ref 4.0–10.5)
nRBC: 0 % (ref 0.0–0.2)

## 2022-10-24 LAB — FERRITIN: Ferritin: 10 ng/mL — ABNORMAL LOW (ref 11–307)

## 2022-10-24 LAB — IRON AND IRON BINDING CAPACITY (CC-WL,HP ONLY)
Iron: 40 ug/dL (ref 28–170)
Saturation Ratios: 8 % — ABNORMAL LOW (ref 10.4–31.8)
TIBC: 486 ug/dL — ABNORMAL HIGH (ref 250–450)
UIBC: 446 ug/dL — ABNORMAL HIGH (ref 148–442)

## 2022-10-24 NOTE — Progress Notes (Signed)
[facility] CONSULT NOTE  Patient Care Team: Early, Sung Amabile, NP as PCP - General (Nurse Practitioner) Zenovia Jordan, MD as Consulting Physician (Rheumatology) Mitchel Honour, DO as Consulting Physician (Obstetrics and Gynecology) Adela Lank, Willaim Rayas, MD as Consulting Physician (Gastroenterology)  CHIEF COMPLAINTS/PURPOSE OF CONSULTATION:  IDA  ASSESSMENT & PLAN:  This is a pleasant 37 year old female patient with rheumatoid arthritis on hydroxychloroquine referred to hematology for evaluation and recommendations regarding iron deficiency.  She last received iron infusion in June 2022.  She today does report some fatigue for the past 2 weeks. She denies any other complaints.  Menstrual cycles are regular and not particularly heavy.  She has been doing some weight training and lost about 20 pounds, has been eating a lot of protein.  No concerns on exam.  CBC today showed hemoglobin of 12.8 g/dL, iron panel showed total iron binding capacity supporting iron deficiency, ferritin pending.  We can consider intravenous iron infusion if ferritin is very low.  Otherwise she should be able to give a trial of oral iron for the next 8 to 12 weeks and come back for follow-up.  Thank you for consulting [place] in the care of this patient please not hesitate to contact us with any questions or concerns.  HISTORY OF PRESENTING ILLNESS:   Laura [name] 37 y.o. female is here because of IDA.    This is a very pleasant 37 year old female patient who arrived to the appointment by herself for evaluation and recommendations for an iron deficiency anemia.    Interim History  She is here for follow up on IDA.since her last visit, she says she has been feeling more fatigued and wonders if she is iron deficient again.  She has lost about 20 pounds, has been focusing on weight training and healthy eating.  She denies any changes in breathing, bowel habits or urinary habits.  Rest of the pertinent 10  point ROS reviewed and negative  MEDICAL HISTORY:  Past Medical History:  Diagnosis Date   Anemia    Anxiety    Anxiety 12/31/2018   Arthritis    Depression    Depression    Phreesia 02/25/2020   Depressive disorder 03/28/2014   Establishing care with new doctor, encounter for 07/30/2020   Flatulence, eructation and gas pain 07/30/2020   Frequent headaches    History of chicken pox    Otitis media    had tubes as child, then according to pt adipose tissue used to repair ear drum   Rheumatoid arthritis (HCC)    UTI (lower urinary tract infection)     SURGICAL HISTORY: Past Surgical History:  Procedure Laterality Date   APPENDECTOMY  2012   HYMENECTOMY  2000   TONSILECTOMY, ADENOIDECTOMY, BILATERAL MYRINGOTOMY AND TUBES      SOCIAL HISTORY: Social History   Socioeconomic History   Marital status: Single    Spouse name: Not on file   Number of children: 0   Years of education: Not on file   Highest education level: Not on file  Occupational History   Occupation: Corporate treasurer: [employer]  Tobacco Use   Smoking status: Never   Smokeless tobacco: Never  Vaping Use   Vaping status: Never Used  Substance and Sexual Activity   Alcohol use: Yes    Alcohol/week: 0.0 standard drinks of alcohol    Comment: occasionally    Drug use: No   Sexual activity: Not Currently    Birth control/protection:  Pill  Other Topics Concern   Not on file  Social History Narrative   Work or School: Designer, 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       Social Determinants of Health   Financial Resource Strain: Not on file  Food Insecurity: Not on file  Transportation Needs: Not on file  Physical Activity: Not on file  Stress: Not on file  Social Connections: Not on file  Intimate Partner Violence: Not on file    FAMILY HISTORY: Family History   Problem Relation Age of Onset   Arthritis Paternal Grandmother    Cancer Maternal Grandmother    Cancer Maternal Grandfather    Liver cancer Paternal Grandfather     ALLERGIES:  is allergic to kenalog [triamcinolone acetonide] and amoxicillin.  MEDICATIONS:  Current Outpatient Medications  Medication Sig Dispense Refill   Cholecalciferol (VITAMIN D3) 1.25 MG (50000 UT) TABS Take 1 tablet by mouth once a week. 12 tablet 1   escitalopram (LEXAPRO) 20 MG tablet TAKE 1 TABLET(20 MG) BY MOUTH AT BEDTIME 90 tablet 3   HUMIRA PEN 40 MG/0.4ML PNKT SMARTSIG:40 Milligram(s) SUB-Q Every 2 Weeks     hydroxychloroquine (PLAQUENIL) 200 MG tablet Take 1 tablet (200 mg total) by mouth 2 (two) times daily. 60 tablet 0   LORazepam (ATIVAN) 0.5 MG tablet Take 1 tablet (0.5 mg total) by mouth every 8 (eight) hours as needed for anxiety. (Patient not taking: Reported on 07/22/2022) 20 tablet 0   ondansetron (ZOFRAN) 8 MG tablet Take 1 tablet (8 mg total) by mouth every 8 (eight) hours as needed for nausea. 30 tablet 2   Semaglutide-Weight Management (WEGOVY) 0.5 MG/0.5ML SOAJ Inject 0.5 mg into the skin once a week. 2 mL 0   Semaglutide-Weight Management (WEGOVY) 1 MG/0.5ML SOAJ Inject 1 mg into the skin once a week. 2 mL 0   Semaglutide-Weight Management (WEGOVY) 1.7 MG/0.75ML SOAJ Inject 1.7 mg into the skin once a week. 3 mL 0   No current facility-administered medications for this visit.     PHYSICAL EXAMINATION: ECOG PERFORMANCE STATUS: 1 - Symptomatic but completely ambulatory  Vitals:   10/24/22 1338  BP: (!) 129/90  Pulse: 70  Resp: 13  Temp: 97.9 F (36.6 C)  SpO2: 95%     Filed Weights   10/24/22 1338  Weight: 256 lb 9.6 oz (116.4 kg)   GENERAL:alert, no distress and comfortable, obese. Neck: No palpable regional adenopathy. Chest: Clear to auscultation bilaterally Heart: Rate and rhythm regular Abdomen: Soft, nondistended, nontender No lower extremity edema  LABORATORY DATA:   I have reviewed the data as listed Lab Results  Component Value Date   WBC 7.7 04/15/2022   HGB 14.1 04/15/2022   HCT 43.4 04/15/2022   MCV 83.9 04/15/2022   PLT 295 04/15/2022     Chemistry      Component Value Date/Time   NA 137 04/15/2022 1330   NA 144 08/06/2021 1411   K 4.1 04/15/2022 1330   CL 104 04/15/2022 1330   CO2 27 04/15/2022 1330   BUN 12 04/15/2022 1330   BUN 8 08/06/2021 1411   CREATININE 0.85 04/15/2022 1330   CREATININE 0.80 04/29/2012 1724      Component Value Date/Time   CALCIUM 9.3 04/15/2022 1330   ALKPHOS 69 04/15/2022 1330   AST 13 (L) 04/15/2022 1330   ALT 13 04/15/2022  1330   BILITOT 0.4 04/15/2022 1330      Labs from today reviewed, hemoglobin normal, however ferritin low normal and total iron binding capacity is slowly increasing which suggests low-level iron deficiency.  RADIOGRAPHIC STUDIES: I have personally reviewed the radiological images as listed and agreed with the findings in the report. No results found.  All questions were answered. The patient knows to call the clinic with any problems, questions or concerns. I spent a total of 20 minutes in the care of this patient including history and physical, review of records, counseling and coordination of care.     Rachel Moulds, MD 10/24/2022 1:52 PM

## 2022-10-28 ENCOUNTER — Encounter: Payer: Self-pay | Admitting: Hematology and Oncology

## 2022-10-31 ENCOUNTER — Encounter: Payer: Self-pay | Admitting: Nurse Practitioner

## 2022-10-31 ENCOUNTER — Encounter: Payer: Self-pay | Admitting: *Deleted

## 2022-10-31 DIAGNOSIS — E611 Iron deficiency: Secondary | ICD-10-CM

## 2022-11-04 ENCOUNTER — Observation Stay (HOSPITAL_COMMUNITY): Payer: Managed Care, Other (non HMO)

## 2022-11-04 ENCOUNTER — Encounter (HOSPITAL_COMMUNITY): Payer: Self-pay | Admitting: Radiology

## 2022-11-04 ENCOUNTER — Emergency Department (HOSPITAL_COMMUNITY): Payer: Managed Care, Other (non HMO)

## 2022-11-04 ENCOUNTER — Other Ambulatory Visit: Payer: Self-pay

## 2022-11-04 ENCOUNTER — Inpatient Hospital Stay (HOSPITAL_COMMUNITY)
Admission: EM | Admit: 2022-11-04 | Discharge: 2022-11-07 | DRG: 065 | Disposition: A | Discharge status: Home / Self Care | Payer: Managed Care, Other (non HMO) | Attending: Internal Medicine | Admitting: Internal Medicine

## 2022-11-04 DIAGNOSIS — Z79899 Other long term (current) drug therapy: Secondary | ICD-10-CM | POA: Diagnosis not present

## 2022-11-04 DIAGNOSIS — M069 Rheumatoid arthritis, unspecified: Secondary | ICD-10-CM | POA: Diagnosis present

## 2022-11-04 DIAGNOSIS — I253 Aneurysm of heart: Secondary | ICD-10-CM | POA: Diagnosis not present

## 2022-11-04 DIAGNOSIS — F411 Generalized anxiety disorder: Secondary | ICD-10-CM | POA: Diagnosis not present

## 2022-11-04 DIAGNOSIS — I10 Essential (primary) hypertension: Secondary | ICD-10-CM | POA: Diagnosis present

## 2022-11-04 DIAGNOSIS — Z7985 Long-term (current) use of injectable non-insulin antidiabetic drugs: Secondary | ICD-10-CM

## 2022-11-04 DIAGNOSIS — I6389 Other cerebral infarction: Principal | ICD-10-CM | POA: Diagnosis present

## 2022-11-04 DIAGNOSIS — Z888 Allergy status to other drugs, medicaments and biological substances status: Secondary | ICD-10-CM | POA: Diagnosis not present

## 2022-11-04 DIAGNOSIS — H539 Unspecified visual disturbance: Secondary | ICD-10-CM

## 2022-11-04 DIAGNOSIS — Z6841 Body Mass Index (BMI) 40.0 and over, adult: Secondary | ICD-10-CM

## 2022-11-04 DIAGNOSIS — H532 Diplopia: Secondary | ICD-10-CM | POA: Diagnosis not present

## 2022-11-04 DIAGNOSIS — R29701 NIHSS score 1: Secondary | ICD-10-CM | POA: Diagnosis present

## 2022-11-04 DIAGNOSIS — Z8673 Personal history of transient ischemic attack (TIA), and cerebral infarction without residual deficits: Secondary | ICD-10-CM | POA: Diagnosis present

## 2022-11-04 DIAGNOSIS — Z880 Allergy status to penicillin: Secondary | ICD-10-CM | POA: Diagnosis not present

## 2022-11-04 DIAGNOSIS — F32A Depression, unspecified: Secondary | ICD-10-CM | POA: Diagnosis present

## 2022-11-04 DIAGNOSIS — E669 Obesity, unspecified: Secondary | ICD-10-CM

## 2022-11-04 DIAGNOSIS — E785 Hyperlipidemia, unspecified: Secondary | ICD-10-CM | POA: Diagnosis not present

## 2022-11-04 DIAGNOSIS — I639 Cerebral infarction, unspecified: Secondary | ICD-10-CM

## 2022-11-04 LAB — DIFFERENTIAL
Abs Immature Granulocytes: 0.05 10*3/uL (ref 0.00–0.07)
Basophils Absolute: 0.1 10*3/uL (ref 0.0–0.1)
Basophils Relative: 1 %
Eosinophils Absolute: 0.1 10*3/uL (ref 0.0–0.5)
Eosinophils Relative: 1 %
Immature Granulocytes: 0 %
Lymphocytes Relative: 22 %
Lymphs Abs: 2.5 10*3/uL (ref 0.7–4.0)
Monocytes Absolute: 0.7 10*3/uL (ref 0.1–1.0)
Monocytes Relative: 6 %
Neutro Abs: 7.9 10*3/uL — ABNORMAL HIGH (ref 1.7–7.7)
Neutrophils Relative %: 70 %

## 2022-11-04 LAB — I-STAT CHEM 8, ED
BUN: 11 mg/dL (ref 6–20)
Calcium, Ion: 1.1 mmol/L — ABNORMAL LOW (ref 1.15–1.40)
Chloride: 105 mmol/L (ref 98–111)
Creatinine, Ser: 0.8 mg/dL (ref 0.44–1.00)
Glucose, Bld: 88 mg/dL (ref 70–99)
HCT: 39 % (ref 36.0–46.0)
Hemoglobin: 13.3 g/dL (ref 12.0–15.0)
Potassium: 3.4 mmol/L — ABNORMAL LOW (ref 3.5–5.1)
Sodium: 138 mmol/L (ref 135–145)
TCO2: 22 mmol/L (ref 22–32)

## 2022-11-04 LAB — COMPREHENSIVE METABOLIC PANEL
ALT: 22 U/L (ref 0–44)
AST: 18 U/L (ref 15–41)
Albumin: 3.9 g/dL (ref 3.5–5.0)
Alkaline Phosphatase: 79 U/L (ref 38–126)
Anion gap: 7 (ref 5–15)
BUN: 12 mg/dL (ref 6–20)
CO2: 23 mmol/L (ref 22–32)
Calcium: 8.6 mg/dL — ABNORMAL LOW (ref 8.9–10.3)
Chloride: 105 mmol/L (ref 98–111)
Creatinine, Ser: 0.88 mg/dL (ref 0.44–1.00)
GFR, Estimated: >60 mL/min (ref >60)
Glucose, Bld: 103 mg/dL — ABNORMAL HIGH (ref 70–99)
Potassium: 3.7 mmol/L (ref 3.5–5.1)
Sodium: 135 mmol/L (ref 135–145)
Total Bilirubin: 0.6 mg/dL (ref 0.3–1.2)
Total Protein: 7.6 g/dL (ref 6.5–8.1)

## 2022-11-04 LAB — PROTIME-INR
INR: 1 (ref 0.8–1.2)
Prothrombin Time: 13.2 seconds (ref 11.4–15.2)

## 2022-11-04 LAB — URINALYSIS, ROUTINE W REFLEX MICROSCOPIC
Bilirubin Urine: NEGATIVE
Glucose, UA: NEGATIVE mg/dL
Ketones, ur: NEGATIVE mg/dL
Nitrite: NEGATIVE
Protein, ur: NEGATIVE mg/dL
Specific Gravity, Urine: >1.046 — ABNORMAL HIGH (ref 1.005–1.030)
pH: 6 (ref 5.0–8.0)

## 2022-11-04 LAB — CBC
HCT: 44.3 % (ref 36.0–46.0)
Hemoglobin: 13.8 g/dL (ref 12.0–15.0)
MCH: 26.2 pg (ref 26.0–34.0)
MCHC: 31.2 g/dL (ref 30.0–36.0)
MCV: 84.1 fL (ref 80.0–100.0)
Platelets: 320 10*3/uL (ref 150–400)
RBC: 5.27 MIL/uL — ABNORMAL HIGH (ref 3.87–5.11)
RDW: 12.9 % (ref 11.5–15.5)
WBC: 11.3 10*3/uL — ABNORMAL HIGH (ref 4.0–10.5)
nRBC: 0 % (ref 0.0–0.2)

## 2022-11-04 LAB — RAPID URINE DRUG SCREEN, HOSP PERFORMED
Amphetamines: NOT DETECTED
Barbiturates: NOT DETECTED
Benzodiazepines: NOT DETECTED
Cocaine: NOT DETECTED
Opiates: NOT DETECTED
Tetrahydrocannabinol: NOT DETECTED

## 2022-11-04 LAB — APTT: aPTT: 30 seconds (ref 24–36)

## 2022-11-04 LAB — ETHANOL: Alcohol, Ethyl (B): <10 mg/dL (ref <10)

## 2022-11-04 LAB — HCG, SERUM, QUALITATIVE: Preg, Serum: NEGATIVE

## 2022-11-04 LAB — CBG MONITORING, ED: Glucose-Capillary: 108 mg/dL — ABNORMAL HIGH (ref 70–99)

## 2022-11-04 MED ORDER — ACETAMINOPHEN 650 MG RE SUPP: 650.0000 mg | RECTAL | Status: DC | PRN

## 2022-11-04 MED ORDER — GADOBUTROL 1 MMOL/ML IV SOLN
10.0000 mL | Freq: Once | INTRAVENOUS | Status: AC | PRN
  Administered 2022-11-04: 10 mL via INTRAVENOUS

## 2022-11-04 MED ORDER — HYDROXYCHLOROQUINE SULFATE 200 MG PO TABS
200.0000 mg | ORAL_TABLET | Freq: Two times a day (BID) | ORAL | Status: DC
  Administered 2022-11-05 – 2022-11-07 (×5): 200 mg via ORAL
  Filled 2022-11-04 (×7): qty 1

## 2022-11-04 MED ORDER — ACETAMINOPHEN 160 MG/5ML PO SOLN: 650.0000 mg | ORAL | Status: DC | PRN

## 2022-11-04 MED ORDER — SODIUM CHLORIDE 0.9 % IV SOLN: INTRAVENOUS | Status: AC

## 2022-11-04 MED ORDER — SENNOSIDES-DOCUSATE SODIUM 8.6-50 MG PO TABS: 1.0000 | ORAL_TABLET | Freq: Every evening | ORAL | Status: DC | PRN

## 2022-11-04 MED ORDER — ACETAMINOPHEN 325 MG PO TABS: 650.0000 mg | ORAL_TABLET | ORAL | Status: DC | PRN

## 2022-11-04 MED ORDER — METOCLOPRAMIDE HCL 5 MG/ML IJ SOLN
10.0000 mg | Freq: Once | INTRAMUSCULAR | Status: AC
  Administered 2022-11-04: 10 mg via INTRAVENOUS
  Filled 2022-11-04: qty 2

## 2022-11-04 MED ORDER — ESCITALOPRAM OXALATE 10 MG PO TABS
20.0000 mg | ORAL_TABLET | Freq: Every day | ORAL | Status: DC
  Administered 2022-11-04 – 2022-11-06 (×3): 20 mg via ORAL
  Filled 2022-11-04 (×3): qty 2

## 2022-11-04 MED ORDER — IOHEXOL 350 MG/ML SOLN
100.0000 mL | Freq: Once | INTRAVENOUS | Status: AC | PRN
  Administered 2022-11-04: 100 mL via INTRAVENOUS

## 2022-11-04 MED ORDER — STROKE: EARLY STAGES OF RECOVERY BOOK
Freq: Once | Status: AC
  Administered 2022-11-05: 1
  Filled 2022-11-04: qty 1

## 2022-11-04 NOTE — ED Notes (Signed)
[facility] here to transport pt to [place]. Transfer paperwork provided, VS updated. Pt left facility in stable condition

## 2022-11-04 NOTE — H&P (Addendum)
History and Physical    Laura [name]:[id number] DOB: 1985/10/22 DOA: 11/04/2022  PCP: Tollie Eth, NP   Patient coming from: Home   Chief Complaint: Vision disturbance   HPI: Laura [name] is a pleasant 37 y.o. female with medical history significant for rheumatoid arthritis and anxiety who presents for evaluation of vision disturbance that developed around 11:30 AM today.  Patient reports that she was in her usual state and having an uneventful day when she noticed trouble with her vision at approximately 11:30 AM.  She describes double vision which resolves when closing 1 eye.  She has also been experiencing difficulty with balance but denies any focal numbness or weakness.  She denies difficulty with speech or swallowing.  She denies fevers, chills, or recent trauma.  ED Course: Upon arrival to the ED, patient is found to be afebrile and saturating well on room air with normal heart rate and stable blood pressure.  Head CT and CTA of the head and neck were normal.  Neurology (Dr. Otelia Limes) was consulted and recommended admission [facility] and further evaluation with MRI brain and MR orbits.  Review of Systems:  All other systems reviewed and apart from HPI, are negative.  Past Medical History:  Diagnosis Date   Anemia    Anxiety    Anxiety 12/31/2018   Arthritis    Depression    Depression    Phreesia 02/25/2020   Depressive disorder 03/28/2014   Establishing care with new doctor, encounter for 07/30/2020   Flatulence, eructation and gas pain 07/30/2020   Frequent headaches    History of chicken pox    Otitis media    had tubes as child, then according to pt adipose tissue used to repair ear drum   Rheumatoid arthritis (HCC)    UTI (lower urinary tract infection)     Past Surgical History:  Procedure Laterality Date   APPENDECTOMY  2012   HYMENECTOMY  2000   TONSILECTOMY, ADENOIDECTOMY, BILATERAL MYRINGOTOMY AND TUBES      Social History:    reports that she has never smoked. She has never used smokeless tobacco. She reports current alcohol use. She reports that she does not use drugs.  Allergies  Allergen Reactions   Kenalog [Triamcinolone Acetonide]     Rash noted after ankle injection by rheumatologist   Amoxicillin Rash    Family History  Problem Relation Age of Onset   Arthritis Paternal Grandmother    Cancer Maternal Grandmother    Cancer Maternal Grandfather    Liver cancer Paternal Grandfather      Prior to Admission medications   Medication Sig Start Date End Date Taking? Authorizing Provider  Cholecalciferol (VITAMIN D3) 1.25 MG (50000 UT) TABS Take 1 tablet by mouth once a week. 07/23/22   Tollie Eth, NP  escitalopram (LEXAPRO) 20 MG tablet TAKE 1 TABLET(20 MG) BY MOUTH AT BEDTIME 07/22/22   Early, Sung Amabile, NP  HUMIRA PEN 40 MG/0.4ML PNKT SMARTSIG:40 Milligram(s) SUB-Q Every 2 Weeks 07/19/20   [provider]  hydroxychloroquine (PLAQUENIL) 200 MG tablet Take 1 tablet (200 mg total) by mouth 2 (two) times daily. 02/28/20   Just, Azalee Course, FNP  LORazepam (ATIVAN) 0.5 MG tablet Take 1 tablet (0.5 mg total) by mouth every 8 (eight) hours as needed for anxiety. Patient not taking: Reported on 07/22/2022 01/23/22   Early, Sung Amabile, NP  ondansetron (ZOFRAN) 8 MG tablet Take 1 tablet (8 mg total) by mouth every 8 (  eight) hours as needed for nausea. 07/22/22   Tollie Eth, NP  Semaglutide-Weight Management (WEGOVY) 0.5 MG/0.5ML SOAJ Inject 0.5 mg into the skin once a week. 07/22/22   Tollie Eth, NP  Semaglutide-Weight Management (WEGOVY) 1 MG/0.5ML SOAJ Inject 1 mg into the skin once a week. 07/22/22   Tollie Eth, NP  Semaglutide-Weight Management (WEGOVY) 1.7 MG/0.75ML SOAJ Inject 1.7 mg into the skin once a week. 07/22/22   Tollie Eth, NP    Physical Exam: Vitals:   11/04/22 1357 11/04/22 1425  BP: (!) 149/95   Pulse: 80   Resp: 18   Temp: 98.3 F (36.8 C)   TempSrc: Oral   SpO2: 97%   Weight:   113.4 kg  Height:  5\' 6"  (1.676 m)     Constitutional: NAD, calm  Eyes: PERTLA, lids and conjunctivae normal ENMT: Mucous membranes are moist. Posterior pharynx clear of any exudate or lesions.   Neck: supple, no masses  Respiratory: no wheezing, no crackles. No accessory muscle use.  Cardiovascular: S1 & S2 heard, regular rate and rhythm. No extremity edema.   Abdomen: No distension, no tenderness, soft. Bowel sounds active.  Musculoskeletal: no clubbing / cyanosis. No joint deformity upper and lower extremities.   Skin: no significant rashes, lesions, ulcers. Warm, dry, well-perfused. Neurologic: Subjective vision disturbance, CN 2-12 grossly intact otherwise. Sensation to light touch intact. Strength 5/5 in all 4 limbs. Alert and oriented.  Psychiatric: Pleasant. Cooperative.    Labs and Imaging on Admission: I have personally reviewed following labs and imaging studies  CBC: Recent Labs  Lab 11/04/22 1435 11/04/22 1520  WBC 11.3*  --   NEUTROABS 7.9*  --   HGB 13.8 13.3  HCT 44.3 39.0  MCV 84.1  --   PLT 320  --    Basic Metabolic Panel: Recent Labs  Lab 11/04/22 1435 11/04/22 1520  NA 135 138  K 3.7 3.4*  CL 105 105  CO2 23  --   GLUCOSE 103* 88  BUN 12 11  CREATININE 0.88 0.80  CALCIUM 8.6*  --    GFR: Estimated Creatinine Clearance: 123 mL/min (by C-G formula based on SCr of 0.8 mg/dL). Liver Function Tests: Recent Labs  Lab 11/04/22 1435  AST 18  ALT 22  ALKPHOS 79  BILITOT 0.6  PROT 7.6  ALBUMIN 3.9   No results for input(s): "LIPASE", "AMYLASE" in the last 168 hours. No results for input(s): "AMMONIA" in the last 168 hours. Coagulation Profile: Recent Labs  Lab 11/04/22 1435  INR 1.0   Cardiac Enzymes: No results for input(s): "CKTOTAL", "CKMB", "CKMBINDEX", "TROPONINI" in the last 168 hours. BNP (last 3 results) No results for input(s): "PROBNP" in the last 8760 hours. HbA1C: No results for input(s): "HGBA1C" in the last 72  hours. CBG: Recent Labs  Lab 11/04/22 1427  GLUCAP 108*   Lipid Profile: No results for input(s): "CHOL", "HDL", "LDLCALC", "TRIG", "CHOLHDL", "LDLDIRECT" in the last 72 hours. Thyroid Function Tests: No results for input(s): "TSH", "T4TOTAL", "FREET4", "T3FREE", "THYROIDAB" in the last 72 hours. Anemia Panel: No results for input(s): "VITAMINB12", "FOLATE", "FERRITIN", "TIBC", "IRON", "RETICCTPCT" in the last 72 hours. Urine analysis:    Component Value Date/Time   COLORURINE YELLOW 11/04/2022 1607   APPEARANCEUR CLEAR 11/04/2022 1607   LABSPEC >1.046 (H) 11/04/2022 1607   PHURINE 6.0 11/04/2022 1607   GLUCOSEU NEGATIVE 11/04/2022 1607   HGBUR MODERATE (A) 11/04/2022 1607   BILIRUBINUR NEGATIVE 11/04/2022 1607   KETONESUR  NEGATIVE 11/04/2022 1607   PROTEINUR NEGATIVE 11/04/2022 1607   UROBILINOGEN 0.2 03/27/2008 0814   NITRITE NEGATIVE 11/04/2022 1607   LEUKOCYTESUR LARGE (A) 11/04/2022 1607   Sepsis Labs: @LABRCNTIP (procalcitonin:4,lacticidven:4) )No results found for this or any previous visit (from the past 240 hour(s)).   Radiological Exams on Admission: CT HEAD CODE STROKE WO CONTRAST  Result Date: 11/04/2022 CLINICAL DATA:  Code stroke. Sudden onset vision changes. Double vision in right eye. EXAM: CT ANGIOGRAPHY HEAD AND NECK CT PERFUSION BRAIN TECHNIQUE: Multidetector CT imaging of the head and neck was performed using the standard protocol during bolus administration of intravenous contrast. Multiplanar CT image reconstructions and MIPs were obtained to evaluate the vascular anatomy. Carotid stenosis measurements (when applicable) are obtained utilizing NASCET criteria, using the distal internal carotid diameter as the denominator. Multiphase CT imaging of the brain was performed following IV bolus contrast injection. Subsequent parametric perfusion maps were calculated using RAPID software. RADIATION DOSE REDUCTION: This exam was performed according to the departmental  dose-optimization program which includes automated exposure control, adjustment of the mA and/or kV according to patient size and/or use of iterative reconstruction technique. CONTRAST:  OMNIPAQUE IOHEXOL 350 MG/ML SOLN COMPARISON:  None Available. FINDINGS: CT HEAD FINDINGS Brain: There is no acute intracranial hemorrhage, extra-axial fluid collection, or acute infarct. Parenchymal volume is normal. The ventricles are normal in size. Gray-white differentiation is preserved The pituitary and suprasellar region are normal. There is no mass lesion. There is no mass effect or midline shift. Vascular: No hyperdense vessel or unexpected calcification. Skull: Normal. Negative for fracture or focal lesion. Sinuses/Orbits: The paranasal sinuses are clear. The globes and orbits are unremarkable. Other: The mastoid air cells and middle ear cavities are clear. ASPECTS [facility] Stroke Program Early CT Score) - Ganglionic level infarction (caudate, lentiform nuclei, internal capsule, insula, M1-M3 cortex): 7 - Supraganglionic infarction (M4-M6 cortex): 3 Total score (0-10 with 10 being normal): 10 CTA NECK FINDINGS Aortic arch: The imaged aortic arch is normal. The origins of the major branch vessels are patent. The subclavian arteries are patent to the level imaged. Right carotid system: The right common, internal, and external carotid arteries are patent, without hemodynamically significant stenosis or occlusion there is no evidence of dissection or aneurysm. Left carotid system: The left common, internal, and external carotid arteries are patent, without hemodynamically significant stenosis or occlusion. There is no evidence of dissection or aneurysm. Vertebral arteries: The vertebral arteries are patent, without hemodynamically significant stenosis or occlusion there is no evidence of dissection or aneurysm. Skeleton: There is no acute osseous abnormality or suspicious osseous lesion. There is no visible canal  hematoma. Other neck: The soft tissues of the neck are unremarkable. Upper chest: The imaged lung apices are clear. Review of the MIP images confirms the above findings CTA HEAD FINDINGS Anterior circulation: The intracranial ICAs are normal. The ophthalmic arteries are identified bilaterally. The bilateral MCAs and ACAS are normal. The anterior communicating artery is normal. There is no aneurysm or AVM. Posterior circulation: The bilateral V4 segments are normal. The basilar artery is normal. The major cerebellar arteries appear patent. The bilateral PCAs are normal. Bilateral posterior communicating arteries are identified with a predominantly fetal origin on the right. There is no aneurysm or AVM. Venous sinuses: Patent. Anatomic variants: As above. Review of the MIP images confirms the above findings CT Brain Perfusion Findings: ASPECTS: 10 CBF (<30%) Volume: 0mL Perfusion (Tmax>6.0s) volume: 0mL Mismatch Volume: 0mL Infarction Location:N/a IMPRESSION: 1. Normal noncontrast head CT.  2. Normal CTA of the neck. 3. Negative CT perfusion. The results of the initial noncontrast head CT were called by telephone at the time of interpretation on 11/04/2022 at 2:51 pm to provider Dr Earlene Plater , who verbally acknowledged these results. Electronically Signed   By: Lesia Hausen M.D.   On: 11/04/2022 15:08   CT ANGIO HEAD NECK W WO CM W PERF (CODE STROKE)  Result Date: 11/04/2022 CLINICAL DATA:  Code stroke. Sudden onset vision changes. Double vision in right eye. EXAM: CT ANGIOGRAPHY HEAD AND NECK CT PERFUSION BRAIN TECHNIQUE: Multidetector CT imaging of the head and neck was performed using the standard protocol during bolus administration of intravenous contrast. Multiplanar CT image reconstructions and MIPs were obtained to evaluate the vascular anatomy. Carotid stenosis measurements (when applicable) are obtained utilizing NASCET criteria, using the distal internal carotid diameter as the denominator. Multiphase CT  imaging of the brain was performed following IV bolus contrast injection. Subsequent parametric perfusion maps were calculated using RAPID software. RADIATION DOSE REDUCTION: This exam was performed according to the departmental dose-optimization program which includes automated exposure control, adjustment of the mA and/or kV according to patient size and/or use of iterative reconstruction technique. CONTRAST:  OMNIPAQUE IOHEXOL 350 MG/ML SOLN COMPARISON:  None Available. FINDINGS: CT HEAD FINDINGS Brain: There is no acute intracranial hemorrhage, extra-axial fluid collection, or acute infarct. Parenchymal volume is normal. The ventricles are normal in size. Gray-white differentiation is preserved The pituitary and suprasellar region are normal. There is no mass lesion. There is no mass effect or midline shift. Vascular: No hyperdense vessel or unexpected calcification. Skull: Normal. Negative for fracture or focal lesion. Sinuses/Orbits: The paranasal sinuses are clear. The globes and orbits are unremarkable. Other: The mastoid air cells and middle ear cavities are clear. ASPECTS [facility] Stroke Program Early CT Score) - Ganglionic level infarction (caudate, lentiform nuclei, internal capsule, insula, M1-M3 cortex): 7 - Supraganglionic infarction (M4-M6 cortex): 3 Total score (0-10 with 10 being normal): 10 CTA NECK FINDINGS Aortic arch: The imaged aortic arch is normal. The origins of the major branch vessels are patent. The subclavian arteries are patent to the level imaged. Right carotid system: The right common, internal, and external carotid arteries are patent, without hemodynamically significant stenosis or occlusion there is no evidence of dissection or aneurysm. Left carotid system: The left common, internal, and external carotid arteries are patent, without hemodynamically significant stenosis or occlusion. There is no evidence of dissection or aneurysm. Vertebral arteries: The vertebral arteries  are patent, without hemodynamically significant stenosis or occlusion there is no evidence of dissection or aneurysm. Skeleton: There is no acute osseous abnormality or suspicious osseous lesion. There is no visible canal hematoma. Other neck: The soft tissues of the neck are unremarkable. Upper chest: The imaged lung apices are clear. Review of the MIP images confirms the above findings CTA HEAD FINDINGS Anterior circulation: The intracranial ICAs are normal. The ophthalmic arteries are identified bilaterally. The bilateral MCAs and ACAS are normal. The anterior communicating artery is normal. There is no aneurysm or AVM. Posterior circulation: The bilateral V4 segments are normal. The basilar artery is normal. The major cerebellar arteries appear patent. The bilateral PCAs are normal. Bilateral posterior communicating arteries are identified with a predominantly fetal origin on the right. There is no aneurysm or AVM. Venous sinuses: Patent. Anatomic variants: As above. Review of the MIP images confirms the above findings CT Brain Perfusion Findings: ASPECTS: 10 CBF (<30%) Volume: 0mL Perfusion (Tmax>6.0s) volume: 0mL Mismatch  Volume: 0mL Infarction Location:N/a IMPRESSION: 1. Normal noncontrast head CT. 2. Normal CTA of the neck. 3. Negative CT perfusion. The results of the initial noncontrast head CT were called by telephone at the time of interpretation on 11/04/2022 at 2:51 pm to provider Dr Earlene Plater , who verbally acknowledged these results. Electronically Signed   By: Lesia Hausen M.D.   On: 11/04/2022 15:08    EKG: Independently reviewed. Sinus rhythm.   Assessment/Plan   1. Vision disturbance  - Appreciate neurology recommendations  - Continue neuro checks, follow-up pending MRI brain and orbits - will need LP if normal     Addendum: MRI reveals acute ischemic CVA. She does not need LP per neurology. Plan for CVA workup with frequent neuro checks, cardiac monitoring, check A1c, lipids, and  echocardiogram, consult PT/OT/SLP.    2. Rheumatoid arthritis  - Continue Plaquenil    3. Anxiety  - Continue Lexapro     DVT prophylaxis: SCDs  Code Status: Full  Level of Care: Level of care: Telemetry Medical Family Communication: none present  Disposition Plan:  Patient is from: Home  Anticipated d/c is to: Home Anticipated d/c date is: 11/05/22  Patient currently: Pending further workup  Consults called: Neurology  Admission status: Observation     Briscoe Deutscher, MD [facility]  11/04/2022, 6:02 PM

## 2022-11-04 NOTE — ED Provider Triage Note (Signed)
Emergency Medicine Provider Triage Evaluation Note  Laura [name] , a 37 y.o. female  was evaluated in triage.  Pt complains of sudden onset vision change.  Patient reports at 11:30 AM she noticed double vision in her right eye.  This occurred suddenly.  She complains of monocular double vision in the right eye with vertical distribution of the double image.  She did fall prior to arrival because her balance and depth per for suction also seem off.  She denies pain but feels like she is having trouble lining up her eyes completely.  She feels better when she has her right eye closed.  She feels dizzy and nauseous when she has both of her eyes open.  She denies headache weakness, facial droop, difficulty with speech or swallowing.  She has a history of RA.Marland Kitchen  Review of Systems  Positive: Vision change Negative: Headache  Physical Exam  BP (!) 149/95 (BP Location: Left Arm)   Pulse 80   Temp 98.3 F (36.8 C) (Oral)   Resp 18   Ht 5\' 6"  (1.676 m)   Wt 113.4 kg   SpO2 97%   BMI 40.35 kg/m  Gen:   Awake, no distress   Resp:  Normal effort  MSK:   Moves extremities without difficulty  Other:  Intermittent disconjugate gaze.  No visual field cuts.  Medical Decision Making  Medically screening exam initiated at 2:25 PM.  Appropriate orders placed.  Laura [name] was informed that the remainder of the evaluation will be completed by another provider, this initial triage assessment does not replace that evaluation, and the importance of remaining in the ED until their evaluation is complete.     Arthor Captain, PA-C 11/04/22 1427

## 2022-11-04 NOTE — ED Provider Notes (Addendum)
[place] EMERGENCY DEPARTMENT AT [facility] Provider Note   CSN: [id number] Arrival date & time: 11/04/22  1349     History  Chief Complaint  Patient presents with   Eye Problem    Laura [name] is a 37 y.o. female.   Eye Problem 37 year old woman with history of rheumatoid arthritis presenting for dizziness and vision changes.  Patient states at 11:30 AM today she had sudden onset double vision.  She sees 2 of things 1 above the other and this resolves when she closes 1 eye.  She is also had some dizziness and almost fell over when she tried to walk earlier.  No history of prior strokes.  No fall or head trauma.  She is not on anticoagulation.  No nausea or vomiting.  No weakness or numbness.  No chest pain or abdominal pain or difficulty breathing.     Home Medications Prior to Admission medications   Medication Sig Start Date End Date Taking? Authorizing Provider  Cholecalciferol (VITAMIN D3) 1.25 MG (50000 UT) TABS Take 1 tablet by mouth once a week. 07/23/22   Tollie Eth, NP  escitalopram (LEXAPRO) 20 MG tablet TAKE 1 TABLET(20 MG) BY MOUTH AT BEDTIME 07/22/22   Early, Sung Amabile, NP  HUMIRA PEN 40 MG/0.4ML PNKT SMARTSIG:40 Milligram(s) SUB-Q Every 2 Weeks 07/19/20   [provider]  hydroxychloroquine (PLAQUENIL) 200 MG tablet Take 1 tablet (200 mg total) by mouth 2 (two) times daily. 02/28/20   Just, Azalee Course, FNP  LORazepam (ATIVAN) 0.5 MG tablet Take 1 tablet (0.5 mg total) by mouth every 8 (eight) hours as needed for anxiety. Patient not taking: Reported on 07/22/2022 01/23/22   Early, Sung Amabile, NP  ondansetron (ZOFRAN) 8 MG tablet Take 1 tablet (8 mg total) by mouth every 8 (eight) hours as needed for nausea. 07/22/22   Tollie Eth, NP  Semaglutide-Weight Management (WEGOVY) 0.5 MG/0.5ML SOAJ Inject 0.5 mg into the skin once a week. 07/22/22   Tollie Eth, NP  Semaglutide-Weight Management (WEGOVY) 1 MG/0.5ML SOAJ Inject 1 mg into the skin once a week.  07/22/22   Tollie Eth, NP  Semaglutide-Weight Management (WEGOVY) 1.7 MG/0.75ML SOAJ Inject 1.7 mg into the skin once a week. 07/22/22   Tollie Eth, NP      Allergies    Kenalog [triamcinolone acetonide] and Amoxicillin    Review of Systems   Review of Systems Review of systems completed and notable as per HPI.  ROS otherwise negative.   Physical Exam Updated Vital Signs BP (!) 149/95 (BP Location: Left Arm)   Pulse 80   Temp 98.4 F (36.9 C)   Resp 18   Ht 5\' 6"  (1.676 m)   Wt 113.4 kg   LMP 11/02/2022   SpO2 97%   BMI 40.35 kg/m  Physical Exam Vitals and nursing note reviewed.  Constitutional:      General: She is in acute distress.     Appearance: She is well-developed.  HENT:     Head: Normocephalic and atraumatic.     Mouth/Throat:     Mouth: Mucous membranes are moist.     Pharynx: Oropharynx is clear.  Eyes:     Extraocular Movements: Extraocular movements intact.     Conjunctiva/sclera: Conjunctivae normal.     Pupils: Pupils are equal, round, and reactive to light.  Cardiovascular:     Rate and Rhythm: Normal rate and regular rhythm.     Heart sounds: No murmur  heard. Pulmonary:     Effort: Pulmonary effort is normal. No respiratory distress.     Breath sounds: Normal breath sounds.  Abdominal:     Palpations: Abdomen is soft.     Tenderness: There is no abdominal tenderness. There is no guarding or rebound.  Musculoskeletal:        General: No swelling.     Cervical back: Neck supple.     Right lower leg: No edema.     Left lower leg: No edema.  Skin:    General: Skin is warm and dry.     Capillary Refill: Capillary refill takes less than 2 seconds.  Neurological:     Mental Status: She is alert and oriented to person, place, and time.     Sensory: No sensory deficit.     Motor: No weakness.     Coordination: Coordination normal.     Deep Tendon Reflexes: Reflexes normal.     Comments: Patient awake and alert.  Oriented appropriately.  Her  cranial nerves are intact there is no visual field deficits but has binocular double vision.  With test of skew she has some vertical skew.  She has slight disconjugate gaze with extraocular movements.  Cranial nerves are otherwise intact.  She has good strength in all extremities and normal sensation throughout.  Normal finger-to-nose bilaterally.  Psychiatric:        Mood and Affect: Mood normal.     ED Results / Procedures / Treatments   Labs (all labs ordered are listed, but only abnormal results are displayed) Labs Reviewed  CBC - Abnormal; Notable for the following components:      Result Value   WBC 11.3 (*)    RBC 5.27 (*)    All other components within normal limits  DIFFERENTIAL - Abnormal; Notable for the following components:   Neutro Abs 7.9 (*)    All other components within normal limits  COMPREHENSIVE METABOLIC PANEL - Abnormal; Notable for the following components:   Glucose, Bld 103 (*)    Calcium 8.6 (*)    All other components within normal limits  URINALYSIS, ROUTINE W REFLEX MICROSCOPIC - Abnormal; Notable for the following components:   Specific Gravity, Urine >1.046 (*)    Hgb urine dipstick MODERATE (*)    Leukocytes,Ua LARGE (*)    Bacteria, UA FEW (*)    All other components within normal limits  I-STAT CHEM 8, ED - Abnormal; Notable for the following components:   Potassium 3.4 (*)    Calcium, Ion 1.10 (*)    All other components within normal limits  CBG MONITORING, ED - Abnormal; Notable for the following components:   Glucose-Capillary 108 (*)    All other components within normal limits  ETHANOL  PROTIME-INR  APTT  RAPID URINE DRUG SCREEN, HOSP PERFORMED  HCG, SERUM, QUALITATIVE  HIV ANTIBODY (ROUTINE TESTING W REFLEX)  LIPID PANEL  HEMOGLOBIN A1C    EKG EKG Interpretation Date/Time:  Tuesday November 04 2022 14:25:39 EDT Ventricular Rate:  80 PR Interval:  140 QRS Duration:  100 QT Interval:  388 QTC Calculation: 448 R  Axis:   24  Text Interpretation: Sinus rhythm Low voltage, precordial leads Confirmed by Fulton Reek [phone]) on 11/04/2022 4:33:57 PM  Radiology CT HEAD CODE STROKE WO CONTRAST  Result Date: 11/04/2022 CLINICAL DATA:  Code stroke. Sudden onset vision changes. Double vision in right eye. EXAM: CT ANGIOGRAPHY HEAD AND NECK CT PERFUSION BRAIN TECHNIQUE: Multidetector CT imaging of the head  and neck was performed using the standard protocol during bolus administration of intravenous contrast. Multiplanar CT image reconstructions and MIPs were obtained to evaluate the vascular anatomy. Carotid stenosis measurements (when applicable) are obtained utilizing NASCET criteria, using the distal internal carotid diameter as the denominator. Multiphase CT imaging of the brain was performed following IV bolus contrast injection. Subsequent parametric perfusion maps were calculated using RAPID software. RADIATION DOSE REDUCTION: This exam was performed according to the departmental dose-optimization program which includes automated exposure control, adjustment of the mA and/or kV according to patient size and/or use of iterative reconstruction technique. CONTRAST:  OMNIPAQUE IOHEXOL 350 MG/ML SOLN COMPARISON:  None Available. FINDINGS: CT HEAD FINDINGS Brain: There is no acute intracranial hemorrhage, extra-axial fluid collection, or acute infarct. Parenchymal volume is normal. The ventricles are normal in size. Gray-white differentiation is preserved The pituitary and suprasellar region are normal. There is no mass lesion. There is no mass effect or midline shift. Vascular: No hyperdense vessel or unexpected calcification. Skull: Normal. Negative for fracture or focal lesion. Sinuses/Orbits: The paranasal sinuses are clear. The globes and orbits are unremarkable. Other: The mastoid air cells and middle ear cavities are clear. ASPECTS [facility] Stroke Program Early CT Score) - Ganglionic level infarction (caudate,  lentiform nuclei, internal capsule, insula, M1-M3 cortex): 7 - Supraganglionic infarction (M4-M6 cortex): 3 Total score (0-10 with 10 being normal): 10 CTA NECK FINDINGS Aortic arch: The imaged aortic arch is normal. The origins of the major branch vessels are patent. The subclavian arteries are patent to the level imaged. Right carotid system: The right common, internal, and external carotid arteries are patent, without hemodynamically significant stenosis or occlusion there is no evidence of dissection or aneurysm. Left carotid system: The left common, internal, and external carotid arteries are patent, without hemodynamically significant stenosis or occlusion. There is no evidence of dissection or aneurysm. Vertebral arteries: The vertebral arteries are patent, without hemodynamically significant stenosis or occlusion there is no evidence of dissection or aneurysm. Skeleton: There is no acute osseous abnormality or suspicious osseous lesion. There is no visible canal hematoma. Other neck: The soft tissues of the neck are unremarkable. Upper chest: The imaged lung apices are clear. Review of the MIP images confirms the above findings CTA HEAD FINDINGS Anterior circulation: The intracranial ICAs are normal. The ophthalmic arteries are identified bilaterally. The bilateral MCAs and ACAS are normal. The anterior communicating artery is normal. There is no aneurysm or AVM. Posterior circulation: The bilateral V4 segments are normal. The basilar artery is normal. The major cerebellar arteries appear patent. The bilateral PCAs are normal. Bilateral posterior communicating arteries are identified with a predominantly fetal origin on the right. There is no aneurysm or AVM. Venous sinuses: Patent. Anatomic variants: As above. Review of the MIP images confirms the above findings CT Brain Perfusion Findings: ASPECTS: 10 CBF (<30%) Volume: 0mL Perfusion (Tmax>6.0s) volume: 0mL Mismatch Volume: 0mL Infarction Location:N/a  IMPRESSION: 1. Normal noncontrast head CT. 2. Normal CTA of the neck. 3. Negative CT perfusion. The results of the initial noncontrast head CT were called by telephone at the time of interpretation on 11/04/2022 at 2:51 pm to provider Dr Earlene Plater , who verbally acknowledged these results. Electronically Signed   By: Lesia Hausen M.D.   On: 11/04/2022 15:08   CT ANGIO HEAD NECK W WO CM W PERF (CODE STROKE)  Result Date: 11/04/2022 CLINICAL DATA:  Code stroke. Sudden onset vision changes. Double vision in right eye. EXAM: CT ANGIOGRAPHY HEAD AND NECK  CT PERFUSION BRAIN TECHNIQUE: Multidetector CT imaging of the head and neck was performed using the standard protocol during bolus administration of intravenous contrast. Multiplanar CT image reconstructions and MIPs were obtained to evaluate the vascular anatomy. Carotid stenosis measurements (when applicable) are obtained utilizing NASCET criteria, using the distal internal carotid diameter as the denominator. Multiphase CT imaging of the brain was performed following IV bolus contrast injection. Subsequent parametric perfusion maps were calculated using RAPID software. RADIATION DOSE REDUCTION: This exam was performed according to the departmental dose-optimization program which includes automated exposure control, adjustment of the mA and/or kV according to patient size and/or use of iterative reconstruction technique. CONTRAST:  OMNIPAQUE IOHEXOL 350 MG/ML SOLN COMPARISON:  None Available. FINDINGS: CT HEAD FINDINGS Brain: There is no acute intracranial hemorrhage, extra-axial fluid collection, or acute infarct. Parenchymal volume is normal. The ventricles are normal in size. Gray-white differentiation is preserved The pituitary and suprasellar region are normal. There is no mass lesion. There is no mass effect or midline shift. Vascular: No hyperdense vessel or unexpected calcification. Skull: Normal. Negative for fracture or focal lesion. Sinuses/Orbits: The  paranasal sinuses are clear. The globes and orbits are unremarkable. Other: The mastoid air cells and middle ear cavities are clear. ASPECTS [facility] Stroke Program Early CT Score) - Ganglionic level infarction (caudate, lentiform nuclei, internal capsule, insula, M1-M3 cortex): 7 - Supraganglionic infarction (M4-M6 cortex): 3 Total score (0-10 with 10 being normal): 10 CTA NECK FINDINGS Aortic arch: The imaged aortic arch is normal. The origins of the major branch vessels are patent. The subclavian arteries are patent to the level imaged. Right carotid system: The right common, internal, and external carotid arteries are patent, without hemodynamically significant stenosis or occlusion there is no evidence of dissection or aneurysm. Left carotid system: The left common, internal, and external carotid arteries are patent, without hemodynamically significant stenosis or occlusion. There is no evidence of dissection or aneurysm. Vertebral arteries: The vertebral arteries are patent, without hemodynamically significant stenosis or occlusion there is no evidence of dissection or aneurysm. Skeleton: There is no acute osseous abnormality or suspicious osseous lesion. There is no visible canal hematoma. Other neck: The soft tissues of the neck are unremarkable. Upper chest: The imaged lung apices are clear. Review of the MIP images confirms the above findings CTA HEAD FINDINGS Anterior circulation: The intracranial ICAs are normal. The ophthalmic arteries are identified bilaterally. The bilateral MCAs and ACAS are normal. The anterior communicating artery is normal. There is no aneurysm or AVM. Posterior circulation: The bilateral V4 segments are normal. The basilar artery is normal. The major cerebellar arteries appear patent. The bilateral PCAs are normal. Bilateral posterior communicating arteries are identified with a predominantly fetal origin on the right. There is no aneurysm or AVM. Venous sinuses: Patent. Anatomic  variants: As above. Review of the MIP images confirms the above findings CT Brain Perfusion Findings: ASPECTS: 10 CBF (<30%) Volume: 0mL Perfusion (Tmax>6.0s) volume: 0mL Mismatch Volume: 0mL Infarction Location:N/a IMPRESSION: 1. Normal noncontrast head CT. 2. Normal CTA of the neck. 3. Negative CT perfusion. The results of the initial noncontrast head CT were called by telephone at the time of interpretation on 11/04/2022 at 2:51 pm to provider Dr Earlene Plater , who verbally acknowledged these results. Electronically Signed   By: Lesia Hausen M.D.   On: 11/04/2022 15:08    Procedures Procedures    Medications Ordered in ED Medications  escitalopram (LEXAPRO) tablet 20 mg (has no administration in time range)  stroke: early stages of recovery book (has no administration in time range)  0.9 %  sodium chloride infusion (has no administration in time range)  acetaminophen (TYLENOL) tablet 650 mg (has no administration in time range)    Or  acetaminophen (TYLENOL) 160 MG/5ML solution 650 mg (has no administration in time range)    Or  acetaminophen (TYLENOL) suppository 650 mg (has no administration in time range)  senna-docusate (Senokot-S) tablet 1 tablet (has no administration in time range)  iohexol (OMNIPAQUE) 350 MG/ML injection 100 mL (100 mLs Intravenous Contrast Given 11/04/22 1453)  metoCLOPramide (REGLAN) injection 10 mg (10 mg Intravenous Given 11/04/22 1705)  gadobutrol (GADAVIST) 1 MMOL/ML injection 10 mL (10 mLs Intravenous Contrast Given 11/04/22 1811)    ED Course/ Medical Decision Making/ A&P                             Medical Decision Making Amount and/or Complexity of Data Reviewed Radiology: ordered.  Risk Prescription drug management. Decision regarding hospitalization.   Medical Decision Making:   LAVONN MAXCY is a 37 y.o. female who presented to the ED today with sudden onset double vision, dizziness.  Vital signs reviewed notable for hypertension.  Patient was  initially evaluated by PA, code stroke was initiated.  My examination I am concerned for possible brainstem stroke given sudden onset diplopia, vertical skew, dizziness.  She is not anticoagulated.  She is within the tPA window.  Could stroke was activated, I personally placed patient's IV to get her to CT scan for CT and CTA.  She has no signs of hemorrhagic stroke or LVO.  Teleneurology stroke service evaluated patient.  They did not see any indication for TNK.  Her lab work is notable for mild leukocytosis.  She has no signs of CNS infection or other infection.  Discussed again with Dr. Otelia Limes with neurology.  He recommends MR brain and orbits for further evaluation.  Patient updated on plan.  She denies any history of migraine, she has a mild frontal headache right now but does not want a medication for this.  MRI is pending.   Patient placed on continuous vitals and telemetry monitoring while in ED which was reviewed periodically.  Reviewed and confirmed nursing documentation for past medical history, family history, social history.   Reassessment and Plan:   Patient remained stable, she has mild frontal headache.  No fever or signs of CNS infection.  Diplopia is slightly improved.  MRI is pending, discussed with neurology again who recommends admission to [facility].  Per Dr. Otelia Limes, she may need LP after MRI which is still pending but need admission to [facility] regardless.  Discussed this plan with Dr. Antionette Char and admitted for further management.  Family updated on plan.  I was contacted by Dr. Wilford Corner with neurology.  He emphasized need for LP after MRI is completed which is still pending.  I have already talked with the hospitalist and she has bed available at [facility].  Discussed this with Dr. Wilford Corner to clarify given I was previously told patient needs to be admitted at [facility] regardless and LP needs to be after MRI.  Dr. Wilford Corner was okay with patient transferring to [facility] for admission to hospitalist prior to LP being  performed.   Patient's presentation is most consistent with acute presentation with potential threat to life or bodily function.           Final Clinical Impression(s) / ED Diagnoses  Final diagnoses:  Diplopia    Rx / DC Orders ED Discharge Orders     None         Laurence Spates, MD 11/04/22 1737    Laurence Spates, MD 11/04/22 [phone]

## 2022-11-04 NOTE — ED Notes (Signed)
ED TO INPATIENT HANDOFF REPORT  ED Nurse Name and Phone #: Linus Orn Name/Age/Gender Laura [name] 37 y.o. female Room/Bed: RESB/RESB  Code Status   Code Status: Full Code  Home/SNF/Other Home Patient oriented to: self, place, time, and situation Is this baseline? Yes   Triage Complete: Triage complete  Chief Complaint Vision disturbance [H53.9]  Triage Note Pt c/o blurred and double vision since 1130. Code stroke called in triage   Allergies Allergies  Allergen Reactions   Kenalog [Triamcinolone Acetonide]     Rash noted after ankle injection by rheumatologist   Amoxicillin Rash    Level of Care/Admitting Diagnosis ED Disposition     ED Disposition  Admit   Condition  --   Comment  Hospital Area: [facility] [[id number]]  Level of Care: Telemetry Medical [104]  May place patient in observation at [facility] or [facility] if equivalent level of care is available:: No  Covid Evaluation: Asymptomatic - no recent exposure (last 10 days) testing not required  Diagnosis: Vision disturbance [161096]  Admitting Physician: Briscoe Deutscher [[id number]]  Attending Physician: Briscoe Deutscher [[id number]]          B Medical/Surgery History Past Medical History:  Diagnosis Date   Anemia    Anxiety    Anxiety 12/31/2018   Arthritis    Depression    Depression    Phreesia 02/25/2020   Depressive disorder 03/28/2014   Establishing care with new doctor, encounter for 07/30/2020   Flatulence, eructation and gas pain 07/30/2020   Frequent headaches    History of chicken pox    Otitis media    had tubes as child, then according to pt adipose tissue used to repair ear drum   Rheumatoid arthritis (HCC)    UTI (lower urinary tract infection)    Past Surgical History:  Procedure Laterality Date   APPENDECTOMY  2012   HYMENECTOMY  2000   TONSILECTOMY, ADENOIDECTOMY, BILATERAL MYRINGOTOMY AND TUBES       A IV Location/Drains/Wounds Patient  Lines/Drains/Airways Status     Active Line/Drains/Airways     Name Placement date Placement time Site Days   Peripheral IV 10/05/20 22 G Posterior;Right Hand 10/05/20  1532  Hand  760   Peripheral IV 11/04/22 18 G Anterior;Right Forearm 11/04/22  1441  Forearm  less than 1            Intake/Output Last 24 hours No intake or output data in the 24 hours ending 11/04/22 1753  Labs/Imaging Results for orders placed or performed during the hospital encounter of 11/04/22 (from the past 48 hour(s))  CBG monitoring, ED     Status: Abnormal   Collection Time: 11/04/22  2:27 PM  Result Value Ref Range   Glucose-Capillary 108 (H) 70 - 99 mg/dL    Comment: Glucose reference range applies only to samples taken after fasting for at least 8 hours.  Ethanol     Status: None   Collection Time: 11/04/22  2:35 PM  Result Value Ref Range   Alcohol, Ethyl (B) <10 <10 mg/dL    Comment: (NOTE) Lowest detectable limit for serum alcohol is 10 mg/dL.  For medical purposes only. Performed at [facility], [address], [place], [place] [address]   Protime-INR     Status: None   Collection Time: 11/04/22  2:35 PM  Result Value Ref Range   Prothrombin Time 13.2 11.4 - 15.2 seconds   INR 1.0 0.8 - 1.2  Comment: (NOTE) INR goal varies based on device and disease states. Performed at [facility], [address] [place], [place] [address]   APTT     Status: None   Collection Time: 11/04/22  2:35 PM  Result Value Ref Range   aPTT 30 24 - 36 seconds    Comment: Performed at [facility], [address], [place], [place] [address]  CBC     Status: Abnormal   Collection Time: 11/04/22  2:35 PM  Result Value Ref Range   WBC 11.3 (H) 4.0 - 10.5 K/uL   RBC 5.27 (H) 3.87 - 5.11 MIL/uL   Hemoglobin 13.8 12.0 - 15.0 g/dL   HCT 57.8 46.9 - 62.9 %   MCV 84.1 80.0 - 100.0 fL   MCH 26.2 26.0 - 34.0 pg   MCHC 31.2 30.0 - 36.0 g/dL   RDW 52.8  41.3 - 24.4 %   Platelets 320 150 - 400 K/uL   nRBC 0.0 0.0 - 0.2 %    Comment: Performed at [facility], [address], [place], [place] [address]  Differential     Status: Abnormal   Collection Time: 11/04/22  2:35 PM  Result Value Ref Range   Neutrophils Relative % 70 %   Neutro Abs 7.9 (H) 1.7 - 7.7 K/uL   Lymphocytes Relative 22 %   Lymphs Abs 2.5 0.7 - 4.0 K/uL   Monocytes Relative 6 %   Monocytes Absolute 0.7 0.1 - 1.0 K/uL   Eosinophils Relative 1 %   Eosinophils Absolute 0.1 0.0 - 0.5 K/uL   Basophils Relative 1 %   Basophils Absolute 0.1 0.0 - 0.1 K/uL   Immature Granulocytes 0 %   Abs Immature Granulocytes 0.05 0.00 - 0.07 K/uL    Comment: Performed at [facility], [address] [place], [place] [address]  Comprehensive metabolic panel     Status: Abnormal   Collection Time: 11/04/22  2:35 PM  Result Value Ref Range   Sodium 135 135 - 145 mmol/L   Potassium 3.7 3.5 - 5.1 mmol/L   Chloride 105 98 - 111 mmol/L   CO2 23 22 - 32 mmol/L   Glucose, Bld 103 (H) 70 - 99 mg/dL    Comment: Glucose reference range applies only to samples taken after fasting for at least 8 hours.   BUN 12 6 - 20 mg/dL   Creatinine, Ser 4.40 0.44 - 1.00 mg/dL   Calcium 8.6 (L) 8.9 - 10.3 mg/dL   Total Protein 7.6 6.5 - 8.1 g/dL   Albumin 3.9 3.5 - 5.0 g/dL   AST 18 15 - 41 U/L   ALT 22 0 - 44 U/L   Alkaline Phosphatase 79 38 - 126 U/L   Total Bilirubin 0.6 0.3 - 1.2 mg/dL   GFR, Estimated >34 >74 mL/min    Comment: (NOTE) Calculated using the CKD-EPI Creatinine Equation (2021)    Anion gap 7 5 - 15    Comment: Performed at [facility], [address] [place], [place] [address]  hCG, serum, qualitative     Status: None   Collection Time: 11/04/22  2:35 PM  Result Value Ref Range   Preg, Serum NEGATIVE NEGATIVE    Comment:        THE SENSITIVITY OF THIS METHODOLOGY IS >10 mIU/mL. Performed at [facility],  [address], [place], [place] [address]   I-stat chem 8, ED  Status: Abnormal   Collection Time: 11/04/22  3:20 PM  Result Value Ref Range   Sodium 138 135 - 145 mmol/L   Potassium 3.4 (L) 3.5 - 5.1 mmol/L   Chloride 105 98 - 111 mmol/L   BUN 11 6 - 20 mg/dL   Creatinine, Ser 1.61 0.44 - 1.00 mg/dL   Glucose, Bld 88 70 - 99 mg/dL    Comment: Glucose reference range applies only to samples taken after fasting for at least 8 hours.   Calcium, Ion 1.10 (L) 1.15 - 1.40 mmol/L   TCO2 22 22 - 32 mmol/L   Hemoglobin 13.3 12.0 - 15.0 g/dL   HCT 09.6 04.5 - 40.9 %  Urine rapid drug screen (hosp performed)     Status: None   Collection Time: 11/04/22  4:04 PM  Result Value Ref Range   Opiates NONE DETECTED NONE DETECTED   Cocaine NONE DETECTED NONE DETECTED   Benzodiazepines NONE DETECTED NONE DETECTED   Amphetamines NONE DETECTED NONE DETECTED   Tetrahydrocannabinol NONE DETECTED NONE DETECTED   Barbiturates NONE DETECTED NONE DETECTED    Comment: (NOTE) DRUG SCREEN FOR MEDICAL PURPOSES ONLY.  IF CONFIRMATION IS NEEDED FOR ANY PURPOSE, NOTIFY LAB WITHIN 5 DAYS.  LOWEST DETECTABLE LIMITS FOR URINE DRUG SCREEN Drug Class                     Cutoff (ng/mL) Amphetamine and metabolites    1000 Barbiturate and metabolites    200 Benzodiazepine                 200 Opiates and metabolites        300 Cocaine and metabolites        300 THC                            50 Performed at [facility], [address], [place], [place] [address]   Urinalysis, Routine w reflex microscopic -Urine, Clean Catch     Status: Abnormal   Collection Time: 11/04/22  4:07 PM  Result Value Ref Range   Color, Urine YELLOW YELLOW   APPearance CLEAR CLEAR   Specific Gravity, Urine >1.046 (H) 1.005 - 1.030   pH 6.0 5.0 - 8.0   Glucose, UA NEGATIVE NEGATIVE mg/dL   Hgb urine dipstick MODERATE (A) NEGATIVE   Bilirubin Urine NEGATIVE NEGATIVE   Ketones, ur NEGATIVE NEGATIVE mg/dL    Protein, ur NEGATIVE NEGATIVE mg/dL   Nitrite NEGATIVE NEGATIVE   Leukocytes,Ua LARGE (A) NEGATIVE   RBC / HPF 11-20 0 - 5 RBC/hpf   WBC, UA 21-50 0 - 5 WBC/hpf   Bacteria, UA FEW (A) NONE SEEN   Squamous Epithelial / HPF 0-5 0 - 5 /HPF   Mucus PRESENT     Comment: Performed at [facility], [address], [place], [place] [address]   CT HEAD CODE STROKE WO CONTRAST  Result Date: 11/04/2022 CLINICAL DATA:  Code stroke. Sudden onset vision changes. Double vision in right eye. EXAM: CT ANGIOGRAPHY HEAD AND NECK CT PERFUSION BRAIN TECHNIQUE: Multidetector CT imaging of the head and neck was performed using the standard protocol during bolus administration of intravenous contrast. Multiplanar CT image reconstructions and MIPs were obtained to evaluate the vascular anatomy. Carotid stenosis measurements (when applicable) are obtained utilizing NASCET criteria, using the distal internal carotid diameter as the denominator. Multiphase CT imaging of the brain was performed following  IV bolus contrast injection. Subsequent parametric perfusion maps were calculated using RAPID software. RADIATION DOSE REDUCTION: This exam was performed according to the departmental dose-optimization program which includes automated exposure control, adjustment of the mA and/or kV according to patient size and/or use of iterative reconstruction technique. CONTRAST:  OMNIPAQUE IOHEXOL 350 MG/ML SOLN COMPARISON:  None Available. FINDINGS: CT HEAD FINDINGS Brain: There is no acute intracranial hemorrhage, extra-axial fluid collection, or acute infarct. Parenchymal volume is normal. The ventricles are normal in size. Gray-white differentiation is preserved The pituitary and suprasellar region are normal. There is no mass lesion. There is no mass effect or midline shift. Vascular: No hyperdense vessel or unexpected calcification. Skull: Normal. Negative for fracture or focal lesion. Sinuses/Orbits: The  paranasal sinuses are clear. The globes and orbits are unremarkable. Other: The mastoid air cells and middle ear cavities are clear. ASPECTS [facility] Stroke Program Early CT Score) - Ganglionic level infarction (caudate, lentiform nuclei, internal capsule, insula, M1-M3 cortex): 7 - Supraganglionic infarction (M4-M6 cortex): 3 Total score (0-10 with 10 being normal): 10 CTA NECK FINDINGS Aortic arch: The imaged aortic arch is normal. The origins of the major branch vessels are patent. The subclavian arteries are patent to the level imaged. Right carotid system: The right common, internal, and external carotid arteries are patent, without hemodynamically significant stenosis or occlusion there is no evidence of dissection or aneurysm. Left carotid system: The left common, internal, and external carotid arteries are patent, without hemodynamically significant stenosis or occlusion. There is no evidence of dissection or aneurysm. Vertebral arteries: The vertebral arteries are patent, without hemodynamically significant stenosis or occlusion there is no evidence of dissection or aneurysm. Skeleton: There is no acute osseous abnormality or suspicious osseous lesion. There is no visible canal hematoma. Other neck: The soft tissues of the neck are unremarkable. Upper chest: The imaged lung apices are clear. Review of the MIP images confirms the above findings CTA HEAD FINDINGS Anterior circulation: The intracranial ICAs are normal. The ophthalmic arteries are identified bilaterally. The bilateral MCAs and ACAS are normal. The anterior communicating artery is normal. There is no aneurysm or AVM. Posterior circulation: The bilateral V4 segments are normal. The basilar artery is normal. The major cerebellar arteries appear patent. The bilateral PCAs are normal. Bilateral posterior communicating arteries are identified with a predominantly fetal origin on the right. There is no aneurysm or AVM. Venous sinuses: Patent. Anatomic  variants: As above. Review of the MIP images confirms the above findings CT Brain Perfusion Findings: ASPECTS: 10 CBF (<30%) Volume: 0mL Perfusion (Tmax>6.0s) volume: 0mL Mismatch Volume: 0mL Infarction Location:N/a IMPRESSION: 1. Normal noncontrast head CT. 2. Normal CTA of the neck. 3. Negative CT perfusion. The results of the initial noncontrast head CT were called by telephone at the time of interpretation on 11/04/2022 at 2:51 pm to provider Dr Earlene Plater , who verbally acknowledged these results. Electronically Signed   By: Lesia Hausen M.D.   On: 11/04/2022 15:08   CT ANGIO HEAD NECK W WO CM W PERF (CODE STROKE)  Result Date: 11/04/2022 CLINICAL DATA:  Code stroke. Sudden onset vision changes. Double vision in right eye. EXAM: CT ANGIOGRAPHY HEAD AND NECK CT PERFUSION BRAIN TECHNIQUE: Multidetector CT imaging of the head and neck was performed using the standard protocol during bolus administration of intravenous contrast. Multiplanar CT image reconstructions and MIPs were obtained to evaluate the vascular anatomy. Carotid stenosis measurements (when applicable) are obtained utilizing NASCET criteria, using the distal internal carotid diameter as the  denominator. Multiphase CT imaging of the brain was performed following IV bolus contrast injection. Subsequent parametric perfusion maps were calculated using RAPID software. RADIATION DOSE REDUCTION: This exam was performed according to the departmental dose-optimization program which includes automated exposure control, adjustment of the mA and/or kV according to patient size and/or use of iterative reconstruction technique. CONTRAST:  OMNIPAQUE IOHEXOL 350 MG/ML SOLN COMPARISON:  None Available. FINDINGS: CT HEAD FINDINGS Brain: There is no acute intracranial hemorrhage, extra-axial fluid collection, or acute infarct. Parenchymal volume is normal. The ventricles are normal in size. Gray-white differentiation is preserved The pituitary and suprasellar  region are normal. There is no mass lesion. There is no mass effect or midline shift. Vascular: No hyperdense vessel or unexpected calcification. Skull: Normal. Negative for fracture or focal lesion. Sinuses/Orbits: The paranasal sinuses are clear. The globes and orbits are unremarkable. Other: The mastoid air cells and middle ear cavities are clear. ASPECTS [facility] Stroke Program Early CT Score) - Ganglionic level infarction (caudate, lentiform nuclei, internal capsule, insula, M1-M3 cortex): 7 - Supraganglionic infarction (M4-M6 cortex): 3 Total score (0-10 with 10 being normal): 10 CTA NECK FINDINGS Aortic arch: The imaged aortic arch is normal. The origins of the major branch vessels are patent. The subclavian arteries are patent to the level imaged. Right carotid system: The right common, internal, and external carotid arteries are patent, without hemodynamically significant stenosis or occlusion there is no evidence of dissection or aneurysm. Left carotid system: The left common, internal, and external carotid arteries are patent, without hemodynamically significant stenosis or occlusion. There is no evidence of dissection or aneurysm. Vertebral arteries: The vertebral arteries are patent, without hemodynamically significant stenosis or occlusion there is no evidence of dissection or aneurysm. Skeleton: There is no acute osseous abnormality or suspicious osseous lesion. There is no visible canal hematoma. Other neck: The soft tissues of the neck are unremarkable. Upper chest: The imaged lung apices are clear. Review of the MIP images confirms the above findings CTA HEAD FINDINGS Anterior circulation: The intracranial ICAs are normal. The ophthalmic arteries are identified bilaterally. The bilateral MCAs and ACAS are normal. The anterior communicating artery is normal. There is no aneurysm or AVM. Posterior circulation: The bilateral V4 segments are normal. The basilar artery is normal. The major cerebellar  arteries appear patent. The bilateral PCAs are normal. Bilateral posterior communicating arteries are identified with a predominantly fetal origin on the right. There is no aneurysm or AVM. Venous sinuses: Patent. Anatomic variants: As above. Review of the MIP images confirms the above findings CT Brain Perfusion Findings: ASPECTS: 10 CBF (<30%) Volume: 0mL Perfusion (Tmax>6.0s) volume: 0mL Mismatch Volume: 0mL Infarction Location:N/a IMPRESSION: 1. Normal noncontrast head CT. 2. Normal CTA of the neck. 3. Negative CT perfusion. The results of the initial noncontrast head CT were called by telephone at the time of interpretation on 11/04/2022 at 2:51 pm to provider Dr Earlene Plater , who verbally acknowledged these results. Electronically Signed   By: Lesia Hausen M.D.   On: 11/04/2022 15:08    Pending Labs Unresulted Labs (From admission, onward)     Start     Ordered   11/05/22 0500  HIV Antibody (routine testing w rflx)  (HIV Antibody (Routine testing w reflex) panel)  Tomorrow morning,   R        11/04/22 1715   11/05/22 0500  Lipid panel  (Labs)  Tomorrow morning,   R       Comments: Fasting    11/04/22 1715  11/05/22 0500  Hemoglobin A1c  (Labs)  Tomorrow morning,   R       Comments: To assess prior glycemic control    11/04/22 1715            Vitals/Pain Today's Vitals   11/04/22 1357 11/04/22 1425 11/04/22 1707  BP: (!) 149/95    Pulse: 80    Resp: 18    Temp: 98.3 F (36.8 C)    TempSrc: Oral    SpO2: 97%    Weight:  113.4 kg   Height:  5\' 6"  (1.676 m)   PainSc:  0-No pain 7     Isolation Precautions No active isolations  Medications Medications  escitalopram (LEXAPRO) tablet 20 mg (has no administration in time range)   stroke: early stages of recovery book (has no administration in time range)  0.9 %  sodium chloride infusion (has no administration in time range)  acetaminophen (TYLENOL) tablet 650 mg (has no administration in time range)    Or  acetaminophen  (TYLENOL) 160 MG/5ML solution 650 mg (has no administration in time range)    Or  acetaminophen (TYLENOL) suppository 650 mg (has no administration in time range)  senna-docusate (Senokot-S) tablet 1 tablet (has no administration in time range)  gadobutrol (GADAVIST) 1 MMOL/ML injection 10 mL (has no administration in time range)  iohexol (OMNIPAQUE) 350 MG/ML injection 100 mL (100 mLs Intravenous Contrast Given 11/04/22 1453)  metoCLOPramide (REGLAN) injection 10 mg (10 mg Intravenous Given 11/04/22 1705)    Mobility walks     Focused Assessments Cardiac Assessment Handoff:    Lab Results  Component Value Date   TROPONINI 0.03 (HH) 06/25/2016   No results found for: "DDIMER" Does the Patient currently have chest pain? No    R Recommendations: See Admitting Provider Note  Report given to:   Additional Notes: Aaox4, mother and father at bedside, pt ambulates, symptoms first started 38am. NIH assessment completed by nurse.

## 2022-11-04 NOTE — Progress Notes (Signed)
1421 Stroke cart activated and elert sent to [facility]. EDP Tiburcio Pea, PA) at bedside assessing pt. Pt presents to ED with c/o sudden onset of blurry/double vision in R eye with LKW 1130. 1437 pt to CT 1445 Dr. Otelia Limes with [facility] paged. 1453 Dr. Otelia Limes connected via stroke cart. SBAR given by [facility] at this time. 1455 pt back from CT and Dr. Otelia Limes assessing pt at this time.

## 2022-11-04 NOTE — ED Notes (Signed)
Pt passed swallow test.

## 2022-11-04 NOTE — ED Triage Notes (Signed)
Pt c/o blurred and double vision since 1130. Code stroke called in triage

## 2022-11-04 NOTE — ED Notes (Signed)
[facility] has been contacted

## 2022-11-04 NOTE — Consult Note (Addendum)
[facility] TeleNeurology Consult Services    Date of Service:  11/04/2022      Metrics: Last Known Well: 1130 Symptoms: As per HPI.  Patient is not a candidate for thrombolytic. Symptoms too mild to treat. Risks of TNK significantly outweigh potential benefits.   Location of the provider: [facility]  Location of the patient: [facility] Pre-Morbid Modified Rankin Scale: 0 Time Code Stroke Page received:  2:46 PM Time neurologist arrived:  2:52 PM Time NIHSS completed: 3:16 PM    This consult was provided via telemedicine with 2-way video and audio communication. The patient/family was informed that care would be provided in this way and agreed to receive care in this manner.   ED Physician notified of diagnostic impression and management plan at 3:20 PM   Assessment: 37 year old female presenting with acute onset of binocular double vision.   -Exam reveals symptomatic double vision in all directions of gaze, with intorsion, slight medial deviation and decreased elevation of the left eye relative to the right during upgaze, as well as medial deviation of the left eye during downgaze.  - CT head: Normal noncontrast head CT. - CTA of the head and neck: Normal appearance of the intracranial and extracranial vessels. No LVO.  - Negative CT perfusion. - DDx: Exam findings may localize as a small brainstem lesion, dysfunction of the lateral rectus muscle on the left or the the left abducens nerve. If a central lesion, a small stroke is possible but felt to be unlikely. Given her history of inflammatory disease, a demyelinating lesion is also possible.  - Risks of TNK are felt to significantly outweigh potential benefits given the broad differential diagnosis and relatively mild symptoms. This was discussed with the patient in the context of her negative CTA and no history of stroke risk factors such as MI, PVD, blood clots or miscarriages. An approximate 2.5%  risk of acute severe disabling hemorrhage secondary to TNK was also discussed. The patient expressed understanding and agreed that she did not want to undergo risk of TNK in the context of clinical features and history being most consistent with a low probability of stroke as the etiology.       Recommendations: - MRI brain and orbits with and without contrast. - Lumbar puncture for protein, glucose, cell count with differential, oligoclonal bands and IgG index.  - Recently obtained vitamin D level was low. She is not on any supplementation. Vitamin D supplementation is recommended.  - Discussed DDx and recommendations for MRI and LP with EDP.        ------------------------------------------------------------------------------   History of Present Illness: The patient is a 37 year old female with a PMHx of anemia, depression, frequent headaches, otitis media and rheumatoid arthritis who presents to the [facility] ED after acute onset of double vision at home this morning. She felt normal on awakening at 11:15 AM. At 11:30 AM she experienced acute onset of binocular double vision. When closing either eye the double vision resolves. No oscillopsia. Her double vision is constant and does not change when looking to the left, right, up or down. Does not endorse any eye pain. No vision loss. She also felt dizzy and almost fell over when she tried to walk. She has not had any vertigo, just a sense of unsteadiness. She has not experienced any nausea or vomiting. She denies seeing any facial droop when looking in the mirror, or feeling any facial weakness. She denies any limb weakness  or numbness. No aphasia, dysarthria or confusion. Vitals on arrival to the ED: BP 148/95, CBG 108. Code Stroke was called in the ED.    On interview she also endorses a 4/10 headache at the base of her skull and also involving her bilateral forehead. It is nonthrobbing, improves with massage and is not associated with photophobia.    Past Medical History: Past Medical History:  Diagnosis Date   Anemia    Anxiety    Anxiety 12/31/2018   Arthritis    Depression    Depression    [employer] 02/25/2020   Depressive disorder 03/28/2014   Establishing care with new doctor, encounter for 07/30/2020   Flatulence, eructation and gas pain 07/30/2020   Frequent headaches    History of chicken pox    Otitis media    had tubes as child, then according to pt adipose tissue used to repair ear drum   Rheumatoid arthritis (HCC)    UTI (lower urinary tract infection)       Past Surgical History: Past Surgical History:  Procedure Laterality Date   APPENDECTOMY  2012   HYMENECTOMY  2000   TONSILECTOMY, ADENOIDECTOMY, BILATERAL MYRINGOTOMY AND TUBES         Medications:  No current facility-administered medications on file prior to encounter.   Current Outpatient Medications on File Prior to Encounter  Medication Sig Dispense Refill   Cholecalciferol (VITAMIN D3) 1.25 MG (50000 UT) TABS Take 1 tablet by mouth once a week. 12 tablet 1   escitalopram (LEXAPRO) 20 MG tablet TAKE 1 TABLET(20 MG) BY MOUTH AT BEDTIME 90 tablet 3   HUMIRA PEN 40 MG/0.4ML PNKT SMARTSIG:40 Milligram(s) SUB-Q Every 2 Weeks     hydroxychloroquine (PLAQUENIL) 200 MG tablet Take 1 tablet (200 mg total) by mouth 2 (two) times daily. 60 tablet 0   LORazepam (ATIVAN) 0.5 MG tablet Take 1 tablet (0.5 mg total) by mouth every 8 (eight) hours as needed for anxiety. (Patient not taking: Reported on 07/22/2022) 20 tablet 0   ondansetron (ZOFRAN) 8 MG tablet Take 1 tablet (8 mg total) by mouth every 8 (eight) hours as needed for nausea. 30 tablet 2   Semaglutide-Weight Management (WEGOVY) 0.5 MG/0.5ML SOAJ Inject 0.5 mg into the skin once a week. 2 mL 0   Semaglutide-Weight Management (WEGOVY) 1 MG/0.5ML SOAJ Inject 1 mg into the skin once a week. 2 mL 0   Semaglutide-Weight Management (WEGOVY) 1.7 MG/0.75ML SOAJ Inject 1.7 mg into the skin once a week. 3 mL 0          Social History: Drug Use: None   Family History:  Reviewed in Epic   ROS: As per HPI    Anticoagulant use:  None   Antiplatelet use: None   Examination:    BP (!) 149/95 (BP Location: Left Arm)   Pulse 80   Temp 98.3 F (36.8 C) (Oral)   Resp 18   Ht 5\' 6"  (1.676 m)   Wt 113.4 kg   LMP 11/02/2022   SpO2 97%   BMI 40.35 kg/m     1A: Level of Consciousness - 0 1B: Ask Month and Age - 0 1C: Blink Eyes & Squeeze Hands - 0 2: Test Horizontal Extraocular Movements - 1 3: Test Visual Fields - 0 4: Test Facial Palsy (Use Grimace if Obtunded) - 0 5A: Test Left Arm Motor Drift - 0 5B: Test Right Arm Motor Drift - 0 6A: Test Left Leg Motor Drift - 0 6B: Test Right  Leg Motor Drift - 0 7: Test Limb Ataxia (FNF/Heel-Shin) - 0 8: Test Sensation -  0 9: Test Language/Aphasia - 0 10: Test Dysarthria - Severe Dysarthria: 0 11: Test Extinction/Inattention - Extinction to bilateral simultaneous stimulation:  0   NIHSS Score: 1     Patient/Family was informed the Neurology Consult would occur via TeleHealth consult by way of interactive audio and video telecommunications and consented to receiving care in this manner.   Patient is being evaluated for possible acute neurologic impairment and high pretest probability of imminent or life-threatening deterioration. I spent total of 40 minutes providing care to this patient, including time for face to face visit via telemedicine, review of medical records, imaging studies and discussion of findings with providers, the patient and/or family.   Electronically signed: Dr. Caryl Pina

## 2022-11-05 ENCOUNTER — Observation Stay (HOSPITAL_COMMUNITY): Payer: Managed Care, Other (non HMO)

## 2022-11-05 ENCOUNTER — Other Ambulatory Visit (HOSPITAL_COMMUNITY): Payer: Managed Care, Other (non HMO)

## 2022-11-05 DIAGNOSIS — H532 Diplopia: Secondary | ICD-10-CM | POA: Diagnosis present

## 2022-11-05 DIAGNOSIS — F411 Generalized anxiety disorder: Secondary | ICD-10-CM | POA: Diagnosis present

## 2022-11-05 DIAGNOSIS — G464 Cerebellar stroke syndrome: Secondary | ICD-10-CM

## 2022-11-05 DIAGNOSIS — F32A Depression, unspecified: Secondary | ICD-10-CM | POA: Diagnosis present

## 2022-11-05 DIAGNOSIS — Z7985 Long-term (current) use of injectable non-insulin antidiabetic drugs: Secondary | ICD-10-CM | POA: Diagnosis not present

## 2022-11-05 DIAGNOSIS — E782 Mixed hyperlipidemia: Secondary | ICD-10-CM | POA: Diagnosis not present

## 2022-11-05 DIAGNOSIS — Z888 Allergy status to other drugs, medicaments and biological substances status: Secondary | ICD-10-CM | POA: Diagnosis not present

## 2022-11-05 DIAGNOSIS — Z6841 Body Mass Index (BMI) 40.0 and over, adult: Secondary | ICD-10-CM | POA: Diagnosis not present

## 2022-11-05 DIAGNOSIS — H539 Unspecified visual disturbance: Secondary | ICD-10-CM

## 2022-11-05 DIAGNOSIS — I6389 Other cerebral infarction: Secondary | ICD-10-CM

## 2022-11-05 DIAGNOSIS — I253 Aneurysm of heart: Secondary | ICD-10-CM | POA: Diagnosis present

## 2022-11-05 DIAGNOSIS — M069 Rheumatoid arthritis, unspecified: Secondary | ICD-10-CM | POA: Diagnosis present

## 2022-11-05 DIAGNOSIS — I10 Essential (primary) hypertension: Secondary | ICD-10-CM | POA: Diagnosis present

## 2022-11-05 DIAGNOSIS — I639 Cerebral infarction, unspecified: Secondary | ICD-10-CM

## 2022-11-05 DIAGNOSIS — Q2112 Patent foramen ovale: Secondary | ICD-10-CM | POA: Diagnosis not present

## 2022-11-05 DIAGNOSIS — R29701 NIHSS score 1: Secondary | ICD-10-CM | POA: Diagnosis present

## 2022-11-05 DIAGNOSIS — Z880 Allergy status to penicillin: Secondary | ICD-10-CM | POA: Diagnosis not present

## 2022-11-05 DIAGNOSIS — Z79899 Other long term (current) drug therapy: Secondary | ICD-10-CM | POA: Diagnosis not present

## 2022-11-05 DIAGNOSIS — E785 Hyperlipidemia, unspecified: Secondary | ICD-10-CM | POA: Diagnosis present

## 2022-11-05 LAB — LIPID PANEL
Cholesterol: 197 mg/dL (ref 0–200)
HDL: 31 mg/dL — ABNORMAL LOW (ref >40)
LDL Cholesterol: 139 mg/dL — ABNORMAL HIGH (ref 0–99)
Total CHOL/HDL Ratio: 6.4 RATIO
Triglycerides: 133 mg/dL (ref <150)
VLDL: 27 mg/dL (ref 0–40)

## 2022-11-05 LAB — ECHOCARDIOGRAM COMPLETE BUBBLE STUDY
AR max vel: 2.58 cm2
AV Peak grad: 7.8 mmHg
Ao pk vel: 1.4 m/s
Area-P 1/2: 3.17 cm2
MV M vel: 1.3 m/s
MV Peak grad: 6.8 mmHg
S' Lateral: 3 cm

## 2022-11-05 LAB — C-REACTIVE PROTEIN: CRP: 0.9 mg/dL (ref <1.0)

## 2022-11-05 LAB — HEMOGLOBIN A1C
Hgb A1c MFr Bld: 5.5 % (ref 4.8–5.6)
Mean Plasma Glucose: 111.15 mg/dL

## 2022-11-05 LAB — SEDIMENTATION RATE: Sed Rate: 17 mm/hr (ref 0–22)

## 2022-11-05 LAB — HIV ANTIBODY (ROUTINE TESTING W REFLEX): HIV Screen 4th Generation wRfx: NONREACTIVE

## 2022-11-05 LAB — ANTITHROMBIN III: AntiThromb III Func: 95 % (ref 75–120)

## 2022-11-05 MED ORDER — ASPIRIN 300 MG RE SUPP: 300.0000 mg | Freq: Every day | RECTAL | Status: DC

## 2022-11-05 MED ORDER — CLOPIDOGREL BISULFATE 75 MG PO TABS
75.0000 mg | ORAL_TABLET | Freq: Every day | ORAL | Status: DC
  Administered 2022-11-05 – 2022-11-07 (×3): 75 mg via ORAL
  Filled 2022-11-05 (×3): qty 1

## 2022-11-05 MED ORDER — ASPIRIN 81 MG PO CHEW
81.0000 mg | CHEWABLE_TABLET | Freq: Every day | ORAL | Status: DC
  Administered 2022-11-05 – 2022-11-07 (×3): 81 mg via ORAL
  Filled 2022-11-05 (×3): qty 1

## 2022-11-05 MED ORDER — ATORVASTATIN CALCIUM 40 MG PO TABS
40.0000 mg | ORAL_TABLET | Freq: Every day | ORAL | Status: DC
  Administered 2022-11-05 – 2022-11-07 (×3): 40 mg via ORAL
  Filled 2022-11-05 (×3): qty 1

## 2022-11-05 NOTE — Progress Notes (Addendum)
STROKE TEAM PROGRESS NOTE   BRIEF HPI [name] is a 37 y.o. female with history of anemia, depression, frequent headaches, otitis media and rheumatoid arthritis who presents to the [facility] ED after acute onset of double vision.  MRI shows a stroke in the medial aspect of the right thalamus.  Rheumatoid factor elevated in 12/2018. She is currently on humera and plaquenil for her rheumatoid arthritis. She has also seen hematology for low iron levels. 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    SIGNIFICANT HOSPITAL EVENTS 7/23- Admit to [facility]  7/24 - Echo with positive bubble study  INTERIM HISTORY/SUBJECTIVE Seen in room today, vision has returned to normal, no nystagmus or diplopia.  TEE scheduled for Friday.    Will need outpatient follow up with hematology and neurology.    OBJECTIVE  CBC    Component Value Date/Time   WBC 11.3 (H) 11/04/2022 1435   RBC 5.27 (H) 11/04/2022 1435   HGB 13.3 11/04/2022 1520   HGB 15.3 08/06/2021 1411   HCT 39.0 11/04/2022 1520   HCT 45.9 08/06/2021 1411   PLT 320 11/04/2022 1435   PLT 291 08/06/2021 1411   MCV 84.1 11/04/2022 1435   MCV 86 08/06/2021 1411   MCH 26.2 11/04/2022 1435   MCHC 31.2 11/04/2022 1435   RDW 12.9 11/04/2022 1435   RDW 11.6 (L) 08/06/2021 1411   LYMPHSABS 2.5 11/04/2022 1435   LYMPHSABS 2.1 08/06/2021 1411   MONOABS 0.7 11/04/2022 1435   EOSABS 0.1 11/04/2022 1435   EOSABS 0.1 08/06/2021 1411   BASOSABS 0.1 11/04/2022 1435   BASOSABS 0.1 08/06/2021 1411    BMET    Component Value Date/Time   NA 138 11/04/2022 1520   NA 144 08/06/2021 1411   K 3.4 (L) 11/04/2022 1520   CL 105 11/04/2022 1520   CO2 23 11/04/2022 1435   GLUCOSE 88 11/04/2022 1520   BUN 11 11/04/2022 1520   BUN 8 08/06/2021 1411   CREATININE 0.80 11/04/2022 1520   CREATININE 0.85 04/15/2022 1330   CREATININE 0.80 04/29/2012 1724   CALCIUM 8.6 (L) 11/04/2022  1435   EGFR 88 08/06/2021 1411   GFRNONAA >60 11/04/2022 1435   GFRNONAA >60 04/15/2022 1330    IMAGING past 24 hours MR BRAIN W WO CONTRAST  Result Date: 11/04/2022 CLINICAL DATA:  Neuro deficit, acute, stroke suspected; Double vision. EXAM: MRI HEAD AND ORBITS WITHOUT AND WITH CONTRAST TECHNIQUE: Multiplanar, multiecho pulse sequences of the brain and surrounding structures were obtained without and with intravenous contrast. Multiplanar, multiecho pulse sequences of the orbits and surrounding structures were obtained including fat saturation techniques, before and after intravenous contrast administration. CONTRAST:  10mL GADAVIST GADOBUTROL 1 MMOL/ML IV SOLN COMPARISON:  Head CT and CTA head/neck 11/04/2022. FINDINGS: MRI HEAD FINDINGS Brain: Acute infarct in the medial aspect of the right thalamus (axial image 25 series 8, coronal image 19 series 6). Possible punctate acute infarct in the medial aspect of the left thalamus (axial image 25 series 8). No acute hemorrhage or significant mass effect. No hydrocephalus or extra-axial collection. No mass or abnormal enhancement. No foci of abnormal susceptibility. Vascular: Normal flow voids and vessel enhancement. Skull and upper cervical spine: Normal marrow signal and enhancement. Other: None. MRI ORBITS FINDINGS Orbits: No traumatic or inflammatory finding. Globes, optic nerves, orbital fat, extraocular muscles, vascular structures, and lacrimal glands are normal. Visualized sinuses: Clear. Soft tissues:  Unremarkable. IMPRESSION: 1. Acute infarct in the medial aspect of the right thalamus. Possible punctate acute infarct in the medial aspect of the left thalamus. No acute hemorrhage or significant mass effect. 2. Normal MRI of the orbits. Electronically Signed   By: Orvan Falconer M.D.   On: 11/04/2022 18:45   MR ORBITS W WO CONTRAST  Result Date: 11/04/2022 CLINICAL DATA:  Neuro deficit, acute, stroke suspected; Double vision. EXAM: MRI HEAD AND  ORBITS WITHOUT AND WITH CONTRAST TECHNIQUE: Multiplanar, multiecho pulse sequences of the brain and surrounding structures were obtained without and with intravenous contrast. Multiplanar, multiecho pulse sequences of the orbits and surrounding structures were obtained including fat saturation techniques, before and after intravenous contrast administration. CONTRAST:  10mL GADAVIST GADOBUTROL 1 MMOL/ML IV SOLN COMPARISON:  Head CT and CTA head/neck 11/04/2022. FINDINGS: MRI HEAD FINDINGS Brain: Acute infarct in the medial aspect of the right thalamus (axial image 25 series 8, coronal image 19 series 6). Possible punctate acute infarct in the medial aspect of the left thalamus (axial image 25 series 8). No acute hemorrhage or significant mass effect. No hydrocephalus or extra-axial collection. No mass or abnormal enhancement. No foci of abnormal susceptibility. Vascular: Normal flow voids and vessel enhancement. Skull and upper cervical spine: Normal marrow signal and enhancement. Other: None. MRI ORBITS FINDINGS Orbits: No traumatic or inflammatory finding. Globes, optic nerves, orbital fat, extraocular muscles, vascular structures, and lacrimal glands are normal. Visualized sinuses: Clear. Soft tissues: Unremarkable. IMPRESSION: 1. Acute infarct in the medial aspect of the right thalamus. Possible punctate acute infarct in the medial aspect of the left thalamus. No acute hemorrhage or significant mass effect. 2. Normal MRI of the orbits. Electronically Signed   By: Orvan Falconer M.D.   On: 11/04/2022 18:45   CT HEAD CODE STROKE WO CONTRAST  Result Date: 11/04/2022 CLINICAL DATA:  Code stroke. Sudden onset vision changes. Double vision in right eye. EXAM: CT ANGIOGRAPHY HEAD AND NECK CT PERFUSION BRAIN TECHNIQUE: Multidetector CT imaging of the head and neck was performed using the standard protocol during bolus administration of intravenous contrast. Multiplanar CT image reconstructions and MIPs were  obtained to evaluate the vascular anatomy. Carotid stenosis measurements (when applicable) are obtained utilizing NASCET criteria, using the distal internal carotid diameter as the denominator. Multiphase CT imaging of the brain was performed following IV bolus contrast injection. Subsequent parametric perfusion maps were calculated using RAPID software. RADIATION DOSE REDUCTION: This exam was performed according to the departmental dose-optimization program which includes automated exposure control, adjustment of the mA and/or kV according to patient size and/or use of iterative reconstruction technique. CONTRAST:  OMNIPAQUE IOHEXOL 350 MG/ML SOLN COMPARISON:  None Available. FINDINGS: CT HEAD FINDINGS Brain: There is no acute intracranial hemorrhage, extra-axial fluid collection, or acute infarct. Parenchymal volume is normal. The ventricles are normal in size. Gray-white differentiation is preserved The pituitary and suprasellar region are normal. There is no mass lesion. There is no mass effect or midline shift. Vascular: No hyperdense vessel or unexpected calcification. Skull: Normal. Negative for fracture or focal lesion. Sinuses/Orbits: The paranasal sinuses are clear. The globes and orbits are unremarkable. Other: The mastoid air cells and middle ear cavities are clear. ASPECTS [facility] Stroke Program Early CT Score) - Ganglionic level infarction (caudate, lentiform nuclei, internal capsule, insula, M1-M3 cortex): 7 - Supraganglionic infarction (M4-M6 cortex): 3 Total score (0-10 with 10 being normal): 10 CTA NECK FINDINGS Aortic arch: The imaged aortic arch is normal. The origins of the major branch  vessels are patent. The subclavian arteries are patent to the level imaged. Right carotid system: The right common, internal, and external carotid arteries are patent, without hemodynamically significant stenosis or occlusion there is no evidence of dissection or aneurysm. Left carotid system: The left  common, internal, and external carotid arteries are patent, without hemodynamically significant stenosis or occlusion. There is no evidence of dissection or aneurysm. Vertebral arteries: The vertebral arteries are patent, without hemodynamically significant stenosis or occlusion there is no evidence of dissection or aneurysm. Skeleton: There is no acute osseous abnormality or suspicious osseous lesion. There is no visible canal hematoma. Other neck: The soft tissues of the neck are unremarkable. Upper chest: The imaged lung apices are clear. Review of the MIP images confirms the above findings CTA HEAD FINDINGS Anterior circulation: The intracranial ICAs are normal. The ophthalmic arteries are identified bilaterally. The bilateral MCAs and ACAS are normal. The anterior communicating artery is normal. There is no aneurysm or AVM. Posterior circulation: The bilateral V4 segments are normal. The basilar artery is normal. The major cerebellar arteries appear patent. The bilateral PCAs are normal. Bilateral posterior communicating arteries are identified with a predominantly fetal origin on the right. There is no aneurysm or AVM. Venous sinuses: Patent. Anatomic variants: As above. Review of the MIP images confirms the above findings CT Brain Perfusion Findings: ASPECTS: 10 CBF (<30%) Volume: 0mL Perfusion (Tmax>6.0s) volume: 0mL Mismatch Volume: 0mL Infarction Location:N/a IMPRESSION: 1. Normal noncontrast head CT. 2. Normal CTA of the neck. 3. Negative CT perfusion. The results of the initial noncontrast head CT were called by telephone at the time of interpretation on 11/04/2022 at 2:51 pm to provider Dr Earlene Plater , who verbally acknowledged these results. Electronically Signed   By: Lesia Hausen M.D.   On: 11/04/2022 15:08   CT ANGIO HEAD NECK W WO CM W PERF (CODE STROKE)  Result Date: 11/04/2022 CLINICAL DATA:  Code stroke. Sudden onset vision changes. Double vision in right eye. EXAM: CT ANGIOGRAPHY HEAD AND NECK CT  PERFUSION BRAIN TECHNIQUE: Multidetector CT imaging of the head and neck was performed using the standard protocol during bolus administration of intravenous contrast. Multiplanar CT image reconstructions and MIPs were obtained to evaluate the vascular anatomy. Carotid stenosis measurements (when applicable) are obtained utilizing NASCET criteria, using the distal internal carotid diameter as the denominator. Multiphase CT imaging of the brain was performed following IV bolus contrast injection. Subsequent parametric perfusion maps were calculated using RAPID software. RADIATION DOSE REDUCTION: This exam was performed according to the departmental dose-optimization program which includes automated exposure control, adjustment of the mA and/or kV according to patient size and/or use of iterative reconstruction technique. CONTRAST:  OMNIPAQUE IOHEXOL 350 MG/ML SOLN COMPARISON:  None Available. FINDINGS: CT HEAD FINDINGS Brain: There is no acute intracranial hemorrhage, extra-axial fluid collection, or acute infarct. Parenchymal volume is normal. The ventricles are normal in size. Gray-white differentiation is preserved The pituitary and suprasellar region are normal. There is no mass lesion. There is no mass effect or midline shift. Vascular: No hyperdense vessel or unexpected calcification. Skull: Normal. Negative for fracture or focal lesion. Sinuses/Orbits: The paranasal sinuses are clear. The globes and orbits are unremarkable. Other: The mastoid air cells and middle ear cavities are clear. ASPECTS [facility] Stroke Program Early CT Score) - Ganglionic level infarction (caudate, lentiform nuclei, internal capsule, insula, M1-M3 cortex): 7 - Supraganglionic infarction (M4-M6 cortex): 3 Total score (0-10 with 10 being normal): 10 CTA NECK FINDINGS Aortic arch: The imaged  aortic arch is normal. The origins of the major branch vessels are patent. The subclavian arteries are patent to the level imaged. Right  carotid system: The right common, internal, and external carotid arteries are patent, without hemodynamically significant stenosis or occlusion there is no evidence of dissection or aneurysm. Left carotid system: The left common, internal, and external carotid arteries are patent, without hemodynamically significant stenosis or occlusion. There is no evidence of dissection or aneurysm. Vertebral arteries: The vertebral arteries are patent, without hemodynamically significant stenosis or occlusion there is no evidence of dissection or aneurysm. Skeleton: There is no acute osseous abnormality or suspicious osseous lesion. There is no visible canal hematoma. Other neck: The soft tissues of the neck are unremarkable. Upper chest: The imaged lung apices are clear. Review of the MIP images confirms the above findings CTA HEAD FINDINGS Anterior circulation: The intracranial ICAs are normal. The ophthalmic arteries are identified bilaterally. The bilateral MCAs and ACAS are normal. The anterior communicating artery is normal. There is no aneurysm or AVM. Posterior circulation: The bilateral V4 segments are normal. The basilar artery is normal. The major cerebellar arteries appear patent. The bilateral PCAs are normal. Bilateral posterior communicating arteries are identified with a predominantly fetal origin on the right. There is no aneurysm or AVM. Venous sinuses: Patent. Anatomic variants: As above. Review of the MIP images confirms the above findings CT Brain Perfusion Findings: ASPECTS: 10 CBF (<30%) Volume: 0mL Perfusion (Tmax>6.0s) volume: 0mL Mismatch Volume: 0mL Infarction Location:N/a IMPRESSION: 1. Normal noncontrast head CT. 2. Normal CTA of the neck. 3. Negative CT perfusion. The results of the initial noncontrast head CT were called by telephone at the time of interpretation on 11/04/2022 at 2:51 pm to provider Dr Earlene Plater , who verbally acknowledged these results. Electronically Signed   By: Lesia Hausen M.D.    On: 11/04/2022 15:08    Vitals:   11/04/22 2045 11/04/22 2119 11/04/22 2329 11/05/22 0307  BP: (!) 159/71 137/67 122/80 127/76  Pulse: 82 81 75 84  Resp:  18    Temp:  98.4 F (36.9 C) 98.6 F (37 C) 99.1 F (37.3 C)  TempSrc:  Oral Oral Oral  SpO2: 96% 97% 97% 98%  Weight:      Height:         PHYSICAL EXAM General:  Alert, well-nourished, well-developed patient in no acute distress Psych:  Mood and affect appropriate for situation CV: Regular rate and rhythm on monitor Respiratory:  Regular, unlabored respirations on room air GI: Abdomen soft and nontender   NEURO:  Mental Status: AA&Ox3, patient is able to give clear and coherent history Speech/Language: speech is without dysarthria or aphasia.  Naming, repetition, fluency, and comprehension intact.  Cranial Nerves:  II: PERRL. Visual fields full.  III, IV, VI: EOMI. Eyelids elevate symmetrically.  V: Sensation is intact to light touch and symmetrical to face.  VII: Face is symmetrical resting and smiling VIII: hearing intact to voice. IX, X: Palate elevates symmetrically. Phonation is normal.  ZO:[id number] shrug 5/5. XII: tongue is midline without fasciculations. Motor: 5/5 strength to all muscle groups tested.  Tone: is normal and bulk is normal Sensation- Intact to light touch bilaterally. Extinction absent to light touch to DSS.   Coordination: FTN intact bilaterally, HKS: no ataxia in BLE.No drift.  Gait- deferred   ASSESSMENT/PLAN  Acute Ischemic Infarct:  right thalamic infarct, possible punctate infarct in medial aspect of left thalamus  Etiology:  cryptogenic  Code Stroke CT head No acute  abnormality.  CTA head & neck Normal CTA head and neck MRI  Acute infarct in the medial aspect of the right thalamus. Possible punctate acute infarct in the medial aspect of the left thalamus. No acute hemorrhage or significant mass effect.  Lower extremity venous duplex - negative for DVT 2D Echo with bubble study -  EF 60-65%, bubble study positive  LDL 139 HgbA1c 5.5 Hypercoagulable work up pending Sed rate Prothrombin gene mutation Factor 5 Homocysteine CRP - 0.9 Cardiolipin antibodies Lupus Protein S Protein C Antithrombin III- 95 VTE prophylaxis - lovenox No antithrombotic prior to admission, now on aspirin 81 mg daily and clopidogrel 75 mg daily for 3 weeks and then ASA 81mg  alone. Therapy recommendations:  No follow up needed Disposition:  Home Blood Pressure Goal: BP less than 220/110   Hyperlipidemia LDL 139, goal < 70 Add Atorvastatin 40mg   Continue statin at discharge  Other Stroke Risk Factors Obesity, Body mass index is 40.35 kg/m., BMI >/= 30 associated with increased stroke risk, recommend weight loss, diet and exercise as appropriate  Rheumatoid Arthritis Humera and plaquenil  Other Active Problems Depression Lexapro  Hospital day # 0  Patient seen and examined by NP/APP with MD. MD to update note as needed.   Elmer Picker, DNP, FNP-BC [facility] Pager: [phone]  ATTENDING ATTESTATION:  66 h/o with h/o RA on plaquenil with  right thalamic CVA, possible left thalamic but maybe artifact. Initially with later eye signs but now completely resolved with normal exam.  Will conduct young stroke workup. Echo shows PFO, TEE pending, loop monitor, LE dopplers and hypercoag workup.  DAPT for 3 weeks then aspirin alone. Not on OCPs.   Dr. Viviann Spare evaluated pt independently, reviewed imaging, chart, labs. Discussed and formulated plan with the Resident/APP. Changes were made to the note where appropriate. Please see APP/resident note above for details.      Keyanni Whittinghill,MD   To contact Stroke Continuity provider, please refer to [email or web address]. After hours, contact General Neurology

## 2022-11-05 NOTE — Progress Notes (Signed)
PROGRESS NOTE  Laura [name]  WGN:[id number] DOB: 16-Feb-1986 DOA: 11/04/2022 PCP: Laura Eth, NP   Brief Narrative: Patient is a 37 year old female with history of arthritis, anxiety who presented with visual disturbance complaining of double vision which resolves when closing 1 eye.  Also reported difficulty with balance.  On presentation, she was hemodynamically stable.  Head CT, CTA head and neck did not show any acute findings.  MRI of the brain showed acute infarct in the medial aspect of the right thalamus,possible punctate acute infarct in the medial aspect of the left thalamus.  Admitted for stroke workup.  Neurology consulted.  Plan for TEE  Assessment & Plan:  Principal Problem:   Acute ischemic stroke [name]) Active Problems:   Rheumatoid arthritis (HCC)   GAD (generalized anxiety disorder)   Vision disturbance   Acute ischemic stroke/visual disturbance: Presented with double vision, difficulty with balance. Head CT, CTA head and neck did not show any acute findings.  MRI of the brain showed acute infarct in the medial aspect of the right thalamus,possible punctate acute infarct in the medial aspect of the left thalamus.  Admitted for stroke workup.  Neurology consulted and following LDL of 139, A1c of 5.5. Echo showed EF of 60 to 60%, no wall motion abnormality, agitated saline contrast bubble study was positive suggesting intra-atrial shunt.  Plan for TEE. PT/OT signed off. Started on aspirin, Plavix.  Needs to be started on a statin as well.  Rheumatoid arthritis: On Plaquenil  Anxiety: On Lexapro  Morbid obesity: BMI 40.3.  She might have sleep study to rule out sleep apnea as an outpatient          DVT prophylaxis:SCD's Start: 11/04/22 1715     Code Status: Full Code  Family Communication: Father at bedside  Patient status:Inpatient  Patient is from :home  Anticipated discharge HQ:[facility]  Estimated DC date:After  Full stroke  workup   Consultants: Neurology  Procedures: None  Antimicrobials:  Anti-infectives (From admission, onward)    Start     Dose/Rate Route Frequency Ordered Stop   11/04/22 2200  hydroxychloroquine (PLAQUENIL) tablet 200 mg        200 mg Oral 2 times daily 11/04/22 1844         Subjective: Patient seen and examined at bedside today.  Hemodynamically stable.  Lying comfortably in bed.  No double vision, focal weakness or numbness  Objective: Vitals:   11/04/22 2119 11/04/22 2329 11/05/22 0307 11/05/22 0850  BP: 137/67 122/80 127/76 121/82  Pulse: 81 75 84 72  Resp: 18   18  Temp: 98.4 F (36.9 C) 98.6 F (37 C) 99.1 F (37.3 C) 98.2 F (36.8 C)  TempSrc: Oral Oral Oral Oral  SpO2: 97% 97% 98% 97%  Weight:      Height:        Intake/Output Summary (Last 24 hours) at 11/05/2022 1133 Last data filed at 11/05/2022 0800 Gross per 24 hour  Intake 915.48 ml  Output --  Net 915.48 ml   Filed Weights   11/04/22 1425  Weight: 113.4 kg    Examination:  General exam: Overall comfortable, not in distress, morbid obesity HEENT: PERRL Respiratory system:  no wheezes or crackles  Cardiovascular system: S1 & S2 heard, RRR.  Gastrointestinal system: Abdomen is nondistended, soft and nontender. Central nervous system: Alert and oriented Extremities: No edema, no clubbing ,no cyanosis Skin: No rashes, no ulcers,no icterus     Data Reviewed: I have personally reviewed following labs  and imaging studies  CBC: Recent Labs  Lab 11/04/22 1435 11/04/22 1520  WBC 11.3*  --   NEUTROABS 7.9*  --   HGB 13.8 13.3  HCT 44.3 39.0  MCV 84.1  --   PLT 320  --    Basic Metabolic Panel: Recent Labs  Lab 11/04/22 1435 11/04/22 1520  NA 135 138  K 3.7 3.4*  CL 105 105  CO2 23  --   GLUCOSE 103* 88  BUN 12 11  CREATININE 0.88 0.80  CALCIUM 8.6*  --      No results found for this or any previous visit (from the past 240 hour(s)).   Radiology Studies: ECHOCARDIOGRAM  COMPLETE BUBBLE STUDY  Result Date: 11/05/2022    ECHOCARDIOGRAM REPORT   Patient Name:   Laura [name] Date of Exam: 11/05/2022 Medical Rec #:  [id number]            Height:       66.0 in Accession #:    [id number]           Weight:       250.0 lb Date of Birth:  August 10, 1985            BSA:          2.199 m Patient Age:    37 years             BP:           127/76 mmHg Patient Gender: F                    HR:           67 bpm. Exam Location:  Inpatient Procedure: 2D Echo, Cardiac Doppler, Color Doppler and Saline Contrast Bubble            Study Indications:   Stroke 434.91 / I63.9  History:       Patient has no prior history of Echocardiogram examinations.                Stroke.  Sonographer:   Laura [name] Referring      [id number] Laura [name] Phys: IMPRESSIONS  1. Left ventricular ejection fraction, by estimation, is 60 to 65%. The left ventricle has normal function. The left ventricle has no regional wall motion abnormalities. Left ventricular diastolic parameters were normal.  2. Right ventricular systolic function is normal. The right ventricular size is normal. There is normal pulmonary artery systolic pressure.  3. The mitral valve is normal in structure. Trivial mitral valve regurgitation. No evidence of mitral stenosis.  4. The aortic valve is normal in structure. Aortic valve regurgitation is not visualized. No aortic stenosis is present.  5. The inferior vena cava is normal in size with greater than 50% respiratory variability, suggesting right atrial pressure of 3 mmHg.  6. Agitated saline contrast bubble study was positive with shunting observed within 3-6 cardiac cycles suggestive of interatrial shunt. Comparison(s): No prior Echocardiogram. Conclusion(s)/Recommendation(s): Positive bubble study (seen within 3 beats). Consider TEE for further evaluation if clinically indicated. FINDINGS  Left Ventricle: Left ventricular ejection fraction, by estimation, is 60 to 65%. The left ventricle  has normal function. The left ventricle has no regional wall motion abnormalities. The left ventricular internal cavity size was normal in size. There is  no left ventricular hypertrophy. Left ventricular diastolic parameters were normal. Right Ventricle: The right ventricular size is normal. No increase in right ventricular wall thickness. Right ventricular systolic function is  normal. There is normal pulmonary artery systolic pressure. The tricuspid regurgitant velocity is 1.25 m/s, and  with an assumed right atrial pressure of 3 mmHg, the estimated right ventricular systolic pressure is 9.2 mmHg. Left Atrium: Left atrial size was normal in size. Right Atrium: Right atrial size was normal in size. Pericardium: There is no evidence of pericardial effusion. Mitral Valve: The mitral valve is normal in structure. Trivial mitral valve regurgitation. No evidence of mitral valve stenosis. Tricuspid Valve: The tricuspid valve is normal in structure. Tricuspid valve regurgitation is trivial. No evidence of tricuspid stenosis. Aortic Valve: The aortic valve is normal in structure. Aortic valve regurgitation is not visualized. No aortic stenosis is present. Aortic valve peak gradient measures 7.8 mmHg. Pulmonic Valve: The pulmonic valve was not well visualized. Pulmonic valve regurgitation is trivial. No evidence of pulmonic stenosis. Aorta: The aortic root, ascending aorta, aortic arch and descending aorta are all structurally normal, with no evidence of dilitation or obstruction. Venous: The inferior vena cava is normal in size with greater than 50% respiratory variability, suggesting right atrial pressure of 3 mmHg. IAS/Shunts: No atrial level shunt detected by color flow Doppler. Agitated saline contrast was given intravenously to evaluate for intracardiac shunting. Agitated saline contrast bubble study was positive with shunting observed within 3-6 cardiac cycles suggestive of interatrial shunt.  LEFT VENTRICLE PLAX 2D  LVIDd:         4.60 cm   Diastology LVIDs:         3.00 cm   LV e' medial:    11.00 cm/s LV PW:         0.80 cm   LV E/e' medial:  8.0 LV IVS:        0.80 cm   LV e' lateral:   12.70 cm/s LVOT diam:     2.20 cm   LV E/e' lateral: 6.9 LV SV:         77 LV SV Index:   35 LVOT Area:     3.80 cm                           3D Volume EF:                          3D EF:        63 %                          LV EDV:       149 ml                          LV ESV:       55 ml                          LV SV:        94 ml RIGHT VENTRICLE             IVC RV S prime:     13.30 cm/s  IVC diam: 1.90 cm TAPSE (M-mode): 2.4 cm LEFT ATRIUM           Index        RIGHT ATRIUM           Index LA diam:      3.50 cm 1.59 cm/m   RA Area:  12.00 cm LA Vol (A2C): 43.7 ml 19.88 ml/m  RA Volume:   21.50 ml  9.78 ml/m LA Vol (A4C): 47.9 ml 21.79 ml/m  AORTIC VALVE AV Area (Vmax): 2.58 cm AV Vmax:        140.00 cm/s AV Peak Grad:   7.8 mmHg LVOT Vmax:      95.20 cm/s LVOT Vmean:     61.800 cm/s LVOT VTI:       0.203 m  AORTA Ao Root diam: 3.00 cm Ao Asc diam:  3.00 cm MITRAL VALVE                TRICUSPID VALVE MV Area (PHT): 3.17 cm     TR Peak grad:   6.2 mmHg MV Decel Time: 239 msec     TR Vmax:        125.00 cm/s MR Peak grad: 6.8 mmHg MR Vmax:      130.00 cm/s   SHUNTS MV E velocity: 87.50 cm/s   Systemic VTI:  0.20 m MV A velocity: 104.00 cm/s  Systemic Diam: 2.20 cm MV E/A ratio:  0.84 Jodelle Red MD Electronically signed by Jodelle Red MD Signature Date/Time: 11/05/2022/11:09:04 AM    Final    VAS [place] LOWER EXTREMITY VENOUS (DVT)  Result Date: 11/05/2022  Lower Venous DVT Study Patient Name:  Laura [name]  Date of Exam:   11/05/2022 Medical Rec #: [id number]             Accession #:    [id number] Date of Birth: 1985/08/09             Patient Gender: F Patient Age:   53 years Exam Location:  [facility] Procedure:      VAS [place] LOWER EXTREMITY VENOUS (DVT) Referring Phys: Leticia Penna  --------------------------------------------------------------------------------  Indications: Stroke.  Risk Factors: None identified. Comparison Study: No prior study Performing Technologist: Shona Simpson  Examination Guidelines: A complete evaluation includes B-mode imaging, spectral Doppler, color Doppler, and power Doppler as needed of all accessible portions of each vessel. Bilateral testing is considered an integral part of a complete examination. Limited examinations for reoccurring indications may be performed as noted. The reflux portion of the exam is performed with the patient in reverse Trendelenburg.  +---------+---------------+---------+-----------+----------+--------------+ RIGHT    CompressibilityPhasicitySpontaneityPropertiesThrombus Aging +---------+---------------+---------+-----------+----------+--------------+ CFV      Full           Yes      Yes                                 +---------+---------------+---------+-----------+----------+--------------+ SFJ      Full                                                        +---------+---------------+---------+-----------+----------+--------------+ FV Prox  Full                                                        +---------+---------------+---------+-----------+----------+--------------+ FV Mid   Full                                                        +---------+---------------+---------+-----------+----------+--------------+  FV DistalFull                                                        +---------+---------------+---------+-----------+----------+--------------+ PFV      Full                                                        +---------+---------------+---------+-----------+----------+--------------+ POP      Full           Yes      Yes                                 +---------+---------------+---------+-----------+----------+--------------+ PTV      Full                                                         +---------+---------------+---------+-----------+----------+--------------+ PERO     Full                                                        +---------+---------------+---------+-----------+----------+--------------+   +---------+---------------+---------+-----------+----------+--------------+ LEFT     CompressibilityPhasicitySpontaneityPropertiesThrombus Aging +---------+---------------+---------+-----------+----------+--------------+ CFV      Full           Yes      Yes                                 +---------+---------------+---------+-----------+----------+--------------+ SFJ      Full                                                        +---------+---------------+---------+-----------+----------+--------------+ FV Prox  Full                                                        +---------+---------------+---------+-----------+----------+--------------+ FV Mid   Full                                                        +---------+---------------+---------+-----------+----------+--------------+ FV DistalFull                                                        +---------+---------------+---------+-----------+----------+--------------+  PFV      Full                                                        +---------+---------------+---------+-----------+----------+--------------+ POP      Full           Yes      Yes                                 +---------+---------------+---------+-----------+----------+--------------+ PTV      Full                                                        +---------+---------------+---------+-----------+----------+--------------+ PERO     Full                                                        +---------+---------------+---------+-----------+----------+--------------+     Summary: BILATERAL: - No evidence of deep vein thrombosis seen in the lower  extremities, bilaterally. -No evidence of popliteal cyst, bilaterally.   *See table(s) above for measurements and observations.    Preliminary    MR BRAIN W WO CONTRAST  Result Date: 11/04/2022 CLINICAL DATA:  Neuro deficit, acute, stroke suspected; Double vision. EXAM: MRI HEAD AND ORBITS WITHOUT AND WITH CONTRAST TECHNIQUE: Multiplanar, multiecho pulse sequences of the brain and surrounding structures were obtained without and with intravenous contrast. Multiplanar, multiecho pulse sequences of the orbits and surrounding structures were obtained including fat saturation techniques, before and after intravenous contrast administration. CONTRAST:  10mL GADAVIST GADOBUTROL 1 MMOL/ML IV SOLN COMPARISON:  Head CT and CTA head/neck 11/04/2022. FINDINGS: MRI HEAD FINDINGS Brain: Acute infarct in the medial aspect of the right thalamus (axial image 25 series 8, coronal image 19 series 6). Possible punctate acute infarct in the medial aspect of the left thalamus (axial image 25 series 8). No acute hemorrhage or significant mass effect. No hydrocephalus or extra-axial collection. No mass or abnormal enhancement. No foci of abnormal susceptibility. Vascular: Normal flow voids and vessel enhancement. Skull and upper cervical spine: Normal marrow signal and enhancement. Other: None. MRI ORBITS FINDINGS Orbits: No traumatic or inflammatory finding. Globes, optic nerves, orbital fat, extraocular muscles, vascular structures, and lacrimal glands are normal. Visualized sinuses: Clear. Soft tissues: Unremarkable. IMPRESSION: 1. Acute infarct in the medial aspect of the right thalamus. Possible punctate acute infarct in the medial aspect of the left thalamus. No acute hemorrhage or significant mass effect. 2. Normal MRI of the orbits. Electronically Signed   By: Orvan Falconer M.D.   On: 11/04/2022 18:45   MR ORBITS W WO CONTRAST  Result Date: 11/04/2022 CLINICAL DATA:  Neuro deficit, acute, stroke suspected; Double vision.  EXAM: MRI HEAD AND ORBITS WITHOUT AND WITH CONTRAST TECHNIQUE: Multiplanar, multiecho pulse sequences of the brain and surrounding structures were obtained without and with intravenous contrast. Multiplanar, multiecho pulse sequences of the orbits and surrounding structures were obtained including fat saturation techniques,  before and after intravenous contrast administration. CONTRAST:  10mL GADAVIST GADOBUTROL 1 MMOL/ML IV SOLN COMPARISON:  Head CT and CTA head/neck 11/04/2022. FINDINGS: MRI HEAD FINDINGS Brain: Acute infarct in the medial aspect of the right thalamus (axial image 25 series 8, coronal image 19 series 6). Possible punctate acute infarct in the medial aspect of the left thalamus (axial image 25 series 8). No acute hemorrhage or significant mass effect. No hydrocephalus or extra-axial collection. No mass or abnormal enhancement. No foci of abnormal susceptibility. Vascular: Normal flow voids and vessel enhancement. Skull and upper cervical spine: Normal marrow signal and enhancement. Other: None. MRI ORBITS FINDINGS Orbits: No traumatic or inflammatory finding. Globes, optic nerves, orbital fat, extraocular muscles, vascular structures, and lacrimal glands are normal. Visualized sinuses: Clear. Soft tissues: Unremarkable. IMPRESSION: 1. Acute infarct in the medial aspect of the right thalamus. Possible punctate acute infarct in the medial aspect of the left thalamus. No acute hemorrhage or significant mass effect. 2. Normal MRI of the orbits. Electronically Signed   By: Orvan Falconer M.D.   On: 11/04/2022 18:45   CT HEAD CODE STROKE WO CONTRAST  Result Date: 11/04/2022 CLINICAL DATA:  Code stroke. Sudden onset vision changes. Double vision in right eye. EXAM: CT ANGIOGRAPHY HEAD AND NECK CT PERFUSION BRAIN TECHNIQUE: Multidetector CT imaging of the head and neck was performed using the standard protocol during bolus administration of intravenous contrast. Multiplanar CT image reconstructions  and MIPs were obtained to evaluate the vascular anatomy. Carotid stenosis measurements (when applicable) are obtained utilizing NASCET criteria, using the distal internal carotid diameter as the denominator. Multiphase CT imaging of the brain was performed following IV bolus contrast injection. Subsequent parametric perfusion maps were calculated using RAPID software. RADIATION DOSE REDUCTION: This exam was performed according to the departmental dose-optimization program which includes automated exposure control, adjustment of the mA and/or kV according to patient size and/or use of iterative reconstruction technique. CONTRAST:  OMNIPAQUE IOHEXOL 350 MG/ML SOLN COMPARISON:  None Available. FINDINGS: CT HEAD FINDINGS Brain: There is no acute intracranial hemorrhage, extra-axial fluid collection, or acute infarct. Parenchymal volume is normal. The ventricles are normal in size. Gray-white differentiation is preserved The pituitary and suprasellar region are normal. There is no mass lesion. There is no mass effect or midline shift. Vascular: No hyperdense vessel or unexpected calcification. Skull: Normal. Negative for fracture or focal lesion. Sinuses/Orbits: The paranasal sinuses are clear. The globes and orbits are unremarkable. Other: The mastoid air cells and middle ear cavities are clear. ASPECTS [name] Stroke Program Early CT Score) - Ganglionic level infarction (caudate, lentiform nuclei, internal capsule, insula, M1-M3 cortex): 7 - Supraganglionic infarction (M4-M6 cortex): 3 Total score (0-10 with 10 being normal): 10 CTA NECK FINDINGS Aortic arch: The imaged aortic arch is normal. The origins of the major branch vessels are patent. The subclavian arteries are patent to the level imaged. Right carotid system: The right common, internal, and external carotid arteries are patent, without hemodynamically significant stenosis or occlusion there is no evidence of dissection or aneurysm. Left carotid  system: The left common, internal, and external carotid arteries are patent, without hemodynamically significant stenosis or occlusion. There is no evidence of dissection or aneurysm. Vertebral arteries: The vertebral arteries are patent, without hemodynamically significant stenosis or occlusion there is no evidence of dissection or aneurysm. Skeleton: There is no acute osseous abnormality or suspicious osseous lesion. There is no visible canal hematoma. Other neck: The soft tissues of the neck are unremarkable. Upper chest:  The imaged lung apices are clear. Review of the MIP images confirms the above findings CTA HEAD FINDINGS Anterior circulation: The intracranial ICAs are normal. The ophthalmic arteries are identified bilaterally. The bilateral MCAs and ACAS are normal. The anterior communicating artery is normal. There is no aneurysm or AVM. Posterior circulation: The bilateral V4 segments are normal. The basilar artery is normal. The major cerebellar arteries appear patent. The bilateral PCAs are normal. Bilateral posterior communicating arteries are identified with a predominantly fetal origin on the right. There is no aneurysm or AVM. Venous sinuses: Patent. Anatomic variants: As above. Review of the MIP images confirms the above findings CT Brain Perfusion Findings: ASPECTS: 10 CBF (<30%) Volume: 0mL Perfusion (Tmax>6.0s) volume: 0mL Mismatch Volume: 0mL Infarction Location:N/a IMPRESSION: 1. Normal noncontrast head CT. 2. Normal CTA of the neck. 3. Negative CT perfusion. The results of the initial noncontrast head CT were called by telephone at the time of interpretation on 11/04/2022 at 2:51 pm to provider Dr Earlene Plater , who verbally acknowledged these results. Electronically Signed   By: Lesia Hausen M.D.   On: 11/04/2022 15:08   CT ANGIO HEAD NECK W WO CM W PERF (CODE STROKE)  Result Date: 11/04/2022 CLINICAL DATA:  Code stroke. Sudden onset vision changes. Double vision in right eye. EXAM: CT ANGIOGRAPHY  HEAD AND NECK CT PERFUSION BRAIN TECHNIQUE: Multidetector CT imaging of the head and neck was performed using the standard protocol during bolus administration of intravenous contrast. Multiplanar CT image reconstructions and MIPs were obtained to evaluate the vascular anatomy. Carotid stenosis measurements (when applicable) are obtained utilizing NASCET criteria, using the distal internal carotid diameter as the denominator. Multiphase CT imaging of the brain was performed following IV bolus contrast injection. Subsequent parametric perfusion maps were calculated using RAPID software. RADIATION DOSE REDUCTION: This exam was performed according to the departmental dose-optimization program which includes automated exposure control, adjustment of the mA and/or kV according to patient size and/or use of iterative reconstruction technique. CONTRAST:  OMNIPAQUE IOHEXOL 350 MG/ML SOLN COMPARISON:  None Available. FINDINGS: CT HEAD FINDINGS Brain: There is no acute intracranial hemorrhage, extra-axial fluid collection, or acute infarct. Parenchymal volume is normal. The ventricles are normal in size. Gray-white differentiation is preserved The pituitary and suprasellar region are normal. There is no mass lesion. There is no mass effect or midline shift. Vascular: No hyperdense vessel or unexpected calcification. Skull: Normal. Negative for fracture or focal lesion. Sinuses/Orbits: The paranasal sinuses are clear. The globes and orbits are unremarkable. Other: The mastoid air cells and middle ear cavities are clear. ASPECTS [name] Stroke Program Early CT Score) - Ganglionic level infarction (caudate, lentiform nuclei, internal capsule, insula, M1-M3 cortex): 7 - Supraganglionic infarction (M4-M6 cortex): 3 Total score (0-10 with 10 being normal): 10 CTA NECK FINDINGS Aortic arch: The imaged aortic arch is normal. The origins of the major branch vessels are patent. The subclavian arteries are patent to the level  imaged. Right carotid system: The right common, internal, and external carotid arteries are patent, without hemodynamically significant stenosis or occlusion there is no evidence of dissection or aneurysm. Left carotid system: The left common, internal, and external carotid arteries are patent, without hemodynamically significant stenosis or occlusion. There is no evidence of dissection or aneurysm. Vertebral arteries: The vertebral arteries are patent, without hemodynamically significant stenosis or occlusion there is no evidence of dissection or aneurysm. Skeleton: There is no acute osseous abnormality or suspicious osseous lesion. There is no visible canal hematoma. Other neck:  The soft tissues of the neck are unremarkable. Upper chest: The imaged lung apices are clear. Review of the MIP images confirms the above findings CTA HEAD FINDINGS Anterior circulation: The intracranial ICAs are normal. The ophthalmic arteries are identified bilaterally. The bilateral MCAs and ACAS are normal. The anterior communicating artery is normal. There is no aneurysm or AVM. Posterior circulation: The bilateral V4 segments are normal. The basilar artery is normal. The major cerebellar arteries appear patent. The bilateral PCAs are normal. Bilateral posterior communicating arteries are identified with a predominantly fetal origin on the right. There is no aneurysm or AVM. Venous sinuses: Patent. Anatomic variants: As above. Review of the MIP images confirms the above findings CT Brain Perfusion Findings: ASPECTS: 10 CBF (<30%) Volume: 0mL Perfusion (Tmax>6.0s) volume: 0mL Mismatch Volume: 0mL Infarction Location:N/a IMPRESSION: 1. Normal noncontrast head CT. 2. Normal CTA of the neck. 3. Negative CT perfusion. The results of the initial noncontrast head CT were called by telephone at the time of interpretation on 11/04/2022 at 2:51 pm to provider Dr Earlene Plater , who verbally acknowledged these results. Electronically Signed   By: Lesia Hausen M.D.   On: 11/04/2022 15:08    Scheduled Meds:  aspirin  81 mg Oral Daily   Or   aspirin  300 mg Rectal Daily   clopidogrel  75 mg Oral Daily   escitalopram  20 mg Oral QHS   hydroxychloroquine  200 mg Oral BID   Continuous Infusions:   LOS: 0 days   Burnadette Pop, MD [facility] P7/24/2024, 11:33 AM

## 2022-11-05 NOTE — Plan of Care (Signed)
  Problem: Skin Integrity: Goal: Risk for impaired skin integrity will decrease 11/05/2022 0402 by Lanelle Bal, RN Outcome: Progressing 11/05/2022 0401 by Lanelle Bal, RN Outcome: Progressing   Problem: Safety: Goal: Ability to remain free from injury will improve 11/05/2022 0402 by Lanelle Bal, RN Outcome: Progressing 11/05/2022 0401 by Lanelle Bal, RN Outcome: Progressing   Problem: Pain Managment: Goal: General experience of comfort will improve 11/05/2022 0402 by Lanelle Bal, RN Outcome: Progressing 11/05/2022 0401 by Lanelle Bal, RN Outcome: Progressing   Problem: Elimination: Goal: Will not experience complications related to bowel motility 11/05/2022 0402 by Lanelle Bal, RN Outcome: Progressing 11/05/2022 0401 by Lanelle Bal, RN Outcome: Progressing Goal: Will not experience complications related to urinary retention 11/05/2022 0402 by Lanelle Bal, RN Outcome: Progressing 11/05/2022 0401 by Lanelle Bal, RN Outcome: Progressing

## 2022-11-05 NOTE — Evaluation (Addendum)
Occupational Therapy Evaluation/Discharge Patient Details Name: Laura [name] MRN: [id number] DOB: Sep 12, 1985 Today's Date: 11/05/2022   History of Present Illness Pt is a 37 y/o female presenting with visual changes. MRI brain showed acute infarct in R thalamus and possible punctate infarct in L thalamus. PMH: anemia, anxiety, depression, RA   Clinical Impression   PTA, pt lives with mother, typically completely Independent in all tasks and works at [employer]. Pt presents now with continued minor blurriness in vision though pt reports much improved from admission. Completed full vision assessment with no double vision, field cuts or other impairments noted. Pt able to manage ADLs and hallway mobility Independently without LOB or safety concerns. Educated re: BE FAST stroke signs and following up with eye MD if blurriness persists. No further skilled OT services needed at acute level or on DC.      Recommendations for follow up therapy are one component of a multi-disciplinary discharge planning process, led by the attending physician.  Recommendations may be updated based on patient status, additional functional criteria and insurance authorization.   Assistance Recommended at Discharge None  Patient can return home with the following      Functional Status Assessment  Patient has not had a recent decline in their functional status  Equipment Recommendations  None recommended by OT    Recommendations for Other Services       Precautions / Restrictions Precautions Precautions: None Restrictions Weight Bearing Restrictions: No      Mobility Bed Mobility Overal bed mobility: Independent                  Transfers Overall transfer level: Independent Equipment used: None                      Balance Overall balance assessment: Independent                                         ADL either performed or assessed with clinical judgement    ADL Overall ADL's : Independent                                       General ADL Comments: donning socks, ambulating to bathroom with IV pole mgmt and managing toileting task. Pt able to mobilize around hallway without LOB or concerns.     Vision Baseline Vision/History: 0 No visual deficits Ability to See in Adequate Light: 0 Adequate Patient Visual Report: Blurring of vision Vision Assessment?: Yes Eye Alignment: Within Functional Limits Ocular Range of Motion: Within Functional Limits Alignment/Gaze Preference: Within Defined Limits Tracking/Visual Pursuits: Able to track stimulus in all quads without difficulty Saccades: Within functional limits Convergence: Within functional limits Visual Fields: No apparent deficits Additional Comments: reports initial double vision (images on top of each other) that has resolved, able to read menu functionally to order breakfast. full vision assessment with peripheral field WFL. reports vision "not as blurry" as it was yesterday     Perception     Praxis      Pertinent Vitals/Pain Pain Assessment Pain Assessment: No/denies pain     Hand Dominance Right   Extremity/Trunk Assessment Upper Extremity Assessment Upper Extremity Assessment: Overall WFL for tasks assessed   Lower Extremity Assessment Lower Extremity Assessment: Overall WFL for tasks assessed  Cervical / Trunk Assessment Cervical / Trunk Assessment: Normal   Communication Communication Communication: No difficulties   Cognition Arousal/Alertness: Awake/alert Behavior During Therapy: WFL for tasks assessed/performed Overall Cognitive Status: Within Functional Limits for tasks assessed                                       General Comments       Exercises     Shoulder Instructions      Home Living Family/patient expects to be discharged to:: Private residence Living Arrangements: Parent Available Help at Discharge:  Family Type of Home: House Home Access: Stairs to enter Secretary/administrator of Steps: 3 Entrance Stairs-Rails: Left;Right Home Layout: One level;Other (Comment) (except one step down into den)               Home Equipment: None          Prior Functioning/Environment Prior Level of Function : Independent/Modified Independent;Driving;Working/employed               ADLs Comments: Works for [employer], runs labs for bone marrow        OT Problem List:        OT Treatment/Interventions:      OT Goals(Current goals can be found in the care plan section) Acute Rehab OT Goals Patient Stated Goal: see neurology OT Goal Formulation: All assessment and education complete, DC therapy  OT Frequency:      Co-evaluation              AM-PAC OT "6 Clicks" Daily Activity     Outcome Measure Help from another person eating meals?: None Help from another person taking care of personal grooming?: None Help from another person toileting, which includes using toliet, bedpan, or urinal?: None Help from another person bathing (including washing, rinsing, drying)?: None Help from another person to put on and taking off regular upper body clothing?: None Help from another person to put on and taking off regular lower body clothing?: None 6 Click Score: 24   End of Session Nurse Communication: Mobility status  Activity Tolerance: Patient tolerated treatment well Patient left: in chair;with call bell/phone within reach;Other (comment) (lab entering)  OT Visit Diagnosis: Low vision, both eyes (H54.2)                Time: 1610-9604 OT Time Calculation (min): 24 min Charges:  OT General Charges $OT Visit: 1 Visit OT Evaluation $OT Eval Low Complexity: 1 Low  Bradd Canary, OTR/L Acute Rehab Services Office: [phone]   Lorre Munroe 11/05/2022, 7:54 AM

## 2022-11-05 NOTE — TOC CM/SW Note (Signed)
Transition of Care [facility]) - Inpatient Brief Assessment   Patient Details  Name: KYMBERLEY RAZ MRN: [id number] Date of Birth: May 07, 1985  [facility]) CM/SW Contact:    Kermit Balo, RN Phone Number: 11/05/2022, 2:49 PM   Clinical Narrative: No f/u per therapies.  Plan is for a TEE.   Transition of Care Asessment: Insurance and Status: Insurance coverage has been reviewed Patient has primary care physician: Yes Home environment has been reviewed: home with mom   Prior/Current Home Services: No current home services Social Determinants of Health Reivew: SDOH reviewed no interventions necessary Readmission risk has been reviewed: Yes Transition of care needs: no transition of care needs at this time

## 2022-11-05 NOTE — Progress Notes (Signed)
Echocardiogram 2D Echocardiogram has been performed.  Laura [name] 11/05/2022, 9:03 AM

## 2022-11-05 NOTE — Progress Notes (Signed)
PT Cancellation Note and Discharge  Patient Details Name: Laura [name] MRN: [id number] DOB: 11-14-1985   Cancelled Treatment:    Reason Eval/Treat Not Completed: PT screened, no needs identified, will sign off. Discussed pt case with OT who reports pt is currently mobilizing independently and does not require a formal PT evaluation at this time. PT signing off. If needs change, please reconsult.     [name] 11/05/2022, 10:46 AM  [name], PT, DPT Acute Rehabilitation Services Secure Chat Preferred Office: [phone]

## 2022-11-06 DIAGNOSIS — I639 Cerebral infarction, unspecified: Secondary | ICD-10-CM | POA: Diagnosis not present

## 2022-11-06 LAB — HOMOCYSTEINE: Homocysteine: 10.8 umol/L (ref 0.0–14.5)

## 2022-11-06 LAB — CBC
HCT: 39.6 % (ref 36.0–46.0)
Hemoglobin: 12.4 g/dL (ref 12.0–15.0)
MCH: 25.6 pg — ABNORMAL LOW (ref 26.0–34.0)
MCHC: 31.3 g/dL (ref 30.0–36.0)
MCV: 81.8 fL (ref 80.0–100.0)
Platelets: 305 10*3/uL (ref 150–400)
RBC: 4.84 MIL/uL (ref 3.87–5.11)
WBC: 14.4 10*3/uL — ABNORMAL HIGH (ref 4.0–10.5)
nRBC: 0 % (ref 0.0–0.2)

## 2022-11-06 LAB — BASIC METABOLIC PANEL
Anion gap: 9 (ref 5–15)
BUN: 10 mg/dL (ref 6–20)
CO2: 24 mmol/L (ref 22–32)
Calcium: 8.5 mg/dL — ABNORMAL LOW (ref 8.9–10.3)
Chloride: 101 mmol/L (ref 98–111)
Creatinine, Ser: 0.95 mg/dL (ref 0.44–1.00)
GFR, Estimated: >60 mL/min (ref >60)
Glucose, Bld: 95 mg/dL (ref 70–99)
Sodium: 134 mmol/L — ABNORMAL LOW (ref 135–145)

## 2022-11-06 LAB — PROTEIN C, TOTAL: Protein C, Total: 81 % (ref 60–150)

## 2022-11-06 NOTE — Evaluation (Signed)
Speech Language Pathology Evaluation Patient Details Name: Laura [name] MRN: [id number] DOB: 27-Dec-1985 Today's Date: 11/06/2022 Time: 4098-1191 SLP Time Calculation (min) (ACUTE ONLY): 14 min  Problem List:  Patient Active Problem List   Diagnosis Date Noted   Vision disturbance 11/05/2022   Acute ischemic stroke (HCC) 11/04/2022   Hepatic steatosis 02/05/2021   Moderate mixed hyperlipidemia not requiring statin therapy 02/05/2021   Chronic midline thoracic back pain 07/30/2020   Seborrheic dermatitis of scalp 07/30/2020   Iron deficiency anemia 06/13/2020   Polycystic ovary syndrome 03/30/2019   GAD (generalized anxiety disorder) 12/16/2018   Prediabetes 12/16/2018   Vitamin D deficiency 12/16/2018   Obesity 06/19/2016   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   Rheumatoid arthritis (HCC) 04/29/2012   Past Medical History:  Past Medical History:  Diagnosis Date   Anemia    Anxiety    Anxiety 12/31/2018   Arthritis    Depression    Depression    Phreesia 02/25/2020   Depressive disorder 03/28/2014   Establishing care with new doctor, encounter for 07/30/2020   Flatulence, eructation and gas pain 07/30/2020   Frequent headaches    History of chicken pox    Otitis media    had tubes as child, then according to pt adipose tissue used to repair ear drum   Rheumatoid arthritis (HCC)    UTI (lower urinary tract infection)    Past Surgical History:  Past Surgical History:  Procedure Laterality Date   APPENDECTOMY  2012   HYMENECTOMY  2000   TONSILECTOMY, ADENOIDECTOMY, BILATERAL MYRINGOTOMY AND TUBES     HPI:  Pt is a 37 y/o female presenting with visual changes. MRI brain showed acute infarct in R thalamus and possible punctate infarct in L thalamus. PMH: anemia, anxiety, depression, RA   Assessment / Plan / Recommendation Clinical Impression  Laura [name] participated in Designer, multimedia. She presented with normal  attention, memory, problem-solving, and reasoning. Speech is clear and fluent. Language is intact. Reading is WNL. No deficits identified. No SLP f/u is needed.    SLP Assessment  SLP Recommendation/Assessment: Patient does not need any further Speech Lanaguage Pathology Services SLP Visit Diagnosis: Cognitive communication deficit (R41.841)    Recommendations for follow up therapy are one component of a multi-disciplinary discharge planning process, led by the attending physician.  Recommendations may be updated based on patient status, additional functional criteria and insurance authorization.    Follow Up Recommendations  No SLP follow up    Assistance Recommended at Discharge  None                 SLP Evaluation Cognition  Overall Cognitive Status: Within Functional Limits for tasks assessed Arousal/Alertness: Awake/alert Orientation Level: Oriented X4 Attention: Alternating Alternating Attention: Appears intact Memory: Appears intact Awareness: Appears intact Problem Solving: Appears intact Safety/Judgment: Appears intact       Comprehension  Auditory Comprehension Overall Auditory Comprehension: Appears within functional limits for tasks assessed Reading Comprehension Reading Status: Within funtional limits    Expression Expression Primary Mode of Expression: Verbal Verbal Expression Overall Verbal Expression: Appears within functional limits for tasks assessed Written Expression Dominant Hand: Right   Oral / Motor  Oral Motor/Sensory Function Overall Oral Motor/Sensory Function: Within functional limits Motor Speech Overall Motor Speech: Appears within functional limits for tasks assessed            Laura [name] Laura 11/06/2022, 2:44 PM Laura [name] L. Samson Frederic, MA CCC/SLP Clinical Specialist - Acute Care  SLP Acute Rehabilitation Services Office number [phone]

## 2022-11-06 NOTE — Plan of Care (Signed)
  Problem: Education: Goal: Knowledge of disease or condition will improve Outcome: Progressing Goal: Knowledge of secondary prevention will improve (MUST DOCUMENT ALL) Outcome: Progressing Goal: Knowledge of patient specific risk factors will improve (Mark N/A or DELETE if not current risk factor) Outcome: Progressing   Problem: Ischemic Stroke/TIA Tissue Perfusion: Goal: Complications of ischemic stroke/TIA will be minimized Outcome: Progressing   Problem: Coping: Goal: Will verbalize positive feelings about self Outcome: Progressing Goal: Will identify appropriate support needs Outcome: Progressing   Problem: Health Behavior/Discharge Planning: Goal: Ability to manage health-related needs will improve Outcome: Progressing Goal: Goals will be collaboratively established with patient/family Outcome: Progressing   Problem: Self-Care: Goal: Ability to participate in self-care as condition permits will improve Outcome: Progressing Goal: Verbalization of feelings and concerns over difficulty with self-care will improve Outcome: Progressing Goal: Ability to communicate needs accurately will improve Outcome: Progressing   

## 2022-11-06 NOTE — Progress Notes (Addendum)
STROKE TEAM PROGRESS NOTE   BRIEF HPI [name] is a 37 y.o. female with history of anemia, depression, frequent headaches, otitis media and rheumatoid arthritis who presents to the [facility] ED after acute onset of double vision.  MRI shows a stroke in the medial aspect of the right thalamus.  Rheumatoid factor elevated in 12/2018. She is currently on humera and plaquenil for her rheumatoid arthritis. She has also seen hematology for low iron levels. 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    SIGNIFICANT HOSPITAL EVENTS 7/23- Admit to [facility]  7/24 - Echo with positive bubble study  INTERIM HISTORY/SUBJECTIVE Patient is seen in her room with 2 family members at the bedside.  She has been hemodynamically stable and has no complaints.  TEE and loop recorder placement scheduled for tomorrow.  Discussed possible participation in sleep smart trial.  OBJECTIVE  CBC    Component Value Date/Time   WBC 14.4 (H) 11/06/2022 0247   RBC 4.84 11/06/2022 0247   HGB 12.4 11/06/2022 0247   HGB 15.3 08/06/2021 1411   HCT 39.6 11/06/2022 0247   HCT 45.9 08/06/2021 1411   PLT 305 11/06/2022 0247   PLT 291 08/06/2021 1411   MCV 81.8 11/06/2022 0247   MCV 86 08/06/2021 1411   MCH 25.6 (L) 11/06/2022 0247   MCHC 31.3 11/06/2022 0247   RDW 13.0 11/06/2022 0247   RDW 11.6 (L) 08/06/2021 1411   LYMPHSABS 2.5 11/04/2022 1435   LYMPHSABS 2.1 08/06/2021 1411   MONOABS 0.7 11/04/2022 1435   EOSABS 0.1 11/04/2022 1435   EOSABS 0.1 08/06/2021 1411   BASOSABS 0.1 11/04/2022 1435   BASOSABS 0.1 08/06/2021 1411    BMET    Component Value Date/Time   NA 134 (L) 11/06/2022 0247   NA 144 08/06/2021 1411   K 4.4 11/06/2022 0247   CL 101 11/06/2022 0247   CO2 24 11/06/2022 0247   GLUCOSE 95 11/06/2022 0247   BUN 10 11/06/2022 0247   BUN 8 08/06/2021 1411   CREATININE 0.95 11/06/2022 0247   CREATININE 0.85 04/15/2022 1330    CREATININE 0.80 04/29/2012 1724   CALCIUM 8.5 (L) 11/06/2022 0247   EGFR 88 08/06/2021 1411   GFRNONAA >60 11/06/2022 0247   GFRNONAA >60 04/15/2022 1330    IMAGING past 24 hours VAS [place] LOWER EXTREMITY VENOUS (DVT)  Result Date: 11/05/2022  Lower Venous DVT Study Patient Name:  Laura [name]  Date of Exam:   11/05/2022 Medical Rec #: [id number]             Accession #:    [id number] Date of Birth: [birth year]             Patient Gender: F Patient Age:   52 years Exam Location:  [facility] Procedure:      VAS [place] LOWER EXTREMITY VENOUS (DVT) Referring Phys: Leticia Penna --------------------------------------------------------------------------------  Indications: Stroke.  Risk Factors: None identified. Comparison Study: No prior study Performing Technologist: Shona Simpson  Examination Guidelines: A complete evaluation includes B-mode imaging, spectral Doppler, color Doppler, and power Doppler as needed of all accessible portions of each vessel. Bilateral testing is considered an integral part of a complete examination. Limited examinations for reoccurring indications may be performed as noted. The reflux portion of the exam is performed with the patient in reverse Trendelenburg.  +---------+---------------+---------+-----------+----------+--------------+ RIGHT    CompressibilityPhasicitySpontaneityPropertiesThrombus Aging +---------+---------------+---------+-----------+----------+--------------+ CFV  Full           Yes      Yes                                 +---------+---------------+---------+-----------+----------+--------------+ SFJ      Full                                                        +---------+---------------+---------+-----------+----------+--------------+ FV Prox  Full                                                        +---------+---------------+---------+-----------+----------+--------------+ FV Mid   Full                                                         +---------+---------------+---------+-----------+----------+--------------+ FV DistalFull                                                        +---------+---------------+---------+-----------+----------+--------------+ PFV      Full                                                        +---------+---------------+---------+-----------+----------+--------------+ POP      Full           Yes      Yes                                 +---------+---------------+---------+-----------+----------+--------------+ PTV      Full                                                        +---------+---------------+---------+-----------+----------+--------------+ PERO     Full                                                        +---------+---------------+---------+-----------+----------+--------------+   +---------+---------------+---------+-----------+----------+--------------+ LEFT     CompressibilityPhasicitySpontaneityPropertiesThrombus Aging +---------+---------------+---------+-----------+----------+--------------+ CFV      Full           Yes      Yes                                 +---------+---------------+---------+-----------+----------+--------------+  SFJ      Full                                                        +---------+---------------+---------+-----------+----------+--------------+ FV Prox  Full                                                        +---------+---------------+---------+-----------+----------+--------------+ FV Mid   Full                                                        +---------+---------------+---------+-----------+----------+--------------+ FV DistalFull                                                        +---------+---------------+---------+-----------+----------+--------------+ PFV      Full                                                         +---------+---------------+---------+-----------+----------+--------------+ POP      Full           Yes      Yes                                 +---------+---------------+---------+-----------+----------+--------------+ PTV      Full                                                        +---------+---------------+---------+-----------+----------+--------------+ PERO     Full                                                        +---------+---------------+---------+-----------+----------+--------------+     Summary: BILATERAL: - No evidence of deep vein thrombosis seen in the lower extremities, bilaterally. -No evidence of popliteal cyst, bilaterally.   *See table(s) above for measurements and observations. Electronically signed by Heath Lark on 11/05/2022 at 7:43:41 PM.    Final     Vitals:   11/05/22 1534 11/05/22 1945 11/06/22 0325 11/06/22 0757  BP: 125/77 123/83 112/64 119/77  Pulse: 62 68 62 65  Resp: 20   18  Temp: 98 F (36.7 C) 98.6 F (37 C) 98.2 F (36.8 C) 97.7 F (36.5 C)  TempSrc: Oral Oral Oral Oral  SpO2: 96% 96% 96% 94%  Weight:      Height:         PHYSICAL EXAM General:  Alert, well-nourished, well-developed patient in no acute distress Psych:  Mood and affect appropriate for situation CV: Regular rate and rhythm on monitor Respiratory:  Regular, unlabored respirations on room air GI: Abdomen soft and nontender   NEURO:  Mental Status: AA&Ox3, patient is able to give clear and coherent history Speech/Language: speech is without dysarthria or aphasia.    Cranial Nerves:  II: PERRL.  III, IV, VI: EOMI. Eyelids elevate symmetrically.  VII: Face is symmetrical resting and smiling VIII: hearing intact to voice. IX, X: Phonation is normal.  XII: tongue is midline without fasciculations. Motor: 5/5 strength to all muscle groups tested.  Tone: is normal and bulk is normal Sensation- Intact to light touch bilaterally.  Coordination: FTN  intact bilaterally.No drift.  Gait- deferred  NIHSS: 0   ASSESSMENT/PLAN  Acute Ischemic Infarct:  right thalamic infarct, possible punctate infarct in medial aspect of left thalamus  Etiology:  cryptogenic  Code Stroke CT head No acute abnormality.  CTA head & neck Normal CTA head and neck MRI  Acute infarct in the medial aspect of the right thalamus. Possible punctate acute infarct in the medial aspect of the left thalamus. No acute hemorrhage or significant mass effect.  Lower extremity venous duplex - negative for DVT 2D Echo with bubble study - EF 60-65%, bubble study positive  LDL 139 HgbA1c 5.5 Hypercoagulable work up pending Sed rate Prothrombin gene mutation Factor 5 Homocysteine CRP - 0.9 Cardiolipin antibodies Lupus Protein S Protein C Antithrombin III- 95 VTE prophylaxis - lovenox No antithrombotic prior to admission, now on aspirin 81 mg daily and clopidogrel 75 mg daily for 3 weeks and then ASA 81mg  alone. Therapy recommendations:  No follow up needed Disposition:  Home Blood Pressure Goal: BP less than 220/110   Hyperlipidemia LDL 139, goal < 70 Add Atorvastatin 40mg   Continue statin at discharge  Other Stroke Risk Factors Obesity, Body mass index is 40.35 kg/m., BMI >/= 30 associated with increased stroke risk, recommend weight loss, diet and exercise as appropriate  Rheumatoid Arthritis Humera and plaquenil  Other Active Problems Depression Lexapro  Hospital day # 1  Patient seen and examined by NP/APP with MD. MD to update note as needed.   Cortney E Ernestina Columbia , MSN, AGACNP-BC [facility] See Amion for schedule and pager information 11/06/2022 10:38 AM  ATTENDING ATTESTATION:  37 year old with cryptogenic right thalamic stroke.  Unclear if there is a left thalamic stroke or artifact suggesting the possibility of cardioembolic source.  Young stroke workup ongoing.  Echo shows positive bubble study.  Scheduled for TEE tomorrow  as well as loop recorder.  Upon further discussion she mentions that she has apneic breathing at night.  It is possible that she has sleep apnea which is a risk factor for stroke.  Her exam is back to baseline with no deficits.  Continue DAPT therapy for 3 weeks then aspirin alone.  Discussed with Dr. Pearlean Brownie her interest in participating with sleep smart research trial.  Information was given to her to read and his research coordinator will come speak with patient later today.  Discussed with the hospitalist, Dr. Renford Dills and understands that she will need to stay her 2 more nights for the study for apnea testing.  I discussed with her that research is voluntary and that we will continue the same excellent care regardless if she participates.  Dr.  Loretto Belinsky evaluated pt independently, reviewed imaging, chart, labs. Discussed and formulated plan with the Resident/APP. Changes were made to the note where appropriate. Please see APP/resident note above for details.    Sonita Michiels,MD     To contact Stroke Continuity provider, please refer to [email or web address]. After hours, contact General Neurology

## 2022-11-06 NOTE — Plan of Care (Signed)
  Problem: Education: Goal: Knowledge of disease or condition will improve 11/06/2022 0500 by Lanelle Bal, RN Outcome: Progressing 11/06/2022 0402 by Lanelle Bal, RN Outcome: Progressing Goal: Knowledge of secondary prevention will improve (MUST DOCUMENT ALL) 11/06/2022 0500 by Lanelle Bal, RN Outcome: Progressing 11/06/2022 0402 by Lanelle Bal, RN Outcome: Progressing Goal: Knowledge of patient specific risk factors will improve Loraine Leriche N/A or DELETE if not current risk factor) 11/06/2022 0500 by Lanelle Bal, RN Outcome: Progressing 11/06/2022 0402 by Lanelle Bal, RN Outcome: Progressing   Problem: Ischemic Stroke/TIA Tissue Perfusion: Goal: Complications of ischemic stroke/TIA will be minimized 11/06/2022 0500 by Lanelle Bal, RN Outcome: Progressing 11/06/2022 0402 by Lanelle Bal, RN Outcome: Progressing   Problem: Health Behavior/Discharge Planning: Goal: Ability to manage health-related needs will improve 11/06/2022 0500 by Lanelle Bal, RN Outcome: Progressing 11/06/2022 0402 by Lanelle Bal, RN Outcome: Progressing Goal: Goals will be collaboratively established with patient/family 11/06/2022 0500 by Lanelle Bal, RN Outcome: Progressing 11/06/2022 0402 by Lanelle Bal, RN Outcome: Progressing

## 2022-11-06 NOTE — H&P (View-Only) (Signed)
PROGRESS NOTE  Laura [name]  ZOX:[id number] DOB: 1985/06/30 DOA: 11/04/2022 PCP: Tollie Eth, NP   Brief Narrative: Patient is [name] 37 year old female with history of arthritis, anxiety who presented with visual disturbance complaining of double vision which resolves when closing 1 eye.  Also reported difficulty with balance.  On presentation, she was hemodynamically stable.  Head CT, CTA head and neck did not show any acute findings.  MRI of the brain showed acute infarct in the medial aspect of the right thalamus,possible punctate acute infarct in the medial aspect of the left thalamus.  Admitted for stroke workup.  Neurology consulted.  Plan for TEE,loop recorder placement  Assessment & Plan:  Principal Problem:   Acute ischemic stroke [facility]) Active Problems:   Rheumatoid arthritis (HCC)   GAD (generalized anxiety disorder)   Vision disturbance   Acute ischemic stroke/visual disturbance: Presented with double vision, difficulty with balance. Head CT, CTA head and neck did not show any acute findings.  MRI of the brain showed acute infarct in the medial aspect of the right thalamus,possible punctate acute infarct in the medial aspect of the left thalamus.  Admitted for stroke workup.  Neurology consulted and following LDL of 139, A1c of 5.5. Echo showed EF of 60 to 60%, no wall motion abnormality, agitated saline contrast bubble study was positive suggesting intra-atrial shunt.  Plan for TEE,loop recorder.  Sent hypercoagulable workup PT/OT signed off. Started on aspirin, Plavix: Plan for DAPT for 3 weeks followed by aspirin alone.  Started on Lipitor  Rheumatoid arthritis: On Plaquenil  Anxiety: On Lexapro  Leukocytosis: This is most likely reactive.  Will check CBC tomorrow  Morbid obesity: BMI 40.3.  She might need to have sleep study to rule out sleep apnea as an outpatient          DVT prophylaxis:SCD's Start: 11/04/22 1715     Code Status: Full Code  Family  Communication: Father at bedside on 7/24  Patient status:Inpatient  Patient is from :home  Anticipated discharge WJ:XBJY  Estimated DC date:After  Full stroke workup,tomorrow   Consultants: Neurology  Procedures: None  Antimicrobials:  Anti-infectives (From admission, onward)    Start     Dose/Rate Route Frequency Ordered Stop   11/04/22 2200  hydroxychloroquine (PLAQUENIL) tablet 200 mg        200 mg Oral 2 times daily 11/04/22 1844         Subjective: Patient seen and examined at bedside today.  Medically stable.  Comfortable.  Has some intermittent blurry vision. no new numbness or weakness  Objective: Vitals:   11/05/22 1945 11/06/22 0325 11/06/22 0757 11/06/22 1217  BP: 123/83 112/64 119/77 122/81  Pulse: 68 62 65 67  Resp:   18 18  Temp: 98.6 F (37 C) 98.2 F (36.8 C) 97.7 F (36.5 C) 98.2 F (36.8 C)  TempSrc: Oral Oral Oral Oral  SpO2: 96% 96% 94% 94%  Weight:      Height:       No intake or output data in the 24 hours ending 11/06/22 1332  Filed Weights   11/04/22 1425  Weight: 113.4 kg    Examination:  General exam: Overall comfortable, not in distress,morbid obesity HEENT: PERRL Respiratory system:  no wheezes or crackles  Cardiovascular system: S1 & S2 heard, RRR.  Gastrointestinal system: Abdomen is nondistended, soft and nontender. Central nervous system: Alert and oriented Extremities: No edema, no clubbing ,no cyanosis Skin: No rashes, no ulcers,no icterus     Data  Reviewed: I have personally reviewed following labs and imaging studies  CBC: Recent Labs  Lab 11/04/22 1435 11/04/22 1520 11/06/22 0247  WBC 11.3*  --  14.4*  NEUTROABS 7.9*  --   --   HGB 13.8 13.3 12.4  HCT 44.3 39.0 39.6  MCV 84.1  --  81.8  PLT 320  --  305   Basic Metabolic Panel: Recent Labs  Lab 11/04/22 1435 11/04/22 1520 11/06/22 0247  NA 135 138 134*  K 3.7 3.4* 4.4  CL 105 105 101  CO2 23  --  24  GLUCOSE 103* 88 95  BUN 12 11 10    CREATININE 0.88 0.80 0.95  CALCIUM 8.6*  --  8.5*     No results found for this or any previous visit (from the past 240 hour(s)).   Radiology Studies: VAS [place] LOWER EXTREMITY VENOUS (DVT)  Result Date: 11/05/2022  Lower Venous DVT Study Patient Name:  Laura [name]  Date of Exam:   11/05/2022 Medical Rec #: [id number]             Accession #:    [id number] Date of Birth: 09-10-1985             Patient Gender: F Patient Age:   39 years Exam Location:  [facility] Procedure:      VAS [place] LOWER EXTREMITY VENOUS (DVT) Referring Phys: Leticia Penna --------------------------------------------------------------------------------  Indications: Stroke.  Risk Factors: None identified. Comparison Study: No prior study Performing Technologist: Shona Simpson  Examination Guidelines: [name] complete evaluation includes B-mode imaging, spectral Doppler, color Doppler, and power Doppler as needed of all accessible portions of each vessel. Bilateral testing is considered an integral part of [name] complete examination. Limited examinations for reoccurring indications may be performed as noted. The reflux portion of the exam is performed with the patient in reverse Trendelenburg.  +---------+---------------+---------+-----------+----------+--------------+ RIGHT    CompressibilityPhasicitySpontaneityPropertiesThrombus Aging +---------+---------------+---------+-----------+----------+--------------+ CFV      Full           Yes      Yes                                 +---------+---------------+---------+-----------+----------+--------------+ SFJ      Full                                                        +---------+---------------+---------+-----------+----------+--------------+ FV Prox  Full                                                        +---------+---------------+---------+-----------+----------+--------------+ FV Mid   Full                                                         +---------+---------------+---------+-----------+----------+--------------+ FV DistalFull                                                        +---------+---------------+---------+-----------+----------+--------------+  PFV      Full                                                        +---------+---------------+---------+-----------+----------+--------------+ POP      Full           Yes      Yes                                 +---------+---------------+---------+-----------+----------+--------------+ PTV      Full                                                        +---------+---------------+---------+-----------+----------+--------------+ PERO     Full                                                        +---------+---------------+---------+-----------+----------+--------------+   +---------+---------------+---------+-----------+----------+--------------+ LEFT     CompressibilityPhasicitySpontaneityPropertiesThrombus Aging +---------+---------------+---------+-----------+----------+--------------+ CFV      Full           Yes      Yes                                 +---------+---------------+---------+-----------+----------+--------------+ SFJ      Full                                                        +---------+---------------+---------+-----------+----------+--------------+ FV Prox  Full                                                        +---------+---------------+---------+-----------+----------+--------------+ FV Mid   Full                                                        +---------+---------------+---------+-----------+----------+--------------+ FV DistalFull                                                        +---------+---------------+---------+-----------+----------+--------------+ PFV      Full                                                         +---------+---------------+---------+-----------+----------+--------------+  POP      Full           Yes      Yes                                 +---------+---------------+---------+-----------+----------+--------------+ PTV      Full                                                        +---------+---------------+---------+-----------+----------+--------------+ PERO     Full                                                        +---------+---------------+---------+-----------+----------+--------------+     Summary: BILATERAL: - No evidence of deep vein thrombosis seen in the lower extremities, bilaterally. -No evidence of popliteal cyst, bilaterally.   *See table(s) above for measurements and observations. Electronically signed by Heath Lark on 11/05/2022 at 7:43:41 PM.    Final    ECHOCARDIOGRAM COMPLETE BUBBLE STUDY  Result Date: 11/05/2022    ECHOCARDIOGRAM REPORT   Patient Name:   Laura [name] Date of Exam: 11/05/2022 Medical Rec #:  [id number]            Height:       66.0 in Accession #:    [id number]           Weight:       250.0 lb Date of Birth:  1985/04/30            BSA:          2.199 m Patient Age:    37 years             BP:           127/76 mmHg Patient Gender: F                    HR:           67 bpm. Exam Location:  Inpatient Procedure: 2D Echo, Cardiac Doppler, Color Doppler and Saline Contrast Bubble            Study Indications:   Stroke 434.91 / I63.9  History:       Patient has no prior history of Echocardiogram examinations.                Stroke.  Sonographer:   Lucendia Herrlich Referring      [id number] [name] Phys: IMPRESSIONS  1. Left ventricular ejection fraction, by estimation, is 60 to 65%. The left ventricle has normal function. The left ventricle has no regional wall motion abnormalities. Left ventricular diastolic parameters were normal.  2. Right ventricular systolic function is normal. The right ventricular size is normal. There is normal  pulmonary artery systolic pressure.  3. The mitral valve is normal in structure. Trivial mitral valve regurgitation. No evidence of mitral stenosis.  4. The aortic valve is normal in structure. Aortic valve regurgitation is not visualized. No aortic stenosis is present.  5. The inferior vena cava is normal in size with greater than 50% respiratory variability, suggesting right atrial  pressure of 3 mmHg.  6. Agitated saline contrast bubble study was positive with shunting observed within 3-6 cardiac cycles suggestive of interatrial shunt. Comparison(s): No prior Echocardiogram. Conclusion(s)/Recommendation(s): Positive bubble study (seen within 3 beats). Consider TEE for further evaluation if clinically indicated. FINDINGS  Left Ventricle: Left ventricular ejection fraction, by estimation, is 60 to 65%. The left ventricle has normal function. The left ventricle has no regional wall motion abnormalities. The left ventricular internal cavity size was normal in size. There is  no left ventricular hypertrophy. Left ventricular diastolic parameters were normal. Right Ventricle: The right ventricular size is normal. No increase in right ventricular wall thickness. Right ventricular systolic function is normal. There is normal pulmonary artery systolic pressure. The tricuspid regurgitant velocity is 1.25 m/s, and  with an assumed right atrial pressure of 3 mmHg, the estimated right ventricular systolic pressure is 9.2 mmHg. Left Atrium: Left atrial size was normal in size. Right Atrium: Right atrial size was normal in size. Pericardium: There is no evidence of pericardial effusion. Mitral Valve: The mitral valve is normal in structure. Trivial mitral valve regurgitation. No evidence of mitral valve stenosis. Tricuspid Valve: The tricuspid valve is normal in structure. Tricuspid valve regurgitation is trivial. No evidence of tricuspid stenosis. Aortic Valve: The aortic valve is normal in structure. Aortic valve regurgitation  is not visualized. No aortic stenosis is present. Aortic valve peak gradient measures 7.8 mmHg. Pulmonic Valve: The pulmonic valve was not well visualized. Pulmonic valve regurgitation is trivial. No evidence of pulmonic stenosis. Aorta: The aortic root, ascending aorta, aortic arch and descending aorta are all structurally normal, with no evidence of dilitation or obstruction. Venous: The inferior vena cava is normal in size with greater than 50% respiratory variability, suggesting right atrial pressure of 3 mmHg. IAS/Shunts: No atrial level shunt detected by color flow Doppler. Agitated saline contrast was given intravenously to evaluate for intracardiac shunting. Agitated saline contrast bubble study was positive with shunting observed within 3-6 cardiac cycles suggestive of interatrial shunt.  LEFT VENTRICLE PLAX 2D LVIDd:         4.60 cm   Diastology LVIDs:         3.00 cm   LV e' medial:    11.00 cm/s LV PW:         0.80 cm   LV E/e' medial:  8.0 LV IVS:        0.80 cm   LV e' lateral:   12.70 cm/s LVOT diam:     2.20 cm   LV E/e' lateral: 6.9 LV SV:         77 LV SV Index:   35 LVOT Area:     3.80 cm                           3D Volume EF:                          3D EF:        63 %                          LV EDV:       149 ml                          LV ESV:       55 ml  LV SV:        94 ml RIGHT VENTRICLE             IVC RV S prime:     13.30 cm/s  IVC diam: 1.90 cm TAPSE (M-mode): 2.4 cm LEFT ATRIUM           Index        RIGHT ATRIUM           Index LA diam:      3.50 cm 1.59 cm/m   RA Area:     12.00 cm LA Vol (A2C): 43.7 ml 19.88 ml/m  RA Volume:   21.50 ml  9.78 ml/m LA Vol (A4C): 47.9 ml 21.79 ml/m  AORTIC VALVE AV Area (Vmax): 2.58 cm AV Vmax:        140.00 cm/s AV Peak Grad:   7.8 mmHg LVOT Vmax:      95.20 cm/s LVOT Vmean:     61.800 cm/s LVOT VTI:       0.203 m  AORTA Ao Root diam: 3.00 cm Ao Asc diam:  3.00 cm MITRAL VALVE                TRICUSPID VALVE MV Area  (PHT): 3.17 cm     TR Peak grad:   6.2 mmHg MV Decel Time: 239 msec     TR Vmax:        125.00 cm/s MR Peak grad: 6.8 mmHg MR Vmax:      130.00 cm/s   SHUNTS MV E velocity: 87.50 cm/s   Systemic VTI:  0.20 m MV [name] velocity: 104.00 cm/s  Systemic Diam: 2.20 cm MV E/[name] ratio:  0.84 Jodelle Red MD Electronically signed by Jodelle Red MD Signature Date/Time: 11/05/2022/11:09:04 AM    Final    MR BRAIN W WO CONTRAST  Result Date: 11/04/2022 CLINICAL DATA:  Neuro deficit, acute, stroke suspected; Double vision. EXAM: MRI HEAD AND ORBITS WITHOUT AND WITH CONTRAST TECHNIQUE: Multiplanar, multiecho pulse sequences of the brain and surrounding structures were obtained without and with intravenous contrast. Multiplanar, multiecho pulse sequences of the orbits and surrounding structures were obtained including fat saturation techniques, before and after intravenous contrast administration. CONTRAST:  10mL GADAVIST GADOBUTROL 1 MMOL/ML IV SOLN COMPARISON:  Head CT and CTA head/neck 11/04/2022. FINDINGS: MRI HEAD FINDINGS Brain: Acute infarct in the medial aspect of the right thalamus (axial image 25 series 8, coronal image 19 series 6). Possible punctate acute infarct in the medial aspect of the left thalamus (axial image 25 series 8). No acute hemorrhage or significant mass effect. No hydrocephalus or extra-axial collection. No mass or abnormal enhancement. No foci of abnormal susceptibility. Vascular: Normal flow voids and vessel enhancement. Skull and upper cervical spine: Normal marrow signal and enhancement. Other: None. MRI ORBITS FINDINGS Orbits: No traumatic or inflammatory finding. Globes, optic nerves, orbital fat, extraocular muscles, vascular structures, and lacrimal glands are normal. Visualized sinuses: Clear. Soft tissues: Unremarkable. IMPRESSION: 1. Acute infarct in the medial aspect of the right thalamus. Possible punctate acute infarct in the medial aspect of the left thalamus. No acute  hemorrhage or significant mass effect. 2. Normal MRI of the orbits. Electronically Signed   By: Orvan Falconer M.D.   On: 11/04/2022 18:45   MR ORBITS W WO CONTRAST  Result Date: 11/04/2022 CLINICAL DATA:  Neuro deficit, acute, stroke suspected; Double vision. EXAM: MRI HEAD AND ORBITS WITHOUT AND WITH CONTRAST TECHNIQUE: Multiplanar, multiecho pulse sequences of the brain and surrounding structures were obtained  without and with intravenous contrast. Multiplanar, multiecho pulse sequences of the orbits and surrounding structures were obtained including fat saturation techniques, before and after intravenous contrast administration. CONTRAST:  10mL GADAVIST GADOBUTROL 1 MMOL/ML IV SOLN COMPARISON:  Head CT and CTA head/neck 11/04/2022. FINDINGS: MRI HEAD FINDINGS Brain: Acute infarct in the medial aspect of the right thalamus (axial image 25 series 8, coronal image 19 series 6). Possible punctate acute infarct in the medial aspect of the left thalamus (axial image 25 series 8). No acute hemorrhage or significant mass effect. No hydrocephalus or extra-axial collection. No mass or abnormal enhancement. No foci of abnormal susceptibility. Vascular: Normal flow voids and vessel enhancement. Skull and upper cervical spine: Normal marrow signal and enhancement. Other: None. MRI ORBITS FINDINGS Orbits: No traumatic or inflammatory finding. Globes, optic nerves, orbital fat, extraocular muscles, vascular structures, and lacrimal glands are normal. Visualized sinuses: Clear. Soft tissues: Unremarkable. IMPRESSION: 1. Acute infarct in the medial aspect of the right thalamus. Possible punctate acute infarct in the medial aspect of the left thalamus. No acute hemorrhage or significant mass effect. 2. Normal MRI of the orbits. Electronically Signed   By: Orvan Falconer M.D.   On: 11/04/2022 18:45   CT HEAD CODE STROKE WO CONTRAST  Result Date: 11/04/2022 CLINICAL DATA:  Code stroke. Sudden onset vision changes. Double  vision in right eye. EXAM: CT ANGIOGRAPHY HEAD AND NECK CT PERFUSION BRAIN TECHNIQUE: Multidetector CT imaging of the head and neck was performed using the standard protocol during bolus administration of intravenous contrast. Multiplanar CT image reconstructions and MIPs were obtained to evaluate the vascular anatomy. Carotid stenosis measurements (when applicable) are obtained utilizing NASCET criteria, using the distal internal carotid diameter as the denominator. Multiphase CT imaging of the brain was performed following IV bolus contrast injection. Subsequent parametric perfusion maps were calculated using RAPID software. RADIATION DOSE REDUCTION: This exam was performed according to the departmental dose-optimization program which includes automated exposure control, adjustment of the mA and/or kV according to patient size and/or use of iterative reconstruction technique. CONTRAST:  OMNIPAQUE IOHEXOL 350 MG/ML SOLN COMPARISON:  None Available. FINDINGS: CT HEAD FINDINGS Brain: There is no acute intracranial hemorrhage, extra-axial fluid collection, or acute infarct. Parenchymal volume is normal. The ventricles are normal in size. Gray-white differentiation is preserved The pituitary and suprasellar region are normal. There is no mass lesion. There is no mass effect or midline shift. Vascular: No hyperdense vessel or unexpected calcification. Skull: Normal. Negative for fracture or focal lesion. Sinuses/Orbits: The paranasal sinuses are clear. The globes and orbits are unremarkable. Other: The mastoid air cells and middle ear cavities are clear. ASPECTS [facility] Stroke Program Early CT Score) - Ganglionic level infarction (caudate, lentiform nuclei, internal capsule, insula, M1-M3 cortex): 7 - Supraganglionic infarction (M4-M6 cortex): 3 Total score (0-10 with 10 being normal): 10 CTA NECK FINDINGS Aortic arch: The imaged aortic arch is normal. The origins of the major branch vessels are patent. The  subclavian arteries are patent to the level imaged. Right carotid system: The right common, internal, and external carotid arteries are patent, without hemodynamically significant stenosis or occlusion there is no evidence of dissection or aneurysm. Left carotid system: The left common, internal, and external carotid arteries are patent, without hemodynamically significant stenosis or occlusion. There is no evidence of dissection or aneurysm. Vertebral arteries: The vertebral arteries are patent, without hemodynamically significant stenosis or occlusion there is no evidence of dissection or aneurysm. Skeleton: There is no acute osseous abnormality or  suspicious osseous lesion. There is no visible canal hematoma. Other neck: The soft tissues of the neck are unremarkable. Upper chest: The imaged lung apices are clear. Review of the MIP images confirms the above findings CTA HEAD FINDINGS Anterior circulation: The intracranial ICAs are normal. The ophthalmic arteries are identified bilaterally. The bilateral MCAs and ACAS are normal. The anterior communicating artery is normal. There is no aneurysm or AVM. Posterior circulation: The bilateral V4 segments are normal. The basilar artery is normal. The major cerebellar arteries appear patent. The bilateral PCAs are normal. Bilateral posterior communicating arteries are identified with [name] predominantly fetal origin on the right. There is no aneurysm or AVM. Venous sinuses: Patent. Anatomic variants: As above. Review of the MIP images confirms the above findings CT Brain Perfusion Findings: ASPECTS: 10 CBF (<30%) Volume: 0mL Perfusion (Tmax>6.0s) volume: 0mL Mismatch Volume: 0mL Infarction Location:N/[name] IMPRESSION: 1. Normal noncontrast head CT. 2. Normal CTA of the neck. 3. Negative CT perfusion. The results of the initial noncontrast head CT were called by telephone at the time of interpretation on 11/04/2022 at 2:51 pm to provider Dr Earlene Plater , who verbally acknowledged these  results. Electronically Signed   By: Lesia Hausen M.D.   On: 11/04/2022 15:08   CT ANGIO HEAD NECK W WO CM W PERF (CODE STROKE)  Result Date: 11/04/2022 CLINICAL DATA:  Code stroke. Sudden onset vision changes. Double vision in right eye. EXAM: CT ANGIOGRAPHY HEAD AND NECK CT PERFUSION BRAIN TECHNIQUE: Multidetector CT imaging of the head and neck was performed using the standard protocol during bolus administration of intravenous contrast. Multiplanar CT image reconstructions and MIPs were obtained to evaluate the vascular anatomy. Carotid stenosis measurements (when applicable) are obtained utilizing NASCET criteria, using the distal internal carotid diameter as the denominator. Multiphase CT imaging of the brain was performed following IV bolus contrast injection. Subsequent parametric perfusion maps were calculated using RAPID software. RADIATION DOSE REDUCTION: This exam was performed according to the departmental dose-optimization program which includes automated exposure control, adjustment of the mA and/or kV according to patient size and/or use of iterative reconstruction technique. CONTRAST:  OMNIPAQUE IOHEXOL 350 MG/ML SOLN COMPARISON:  None Available. FINDINGS: CT HEAD FINDINGS Brain: There is no acute intracranial hemorrhage, extra-axial fluid collection, or acute infarct. Parenchymal volume is normal. The ventricles are normal in size. Gray-white differentiation is preserved The pituitary and suprasellar region are normal. There is no mass lesion. There is no mass effect or midline shift. Vascular: No hyperdense vessel or unexpected calcification. Skull: Normal. Negative for fracture or focal lesion. Sinuses/Orbits: The paranasal sinuses are clear. The globes and orbits are unremarkable. Other: The mastoid air cells and middle ear cavities are clear. ASPECTS [facility] Stroke Program Early CT Score) - Ganglionic level infarction (caudate, lentiform nuclei, internal capsule, insula, M1-M3  cortex): 7 - Supraganglionic infarction (M4-M6 cortex): 3 Total score (0-10 with 10 being normal): 10 CTA NECK FINDINGS Aortic arch: The imaged aortic arch is normal. The origins of the major branch vessels are patent. The subclavian arteries are patent to the level imaged. Right carotid system: The right common, internal, and external carotid arteries are patent, without hemodynamically significant stenosis or occlusion there is no evidence of dissection or aneurysm. Left carotid system: The left common, internal, and external carotid arteries are patent, without hemodynamically significant stenosis or occlusion. There is no evidence of dissection or aneurysm. Vertebral arteries: The vertebral arteries are patent, without hemodynamically significant stenosis or occlusion there is no evidence of dissection  or aneurysm. Skeleton: There is no acute osseous abnormality or suspicious osseous lesion. There is no visible canal hematoma. Other neck: The soft tissues of the neck are unremarkable. Upper chest: The imaged lung apices are clear. Review of the MIP images confirms the above findings CTA HEAD FINDINGS Anterior circulation: The intracranial ICAs are normal. The ophthalmic arteries are identified bilaterally. The bilateral MCAs and ACAS are normal. The anterior communicating artery is normal. There is no aneurysm or AVM. Posterior circulation: The bilateral V4 segments are normal. The basilar artery is normal. The major cerebellar arteries appear patent. The bilateral PCAs are normal. Bilateral posterior communicating arteries are identified with [name] predominantly fetal origin on the right. There is no aneurysm or AVM. Venous sinuses: Patent. Anatomic variants: As above. Review of the MIP images confirms the above findings CT Brain Perfusion Findings: ASPECTS: 10 CBF (<30%) Volume: 0mL Perfusion (Tmax>6.0s) volume: 0mL Mismatch Volume: 0mL Infarction Location:N/[name] IMPRESSION: 1. Normal noncontrast head CT. 2. Normal  CTA of the neck. 3. Negative CT perfusion. The results of the initial noncontrast head CT were called by telephone at the time of interpretation on 11/04/2022 at 2:51 pm to provider Dr Earlene Plater , who verbally acknowledged these results. Electronically Signed   By: Lesia Hausen M.D.   On: 11/04/2022 15:08    Scheduled Meds:  aspirin  81 mg Oral Daily   Or   aspirin  300 mg Rectal Daily   atorvastatin  40 mg Oral Daily   clopidogrel  75 mg Oral Daily   escitalopram  20 mg Oral QHS   hydroxychloroquine  200 mg Oral BID   Continuous Infusions:   LOS: 1 day   Burnadette Pop, MD [facility] P7/25/2024, 1:32 PM

## 2022-11-06 NOTE — Progress Notes (Signed)
   [facility] has been requested to perform a transesophageal echocardiogram on Hubert N Ciavarella for stroke.  After careful review of history and examination, the risks and benefits of transesophageal echocardiogram have been explained including risks of esophageal damage, perforation (1:10,000 risk), bleeding, pharyngeal hematoma as well as other potential complications associated with anesthesia including aspiration, arrhythmia, respiratory failure and death. Alternatives to treatment were discussed, questions were answered. Patient is willing to proceed.   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 Pending stroke work up including TEE. 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 Never had major bleeding. Follows oncology as outpatient. On iron therapy previously, has not taken Iron therapy for 2 years per patient. Lowest hgb in 2022 was 10.7, Hgb normal now at 12.4. Platelet normal. Vital sign stable. 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     [place], [place] 11/06/2022 11:12 AM

## 2022-11-06 NOTE — Progress Notes (Addendum)
PROGRESS NOTE  Laura [name]  UEA:[id number] DOB: 05/07/1985 DOA: 11/04/2022 PCP: Laura Eth, NP   Brief Narrative: Patient is a 37 year old female with history of arthritis, anxiety who presented with visual disturbance complaining of double vision which resolves when closing 1 eye.  Also reported difficulty with balance.  On presentation, she was hemodynamically stable.  Head CT, CTA head and neck did not show any acute findings.  MRI of the brain showed acute infarct in the medial aspect of the right thalamus,possible punctate acute infarct in the medial aspect of the left thalamus.  Admitted for stroke workup.  Neurology consulted.  Plan for TEE,loop recorder placement  Assessment & Plan:  Principal Problem:   Acute ischemic stroke [facility]) Active Problems:   Rheumatoid arthritis (HCC)   GAD (generalized anxiety disorder)   Vision disturbance   Acute ischemic stroke/visual disturbance: Presented with double vision, difficulty with balance. Head CT, CTA head and neck did not show any acute findings.  MRI of the brain showed acute infarct in the medial aspect of the right thalamus,possible punctate acute infarct in the medial aspect of the left thalamus.  Admitted for stroke workup.  Neurology consulted and following LDL of 139, A1c of 5.5. Echo showed EF of 60 to 60%, no wall motion abnormality, agitated saline contrast bubble study was positive suggesting intra-atrial shunt.  Plan for TEE,loop recorder.  Sent hypercoagulable workup PT/OT signed off. Started on aspirin, Plavix: Plan for DAPT for 3 weeks followed by aspirin alone.  Started on Lipitor  Rheumatoid arthritis: On Plaquenil  Anxiety: On Lexapro  Leukocytosis: This is most likely reactive.  Will check CBC tomorrow  Morbid obesity: BMI 40.3.  She might need to have sleep study to rule out sleep apnea as an outpatient          DVT prophylaxis:SCD's Start: 11/04/22 1715     Code Status: Full Code  Family  Communication: Father at bedside on 7/24  Patient status:Inpatient  Patient is from :home  Anticipated discharge YN:WGNF  Estimated DC date:After  Full stroke workup,tomorrow   Consultants: Neurology  Procedures: None  Antimicrobials:  Anti-infectives (From admission, onward)    Start     Dose/Rate Route Frequency Ordered Stop   11/04/22 2200  hydroxychloroquine (PLAQUENIL) tablet 200 mg        200 mg Oral 2 times daily 11/04/22 1844         Subjective: Patient seen and examined at bedside today.  Medically stable.  Comfortable.  Has some intermittent blurry vision. no new numbness or weakness  Objective: Vitals:   11/05/22 1945 11/06/22 0325 11/06/22 0757 11/06/22 1217  BP: 123/83 112/64 119/77 122/81  Pulse: 68 62 65 67  Resp:   18 18  Temp: 98.6 F (37 C) 98.2 F (36.8 C) 97.7 F (36.5 C) 98.2 F (36.8 C)  TempSrc: Oral Oral Oral Oral  SpO2: 96% 96% 94% 94%  Weight:      Height:       No intake or output data in the 24 hours ending 11/06/22 1332  Filed Weights   11/04/22 1425  Weight: 113.4 kg    Examination:  General exam: Overall comfortable, not in distress,morbid obesity HEENT: PERRL Respiratory system:  no wheezes or crackles  Cardiovascular system: S1 & S2 heard, RRR.  Gastrointestinal system: Abdomen is nondistended, soft and nontender. Central nervous system: Alert and oriented Extremities: No edema, no clubbing ,no cyanosis Skin: No rashes, no ulcers,no icterus     Data  Reviewed: I have personally reviewed following labs and imaging studies  CBC: Recent Labs  Lab 11/04/22 1435 11/04/22 1520 11/06/22 0247  WBC 11.3*  --  14.4*  NEUTROABS 7.9*  --   --   HGB 13.8 13.3 12.4  HCT 44.3 39.0 39.6  MCV 84.1  --  81.8  PLT 320  --  305   Basic Metabolic Panel: Recent Labs  Lab 11/04/22 1435 11/04/22 1520 11/06/22 0247  NA 135 138 134*  K 3.7 3.4* 4.4  CL 105 105 101  CO2 23  --  24  GLUCOSE 103* 88 95  BUN 12 11 10    CREATININE 0.88 0.80 0.95  CALCIUM 8.6*  --  8.5*     No results found for this or any previous visit (from the past 240 hour(s)).   Radiology Studies: VAS [place] LOWER EXTREMITY VENOUS (DVT)  Result Date: 11/05/2022  Lower Venous DVT Study Patient Name:  Laura [name]  Date of Exam:   11/05/2022 Medical Rec #: [id number]             Accession #:    [id number] Date of Birth: [birth year]             Patient Gender: F Patient Age:   38 years Exam Location:  [facility] Procedure:      VAS [place] LOWER EXTREMITY VENOUS (DVT) Referring Phys: Leticia Penna --------------------------------------------------------------------------------  Indications: Stroke.  Risk Factors: None identified. Comparison Study: No prior study Performing Technologist: Shona Simpson  Examination Guidelines: A complete evaluation includes B-mode imaging, spectral Doppler, color Doppler, and power Doppler as needed of all accessible portions of each vessel. Bilateral testing is considered an integral part of a complete examination. Limited examinations for reoccurring indications may be performed as noted. The reflux portion of the exam is performed with the patient in reverse Trendelenburg.  +---------+---------------+---------+-----------+----------+--------------+ RIGHT    CompressibilityPhasicitySpontaneityPropertiesThrombus Aging +---------+---------------+---------+-----------+----------+--------------+ CFV      Full           Yes      Yes                                 +---------+---------------+---------+-----------+----------+--------------+ SFJ      Full                                                        +---------+---------------+---------+-----------+----------+--------------+ FV Prox  Full                                                        +---------+---------------+---------+-----------+----------+--------------+ FV Mid   Full                                                         +---------+---------------+---------+-----------+----------+--------------+ FV DistalFull                                                        +---------+---------------+---------+-----------+----------+--------------+  PFV      Full                                                        +---------+---------------+---------+-----------+----------+--------------+ POP      Full           Yes      Yes                                 +---------+---------------+---------+-----------+----------+--------------+ PTV      Full                                                        +---------+---------------+---------+-----------+----------+--------------+ PERO     Full                                                        +---------+---------------+---------+-----------+----------+--------------+   +---------+---------------+---------+-----------+----------+--------------+ LEFT     CompressibilityPhasicitySpontaneityPropertiesThrombus Aging +---------+---------------+---------+-----------+----------+--------------+ CFV      Full           Yes      Yes                                 +---------+---------------+---------+-----------+----------+--------------+ SFJ      Full                                                        +---------+---------------+---------+-----------+----------+--------------+ FV Prox  Full                                                        +---------+---------------+---------+-----------+----------+--------------+ FV Mid   Full                                                        +---------+---------------+---------+-----------+----------+--------------+ FV DistalFull                                                        +---------+---------------+---------+-----------+----------+--------------+ PFV      Full                                                         +---------+---------------+---------+-----------+----------+--------------+  POP      Full           Yes      Yes                                 +---------+---------------+---------+-----------+----------+--------------+ PTV      Full                                                        +---------+---------------+---------+-----------+----------+--------------+ PERO     Full                                                        +---------+---------------+---------+-----------+----------+--------------+     Summary: BILATERAL: - No evidence of deep vein thrombosis seen in the lower extremities, bilaterally. -No evidence of popliteal cyst, bilaterally.   *See table(s) above for measurements and observations. Electronically signed by Heath Lark on 11/05/2022 at 7:43:41 PM.    Final    ECHOCARDIOGRAM COMPLETE BUBBLE STUDY  Result Date: 11/05/2022    ECHOCARDIOGRAM REPORT   Patient Name:   JAYAH BALTHAZAR Date of Exam: 11/05/2022 Medical Rec #:  [id number]            Height:       66.0 in Accession #:    [id number]           Weight:       250.0 lb Date of Birth:  [birth year]            BSA:          2.199 m Patient Age:    37 years             BP:           127/76 mmHg Patient Gender: F                    HR:           67 bpm. Exam Location:  Inpatient Procedure: 2D Echo, Cardiac Doppler, Color Doppler and Saline Contrast Bubble            Study Indications:   Stroke 434.91 / I63.9  History:       Patient has no prior history of Echocardiogram examinations.                Stroke.  Sonographer:   Lucendia Herrlich Referring      [id number] [name] Phys: IMPRESSIONS  1. Left ventricular ejection fraction, by estimation, is 60 to 65%. The left ventricle has normal function. The left ventricle has no regional wall motion abnormalities. Left ventricular diastolic parameters were normal.  2. Right ventricular systolic function is normal. The right ventricular size is normal. There is normal  pulmonary artery systolic pressure.  3. The mitral valve is normal in structure. Trivial mitral valve regurgitation. No evidence of mitral stenosis.  4. The aortic valve is normal in structure. Aortic valve regurgitation is not visualized. No aortic stenosis is present.  5. The inferior vena cava is normal in size with greater than 50% respiratory variability, suggesting right atrial  pressure of 3 mmHg.  6. Agitated saline contrast bubble study was positive with shunting observed within 3-6 cardiac cycles suggestive of interatrial shunt. Comparison(s): No prior Echocardiogram. Conclusion(s)/Recommendation(s): Positive bubble study (seen within 3 beats). Consider TEE for further evaluation if clinically indicated. FINDINGS  Left Ventricle: Left ventricular ejection fraction, by estimation, is 60 to 65%. The left ventricle has normal function. The left ventricle has no regional wall motion abnormalities. The left ventricular internal cavity size was normal in size. There is  no left ventricular hypertrophy. Left ventricular diastolic parameters were normal. Right Ventricle: The right ventricular size is normal. No increase in right ventricular wall thickness. Right ventricular systolic function is normal. There is normal pulmonary artery systolic pressure. The tricuspid regurgitant velocity is 1.25 m/s, and  with an assumed right atrial pressure of 3 mmHg, the estimated right ventricular systolic pressure is 9.2 mmHg. Left Atrium: Left atrial size was normal in size. Right Atrium: Right atrial size was normal in size. Pericardium: There is no evidence of pericardial effusion. Mitral Valve: The mitral valve is normal in structure. Trivial mitral valve regurgitation. No evidence of mitral valve stenosis. Tricuspid Valve: The tricuspid valve is normal in structure. Tricuspid valve regurgitation is trivial. No evidence of tricuspid stenosis. Aortic Valve: The aortic valve is normal in structure. Aortic valve regurgitation  is not visualized. No aortic stenosis is present. Aortic valve peak gradient measures 7.8 mmHg. Pulmonic Valve: The pulmonic valve was not well visualized. Pulmonic valve regurgitation is trivial. No evidence of pulmonic stenosis. Aorta: The aortic root, ascending aorta, aortic arch and descending aorta are all structurally normal, with no evidence of dilitation or obstruction. Venous: The inferior vena cava is normal in size with greater than 50% respiratory variability, suggesting right atrial pressure of 3 mmHg. IAS/Shunts: No atrial level shunt detected by color flow Doppler. Agitated saline contrast was given intravenously to evaluate for intracardiac shunting. Agitated saline contrast bubble study was positive with shunting observed within 3-6 cardiac cycles suggestive of interatrial shunt.  LEFT VENTRICLE PLAX 2D LVIDd:         4.60 cm   Diastology LVIDs:         3.00 cm   LV e' medial:    11.00 cm/s LV PW:         0.80 cm   LV E/e' medial:  8.0 LV IVS:        0.80 cm   LV e' lateral:   12.70 cm/s LVOT diam:     2.20 cm   LV E/e' lateral: 6.9 LV SV:         77 LV SV Index:   35 LVOT Area:     3.80 cm                           3D Volume EF:                          3D EF:        63 %                          LV EDV:       149 ml                          LV ESV:       55 ml  LV SV:        94 ml RIGHT VENTRICLE             IVC RV S prime:     13.30 cm/s  IVC diam: 1.90 cm TAPSE (M-mode): 2.4 cm LEFT ATRIUM           Index        RIGHT ATRIUM           Index LA diam:      3.50 cm 1.59 cm/m   RA Area:     12.00 cm LA Vol (A2C): 43.7 ml 19.88 ml/m  RA Volume:   21.50 ml  9.78 ml/m LA Vol (A4C): 47.9 ml 21.79 ml/m  AORTIC VALVE AV Area (Vmax): 2.58 cm AV Vmax:        140.00 cm/s AV Peak Grad:   7.8 mmHg LVOT Vmax:      95.20 cm/s LVOT Vmean:     61.800 cm/s LVOT VTI:       0.203 m  AORTA Ao Root diam: 3.00 cm Ao Asc diam:  3.00 cm MITRAL VALVE                TRICUSPID VALVE MV Area  (PHT): 3.17 cm     TR Peak grad:   6.2 mmHg MV Decel Time: 239 msec     TR Vmax:        125.00 cm/s MR Peak grad: 6.8 mmHg MR Vmax:      130.00 cm/s   SHUNTS MV E velocity: 87.50 cm/s   Systemic VTI:  0.20 m MV A velocity: 104.00 cm/s  Systemic Diam: 2.20 cm MV E/A ratio:  0.84 Jodelle Red MD Electronically signed by Jodelle Red MD Signature Date/Time: 11/05/2022/11:09:04 AM    Final    MR BRAIN W WO CONTRAST  Result Date: 11/04/2022 CLINICAL DATA:  Neuro deficit, acute, stroke suspected; Double vision. EXAM: MRI HEAD AND ORBITS WITHOUT AND WITH CONTRAST TECHNIQUE: Multiplanar, multiecho pulse sequences of the brain and surrounding structures were obtained without and with intravenous contrast. Multiplanar, multiecho pulse sequences of the orbits and surrounding structures were obtained including fat saturation techniques, before and after intravenous contrast administration. CONTRAST:  10mL GADAVIST GADOBUTROL 1 MMOL/ML IV SOLN COMPARISON:  Head CT and CTA head/neck 11/04/2022. FINDINGS: MRI HEAD FINDINGS Brain: Acute infarct in the medial aspect of the right thalamus (axial image 25 series 8, coronal image 19 series 6). Possible punctate acute infarct in the medial aspect of the left thalamus (axial image 25 series 8). No acute hemorrhage or significant mass effect. No hydrocephalus or extra-axial collection. No mass or abnormal enhancement. No foci of abnormal susceptibility. Vascular: Normal flow voids and vessel enhancement. Skull and upper cervical spine: Normal marrow signal and enhancement. Other: None. MRI ORBITS FINDINGS Orbits: No traumatic or inflammatory finding. Globes, optic nerves, orbital fat, extraocular muscles, vascular structures, and lacrimal glands are normal. Visualized sinuses: Clear. Soft tissues: Unremarkable. IMPRESSION: 1. Acute infarct in the medial aspect of the right thalamus. Possible punctate acute infarct in the medial aspect of the left thalamus. No acute  hemorrhage or significant mass effect. 2. Normal MRI of the orbits. Electronically Signed   By: Orvan Falconer M.D.   On: 11/04/2022 18:45   MR ORBITS W WO CONTRAST  Result Date: 11/04/2022 CLINICAL DATA:  Neuro deficit, acute, stroke suspected; Double vision. EXAM: MRI HEAD AND ORBITS WITHOUT AND WITH CONTRAST TECHNIQUE: Multiplanar, multiecho pulse sequences of the brain and surrounding structures were obtained  without and with intravenous contrast. Multiplanar, multiecho pulse sequences of the orbits and surrounding structures were obtained including fat saturation techniques, before and after intravenous contrast administration. CONTRAST:  10mL GADAVIST GADOBUTROL 1 MMOL/ML IV SOLN COMPARISON:  Head CT and CTA head/neck 11/04/2022. FINDINGS: MRI HEAD FINDINGS Brain: Acute infarct in the medial aspect of the right thalamus (axial image 25 series 8, coronal image 19 series 6). Possible punctate acute infarct in the medial aspect of the left thalamus (axial image 25 series 8). No acute hemorrhage or significant mass effect. No hydrocephalus or extra-axial collection. No mass or abnormal enhancement. No foci of abnormal susceptibility. Vascular: Normal flow voids and vessel enhancement. Skull and upper cervical spine: Normal marrow signal and enhancement. Other: None. MRI ORBITS FINDINGS Orbits: No traumatic or inflammatory finding. Globes, optic nerves, orbital fat, extraocular muscles, vascular structures, and lacrimal glands are normal. Visualized sinuses: Clear. Soft tissues: Unremarkable. IMPRESSION: 1. Acute infarct in the medial aspect of the right thalamus. Possible punctate acute infarct in the medial aspect of the left thalamus. No acute hemorrhage or significant mass effect. 2. Normal MRI of the orbits. Electronically Signed   By: Orvan Falconer M.D.   On: 11/04/2022 18:45   CT HEAD CODE STROKE WO CONTRAST  Result Date: 11/04/2022 CLINICAL DATA:  Code stroke. Sudden onset vision changes. Double  vision in right eye. EXAM: CT ANGIOGRAPHY HEAD AND NECK CT PERFUSION BRAIN TECHNIQUE: Multidetector CT imaging of the head and neck was performed using the standard protocol during bolus administration of intravenous contrast. Multiplanar CT image reconstructions and MIPs were obtained to evaluate the vascular anatomy. Carotid stenosis measurements (when applicable) are obtained utilizing NASCET criteria, using the distal internal carotid diameter as the denominator. Multiphase CT imaging of the brain was performed following IV bolus contrast injection. Subsequent parametric perfusion maps were calculated using RAPID software. RADIATION DOSE REDUCTION: This exam was performed according to the departmental dose-optimization program which includes automated exposure control, adjustment of the mA and/or kV according to patient size and/or use of iterative reconstruction technique. CONTRAST:  OMNIPAQUE IOHEXOL 350 MG/ML SOLN COMPARISON:  None Available. FINDINGS: CT HEAD FINDINGS Brain: There is no acute intracranial hemorrhage, extra-axial fluid collection, or acute infarct. Parenchymal volume is normal. The ventricles are normal in size. Gray-white differentiation is preserved The pituitary and suprasellar region are normal. There is no mass lesion. There is no mass effect or midline shift. Vascular: No hyperdense vessel or unexpected calcification. Skull: Normal. Negative for fracture or focal lesion. Sinuses/Orbits: The paranasal sinuses are clear. The globes and orbits are unremarkable. Other: The mastoid air cells and middle ear cavities are clear. ASPECTS [facility] Stroke Program Early CT Score) - Ganglionic level infarction (caudate, lentiform nuclei, internal capsule, insula, M1-M3 cortex): 7 - Supraganglionic infarction (M4-M6 cortex): 3 Total score (0-10 with 10 being normal): 10 CTA NECK FINDINGS Aortic arch: The imaged aortic arch is normal. The origins of the major branch vessels are patent. The  subclavian arteries are patent to the level imaged. Right carotid system: The right common, internal, and external carotid arteries are patent, without hemodynamically significant stenosis or occlusion there is no evidence of dissection or aneurysm. Left carotid system: The left common, internal, and external carotid arteries are patent, without hemodynamically significant stenosis or occlusion. There is no evidence of dissection or aneurysm. Vertebral arteries: The vertebral arteries are patent, without hemodynamically significant stenosis or occlusion there is no evidence of dissection or aneurysm. Skeleton: There is no acute osseous abnormality or  suspicious osseous lesion. There is no visible canal hematoma. Other neck: The soft tissues of the neck are unremarkable. Upper chest: The imaged lung apices are clear. Review of the MIP images confirms the above findings CTA HEAD FINDINGS Anterior circulation: The intracranial ICAs are normal. The ophthalmic arteries are identified bilaterally. The bilateral MCAs and ACAS are normal. The anterior communicating artery is normal. There is no aneurysm or AVM. Posterior circulation: The bilateral V4 segments are normal. The basilar artery is normal. The major cerebellar arteries appear patent. The bilateral PCAs are normal. Bilateral posterior communicating arteries are identified with a predominantly fetal origin on the right. There is no aneurysm or AVM. Venous sinuses: Patent. Anatomic variants: As above. Review of the MIP images confirms the above findings CT Brain Perfusion Findings: ASPECTS: 10 CBF (<30%) Volume: 0mL Perfusion (Tmax>6.0s) volume: 0mL Mismatch Volume: 0mL Infarction Location:N/a IMPRESSION: 1. Normal noncontrast head CT. 2. Normal CTA of the neck. 3. Negative CT perfusion. The results of the initial noncontrast head CT were called by telephone at the time of interpretation on 11/04/2022 at 2:51 pm to provider Dr Earlene Plater , who verbally acknowledged these  results. Electronically Signed   By: Lesia Hausen M.D.   On: 11/04/2022 15:08   CT ANGIO HEAD NECK W WO CM W PERF (CODE STROKE)  Result Date: 11/04/2022 CLINICAL DATA:  Code stroke. Sudden onset vision changes. Double vision in right eye. EXAM: CT ANGIOGRAPHY HEAD AND NECK CT PERFUSION BRAIN TECHNIQUE: Multidetector CT imaging of the head and neck was performed using the standard protocol during bolus administration of intravenous contrast. Multiplanar CT image reconstructions and MIPs were obtained to evaluate the vascular anatomy. Carotid stenosis measurements (when applicable) are obtained utilizing NASCET criteria, using the distal internal carotid diameter as the denominator. Multiphase CT imaging of the brain was performed following IV bolus contrast injection. Subsequent parametric perfusion maps were calculated using RAPID software. RADIATION DOSE REDUCTION: This exam was performed according to the departmental dose-optimization program which includes automated exposure control, adjustment of the mA and/or kV according to patient size and/or use of iterative reconstruction technique. CONTRAST:  OMNIPAQUE IOHEXOL 350 MG/ML SOLN COMPARISON:  None Available. FINDINGS: CT HEAD FINDINGS Brain: There is no acute intracranial hemorrhage, extra-axial fluid collection, or acute infarct. Parenchymal volume is normal. The ventricles are normal in size. Gray-white differentiation is preserved The pituitary and suprasellar region are normal. There is no mass lesion. There is no mass effect or midline shift. Vascular: No hyperdense vessel or unexpected calcification. Skull: Normal. Negative for fracture or focal lesion. Sinuses/Orbits: The paranasal sinuses are clear. The globes and orbits are unremarkable. Other: The mastoid air cells and middle ear cavities are clear. ASPECTS [facility] Stroke Program Early CT Score) - Ganglionic level infarction (caudate, lentiform nuclei, internal capsule, insula, M1-M3  cortex): 7 - Supraganglionic infarction (M4-M6 cortex): 3 Total score (0-10 with 10 being normal): 10 CTA NECK FINDINGS Aortic arch: The imaged aortic arch is normal. The origins of the major branch vessels are patent. The subclavian arteries are patent to the level imaged. Right carotid system: The right common, internal, and external carotid arteries are patent, without hemodynamically significant stenosis or occlusion there is no evidence of dissection or aneurysm. Left carotid system: The left common, internal, and external carotid arteries are patent, without hemodynamically significant stenosis or occlusion. There is no evidence of dissection or aneurysm. Vertebral arteries: The vertebral arteries are patent, without hemodynamically significant stenosis or occlusion there is no evidence of dissection  or aneurysm. Skeleton: There is no acute osseous abnormality or suspicious osseous lesion. There is no visible canal hematoma. Other neck: The soft tissues of the neck are unremarkable. Upper chest: The imaged lung apices are clear. Review of the MIP images confirms the above findings CTA HEAD FINDINGS Anterior circulation: The intracranial ICAs are normal. The ophthalmic arteries are identified bilaterally. The bilateral MCAs and ACAS are normal. The anterior communicating artery is normal. There is no aneurysm or AVM. Posterior circulation: The bilateral V4 segments are normal. The basilar artery is normal. The major cerebellar arteries appear patent. The bilateral PCAs are normal. Bilateral posterior communicating arteries are identified with a predominantly fetal origin on the right. There is no aneurysm or AVM. Venous sinuses: Patent. Anatomic variants: As above. Review of the MIP images confirms the above findings CT Brain Perfusion Findings: ASPECTS: 10 CBF (<30%) Volume: 0mL Perfusion (Tmax>6.0s) volume: 0mL Mismatch Volume: 0mL Infarction Location:N/a IMPRESSION: 1. Normal noncontrast head CT. 2. Normal  CTA of the neck. 3. Negative CT perfusion. The results of the initial noncontrast head CT were called by telephone at the time of interpretation on 11/04/2022 at 2:51 pm to provider Dr Earlene Plater , who verbally acknowledged these results. Electronically Signed   By: Lesia Hausen M.D.   On: 11/04/2022 15:08    Scheduled Meds:  aspirin  81 mg Oral Daily   Or   aspirin  300 mg Rectal Daily   atorvastatin  40 mg Oral Daily   clopidogrel  75 mg Oral Daily   escitalopram  20 mg Oral QHS   hydroxychloroquine  200 mg Oral BID   Continuous Infusions:   LOS: 1 day   Burnadette Pop, MD [facility] P7/25/2024, 1:32 PM

## 2022-11-07 ENCOUNTER — Inpatient Hospital Stay (HOSPITAL_COMMUNITY): Payer: Managed Care, Other (non HMO)

## 2022-11-07 ENCOUNTER — Encounter (HOSPITAL_COMMUNITY): Payer: Self-pay | Admitting: Family Medicine

## 2022-11-07 ENCOUNTER — Other Ambulatory Visit (HOSPITAL_COMMUNITY): Payer: Self-pay

## 2022-11-07 ENCOUNTER — Encounter (HOSPITAL_COMMUNITY)
Admission: EM | Disposition: A | Discharge status: Home / Self Care | Payer: Self-pay | Source: Home / Self Care | Attending: Internal Medicine

## 2022-11-07 ENCOUNTER — Inpatient Hospital Stay (HOSPITAL_COMMUNITY): Payer: Managed Care, Other (non HMO) | Admitting: Anesthesiology

## 2022-11-07 ENCOUNTER — Other Ambulatory Visit: Payer: Self-pay | Admitting: Cardiology

## 2022-11-07 ENCOUNTER — Encounter: Payer: Self-pay | Admitting: Hematology and Oncology

## 2022-11-07 DIAGNOSIS — I639 Cerebral infarction, unspecified: Secondary | ICD-10-CM

## 2022-11-07 DIAGNOSIS — Q2112 Patent foramen ovale: Secondary | ICD-10-CM | POA: Diagnosis not present

## 2022-11-07 DIAGNOSIS — Z6841 Body Mass Index (BMI) 40.0 and over, adult: Secondary | ICD-10-CM

## 2022-11-07 DIAGNOSIS — E782 Mixed hyperlipidemia: Secondary | ICD-10-CM

## 2022-11-07 HISTORY — PX: TEE WITHOUT CARDIOVERSION: SHX5443

## 2022-11-07 LAB — ECHO TEE

## 2022-11-07 LAB — CBC: RBC: 5.08 MIL/uL (ref 3.87–5.11)

## 2022-11-07 SURGERY — ECHOCARDIOGRAM, TRANSESOPHAGEAL
Anesthesia: Monitor Anesthesia Care

## 2022-11-07 MED ORDER — PROPOFOL 10 MG/ML IV BOLUS
INTRAVENOUS | Status: DC | PRN
  Administered 2022-11-07: 80 mg via INTRAVENOUS

## 2022-11-07 MED ORDER — GLYCOPYRROLATE 0.2 MG/ML IJ SOLN
INTRAMUSCULAR | Status: DC | PRN
  Administered 2022-11-07: .1 mg via INTRAVENOUS

## 2022-11-07 MED ORDER — ASPIRIN 81 MG PO CHEW
81.0000 mg | CHEWABLE_TABLET | Freq: Every day | ORAL | 1 refills | Status: DC
  Filled 2022-11-07: qty 60, 60d supply, fill #0

## 2022-11-07 MED ORDER — SODIUM CHLORIDE 0.9 % IV SOLN: INTRAVENOUS | Status: DC

## 2022-11-07 MED ORDER — ATORVASTATIN CALCIUM 40 MG PO TABS
40.0000 mg | ORAL_TABLET | Freq: Every day | ORAL | 1 refills | Status: DC
  Filled 2022-11-07: qty 30, 30d supply, fill #0
  Filled 2022-12-08: qty 30, 30d supply, fill #1

## 2022-11-07 MED ORDER — CLOPIDOGREL BISULFATE 75 MG PO TABS
75.0000 mg | ORAL_TABLET | Freq: Every day | ORAL | 0 refills | Status: DC
  Filled 2022-11-07: qty 18, 18d supply, fill #0

## 2022-11-07 MED ORDER — PROPOFOL 500 MG/50ML IV EMUL
INTRAVENOUS | Status: DC | PRN
  Administered 2022-11-07: 200 ug/kg/min via INTRAVENOUS

## 2022-11-07 MED ORDER — LIDOCAINE 2% (20 MG/ML) 5 ML SYRINGE
INTRAMUSCULAR | Status: DC | PRN
  Administered 2022-11-07: 100 mg via INTRAVENOUS

## 2022-11-07 NOTE — TOC Transition Note (Signed)
Transition of Care [facility]) - CM/SW Discharge Note   Patient Details  Name: Laura [name] MRN: [id number] Date of Birth: 19-Mar-1986  [facility] CM/SW Contact:  Kermit Balo, RN Phone Number: 11/07/2022, 1:44 PM   Clinical Narrative:    Pt discharging home with self care. No f/u per PT/OT. Pt has transportation home.   Final next level of care: Home/Self Care Barriers to Discharge: No Barriers Identified   Patient Goals and CMS Choice      Discharge Placement                         Discharge Plan and Services Additional resources added to the After Visit Summary for                                       Social Determinants of Health (SDOH) Interventions SDOH Screenings   Food Insecurity: No Food Insecurity (11/04/2022)  Housing: Low Risk  (11/04/2022)  Transportation Needs: No Transportation Needs (11/04/2022)  Utilities: Not At Risk (11/04/2022)  Alcohol Screen: Low Risk  (07/30/2020)  Depression (PHQ2-9): Low Risk  (07/22/2022)  Tobacco Use: Low Risk  (11/07/2022)     Readmission Risk Interventions     No data to display

## 2022-11-07 NOTE — Anesthesia Preprocedure Evaluation (Addendum)
Anesthesia Evaluation  Patient identified by MRN, date of birth, ID band Patient awake    Reviewed: Allergy & Precautions, NPO status , Patient's Chart, lab work & pertinent test results  Airway Mallampati: III  TM Distance: >3 FB Neck ROM: Full    Dental  (+) Teeth Intact, Dental Advisory Given   Pulmonary neg pulmonary ROS Snores at night, has never had sleep study    Pulmonary exam normal breath sounds clear to auscultation       Cardiovascular negative cardio ROS Normal cardiovascular exam Rhythm:Regular Rate:Normal  TTE 11/05/22  1. Left ventricular ejection fraction, by estimation, is 60 to 65%. The  left ventricle has normal function. The left ventricle has no regional  wall motion abnormalities. Left ventricular diastolic parameters were  normal.   2. Right ventricular systolic function is normal. The right ventricular  size is normal. There is normal pulmonary artery systolic pressure.   3. The mitral valve is normal in structure. Trivial mitral valve  regurgitation. No evidence of mitral stenosis.   4. The aortic valve is normal in structure. Aortic valve regurgitation is  not visualized. No aortic stenosis is present.   5. The inferior vena cava is normal in size with greater than 50%  respiratory variability, suggesting right atrial pressure of 3 mmHg.   6. Agitated saline contrast bubble study was positive with shunting  observed within 3-6 cardiac cycles suggestive of interatrial shunt.      Neuro/Psych  Headaches PSYCHIATRIC DISORDERS Anxiety Depression    Acute ischemic stroke/visual disturbance: Presented with double vision, difficulty with balance. Head CT, CTA head and neck did not show any acute findings.  MRI of the brain showed acute infarct in the medial aspect of the right thalamus,possible punctate acute infarct in the medial aspect of the left thalamus.   CVA (numbness R tongue, some blurry vision),  Residual Symptoms    GI/Hepatic negative GI ROS, Neg liver ROS,,,  Endo/Other    Morbid obesityBMI 40  Renal/GU negative Renal ROS  negative genitourinary   Musculoskeletal  (+) Arthritis , Rheumatoid disorders,    Abdominal  (+) + obese  Peds  Hematology negative hematology ROS (+)   Anesthesia Other Findings Wegovy LD- months ago  Reproductive/Obstetrics negative OB ROS Serum preg test neg 7/23                             Anesthesia Physical Anesthesia Plan  ASA: 3  Anesthesia Plan: MAC   Post-op Pain Management:    Induction:   PONV Risk Score and Plan: 2 and Propofol infusion and TIVA  Airway Management Planned: Natural Airway and Simple Face Mask  Additional Equipment: None  Intra-op Plan:   Post-operative Plan:   Informed Consent: I have reviewed the patients History and Physical, chart, labs and discussed the procedure including the risks, benefits and alternatives for the proposed anesthesia with the patient or authorized representative who has indicated his/her understanding and acceptance.       Plan Discussed with: CRNA  Anesthesia Plan Comments:        Anesthesia Quick Evaluation

## 2022-11-07 NOTE — Progress Notes (Signed)
  Echocardiogram Echocardiogram Transesophageal has been performed.  Laura [name] 11/07/2022, 8:36 AM

## 2022-11-07 NOTE — Interval H&P Note (Signed)
History and Physical Interval Note:  11/07/2022 7:51 AM  Laura [name]  has presented today for surgery, with the diagnosis of Stroke.  The various methods of treatment have been discussed with the patient and family. After consideration of risks, benefits and other options for treatment, the patient has consented to  Procedure(s): TRANSESOPHAGEAL ECHOCARDIOGRAM (N/A) as a surgical intervention.  The patient's history has been reviewed, patient examined, no change in status, stable for surgery.  I have reviewed the patient's chart and labs.  Questions were answered to the patient's satisfaction.     Olga Millers

## 2022-11-07 NOTE — Discharge Summary (Signed)
Physician Discharge Summary  Laura [name] JXB:[id number] DOB: 09-10-1985 DOA: 11/04/2022  PCP: Tollie Eth, NP  Admit date: 11/04/2022 Discharge date: 11/07/2022  Admitted From: Home Disposition:  Home  Discharge Condition:Stable CODE STATUS:FULL Diet recommendation: Heart Healthy  Brief/Interim Summary: Patient is a 37 year old female with history of arthritis, anxiety who presented with visual disturbance complaining of double vision which resolves when closing 1 eye.  Also reported difficulty with balance.  On presentation, she was hemodynamically stable.  Head CT, CTA head and neck did not show any acute findings.  MRI of the brain showed acute infarct in the medial aspect of the right thalamus,possible punctate acute infarct in the medial aspect of the left thalamus.  Admitted for stroke workup.  Neurology consulted.  Stroke workup completed.  Medically stable for discharge to home today.  Following problems were addressed during the hospitalization: Acute ischemic stroke/visual disturbance: Presented with double vision, difficulty with balance. Head CT, CTA head and neck did not show any acute findings.  MRI of the brain showed acute infarct in the medial aspect of the right thalamus,possible punctate acute infarct in the medial aspect of the left thalamus.  Admitted for stroke workup.  Neurology consulted and were following LDL of 139, A1c of 5.5. Echo showed EF of 60 to 60%, no wall motion abnormality, agitated saline contrast bubble study was positive suggesting intra-atrial shunt.  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  Sent hypercoagulable workup: Mostly pending PT/OT signed off. Started on aspirin, Plavix: Plan for DAPT for 3 weeks followed by aspirin alone.  Started on Lipitor. She will follow up with neurology as an outpatient   Rheumatoid arthritis: On Plaquenil    Anxiety: On Lexapro   Leukocytosis: This is most likely reactive.  Will recommend to monitor as an outpatient   Morbid obesity: BMI 40.3.  She might need to have sleep study to rule out sleep apnea as an outpatient.  Referral sent  Discharge Diagnoses:  Principal Problem:   Acute ischemic stroke [facility]) Active Problems:   Rheumatoid arthritis (HCC)   GAD (generalized anxiety disorder)   Vision disturbance    Discharge Instructions  Discharge Instructions     Ambulatory referral to Neurology   Complete by: As directed    An appointment is requested in approximately: 4 weeks   Ambulatory referral to Sleep Studies   Complete by: As directed    Diet - low sodium heart healthy   Complete by: As directed    Discharge instructions   Complete by: As directed    1)Please take prescribed medications as instructed 2)Follow up with your PCP in a week.Do a CBC test to check your WBC count 3)Follow up with neurology as an outpatient in 4 weeks.  Name and number the provider group has been attached 4)Do a sleep study as an outpatient to rule out sleep apnea 5)You would be called by cardiology for outpatient loop recorder placement   Increase activity slowly   Complete by: As directed       Allergies as of 11/07/2022       Reactions   Amoxicillin Rash   Kenalog [triamcinolone Acetonide] Rash   Rash noted after ankle injection by rheumatologist        Medication List     STOP taking these medications    azelastine 0.05 % ophthalmic solution Commonly known as: OPTIVAR   Vitamin D3 1.25 MG (50000  UT) Tabs       TAKE these medications    acetaminophen 500 MG tablet Commonly known as: TYLENOL Take 500 mg by mouth as needed for moderate pain.   aspirin 81 MG chewable tablet Chew 1 tablet (81 mg total) by mouth daily. Start taking on: November 08, 2022   atorvastatin 40 MG tablet Commonly known as: LIPITOR Take 1 tablet (40 mg total) by mouth daily. Start taking on:  November 08, 2022   clopidogrel 75 MG tablet Commonly known as: PLAVIX Take 1 tablet (75 mg total) by mouth daily for 18 days. Start taking on: November 08, 2022   escitalopram 20 MG tablet Commonly known as: LEXAPRO TAKE 1 TABLET(20 MG) BY MOUTH AT BEDTIME   Humira (2 Pen) 40 MG/0.4ML pen Generic drug: adalimumab Inject 40 mg as directed every 14 (fourteen) days.   hydroxychloroquine 200 MG tablet Commonly known as: PLAQUENIL Take 1 tablet (200 mg total) by mouth 2 (two) times daily. What changed:  how much to take when to take this   LORazepam 0.5 MG tablet Commonly known as: ATIVAN Take 1 tablet (0.5 mg total) by mouth every 8 (eight) hours as needed for anxiety. What changed: when to take this   ondansetron 8 MG tablet Commonly known as: Zofran Take 1 tablet (8 mg total) by mouth every 8 (eight) hours as needed for nausea. What changed: when to take this   Wegovy 0.5 MG/0.5ML Soaj Generic drug: Semaglutide-Weight Management Inject 0.5 mg into the skin once a week.        Follow-up Information     [facility]. Schedule an appointment as soon as possible for a visit in 4 week(s).   Specialty: Neurology Contact information: [address] [place] [address] [phone]               Allergies  Allergen Reactions   Amoxicillin Rash   Kenalog [Triamcinolone Acetonide] Rash    Rash noted after ankle injection by rheumatologist    Consultations: Neurology   Procedures/Studies: ECHO TEE  Result Date: 11/07/2022    TRANSESOPHOGEAL ECHO REPORT   Patient Name:   Laura [name] Date of Exam: 11/07/2022 Medical Rec #:  [id number]            Height:       66.0 in Accession #:    [id number]           Weight:       250.0 lb Date of Birth:  19-Jul-1985            BSA:          2.199 m Patient Age:    37 years             BP:           106/75 mmHg Patient Gender: F                    HR:           73 bpm. Exam  Location:  Inpatient Procedure: Transesophageal Echo, Color Doppler and Cardiac Doppler Indications:     stroke  History:         Patient has no prior history of Echocardiogram examinations.  Sonographer:     Delcie Roch RDCS Referring Phys:  [id number] HAO MENG Diagnosing Phys: Olga Millers MD PROCEDURE: After discussion of the risks and benefits of a TEE, an informed consent was obtained from the  patient. The transesophogeal probe was passed without difficulty through the esophogus of the patient. Imaged were obtained with the patient in a left lateral decubitus position. Sedation performed by different physician. The patient was monitored while under deep sedation. Anesthestetic sedation was provided intravenously by Anesthesiology: 350mg  of Propofol, 100mg  of Lidocaine. The patient developed no complications during the procedure.  IMPRESSIONS  1. Left ventricular ejection fraction, by estimation, is 55 to 60%. The left ventricle has normal function. The left ventricle has no regional wall motion abnormalities.  2. Right ventricular systolic function is normal. The right ventricular size is normal.  3. No left atrial/left atrial appendage thrombus was detected.  4. The mitral valve is normal in structure. Trivial mitral valve regurgitation.  5. The aortic valve is tricuspid. Aortic valve regurgitation is not visualized.  6. Evidence of atrial level shunting detected by color flow Doppler. Agitated saline contrast bubble study was positive with shunting observed within 3-6 cardiac cycles suggestive of interatrial shunt. There is a small patent foramen ovale. FINDINGS  Left Ventricle: Left ventricular ejection fraction, by estimation, is 55 to 60%. The left ventricle has normal function. The left ventricle has no regional wall motion abnormalities. The left ventricular internal cavity size was normal in size. Right Ventricle: The right ventricular size is normal. Right vetricular wall thickness was not  assessed. Right ventricular systolic function is normal. Left Atrium: Left atrial size was normal in size. No left atrial/left atrial appendage thrombus was detected. Right Atrium: Right atrial size was normal in size. Pericardium: There is no evidence of pericardial effusion. Mitral Valve: The mitral valve is normal in structure. Trivial mitral valve regurgitation. Tricuspid Valve: The tricuspid valve is normal in structure. Tricuspid valve regurgitation is trivial. Aortic Valve: The aortic valve is tricuspid. Aortic valve regurgitation is not visualized. Pulmonic Valve: The pulmonic valve was normal in structure. Pulmonic valve regurgitation is trivial. Aorta: The aortic root is normal in size and structure. There is minimal (Grade I) plaque involving the descending aorta. IAS/Shunts: The interatrial septum is aneurysmal. Evidence of atrial level shunting detected by color flow Doppler. Agitated saline contrast was given intravenously to evaluate for intracardiac shunting. Agitated saline contrast bubble study was positive  with shunting observed within 3-6 cardiac cycles suggestive of interatrial shunt. A small patent foramen ovale is detected. Olga Millers MD Electronically signed by Olga Millers MD Signature Date/Time: 11/07/2022/12:24:41 PM    Final    EP STUDY  Result Date: 11/07/2022 See surgical note for result.  VAS [place] LOWER EXTREMITY VENOUS (DVT)  Result Date: 11/05/2022  Lower Venous DVT Study Patient Name:  Laura [name]  Date of Exam:   11/05/2022 Medical Rec #: [id number]             Accession #:    [id number] Date of Birth: Dec 06, 1985             Patient Gender: F Patient Age:   45 years Exam Location:  [facility] Procedure:      VAS [place] LOWER EXTREMITY VENOUS (DVT) Referring Phys: Leticia Penna --------------------------------------------------------------------------------  Indications: Stroke.  Risk Factors: None identified. Comparison Study: No prior study Performing  Technologist: Shona Simpson  Examination Guidelines: A complete evaluation includes B-mode imaging, spectral Doppler, color Doppler, and power Doppler as needed of all accessible portions of each vessel. Bilateral testing is considered an integral part of a complete examination. Limited examinations for reoccurring indications may be performed as noted. The reflux portion of the exam is performed  with the patient in reverse Trendelenburg.  +---------+---------------+---------+-----------+----------+--------------+ RIGHT    CompressibilityPhasicitySpontaneityPropertiesThrombus Aging +---------+---------------+---------+-----------+----------+--------------+ CFV      Full           Yes      Yes                                 +---------+---------------+---------+-----------+----------+--------------+ SFJ      Full                                                        +---------+---------------+---------+-----------+----------+--------------+ FV Prox  Full                                                        +---------+---------------+---------+-----------+----------+--------------+ FV Mid   Full                                                        +---------+---------------+---------+-----------+----------+--------------+ FV DistalFull                                                        +---------+---------------+---------+-----------+----------+--------------+ PFV      Full                                                        +---------+---------------+---------+-----------+----------+--------------+ POP      Full           Yes      Yes                                 +---------+---------------+---------+-----------+----------+--------------+ PTV      Full                                                        +---------+---------------+---------+-----------+----------+--------------+ PERO     Full                                                         +---------+---------------+---------+-----------+----------+--------------+   +---------+---------------+---------+-----------+----------+--------------+ LEFT     CompressibilityPhasicitySpontaneityPropertiesThrombus Aging +---------+---------------+---------+-----------+----------+--------------+ CFV      Full           Yes      Yes                                 +---------+---------------+---------+-----------+----------+--------------+  SFJ      Full                                                        +---------+---------------+---------+-----------+----------+--------------+ FV Prox  Full                                                        +---------+---------------+---------+-----------+----------+--------------+ FV Mid   Full                                                        +---------+---------------+---------+-----------+----------+--------------+ FV DistalFull                                                        +---------+---------------+---------+-----------+----------+--------------+ PFV      Full                                                        +---------+---------------+---------+-----------+----------+--------------+ POP      Full           Yes      Yes                                 +---------+---------------+---------+-----------+----------+--------------+ PTV      Full                                                        +---------+---------------+---------+-----------+----------+--------------+ PERO     Full                                                        +---------+---------------+---------+-----------+----------+--------------+     Summary: BILATERAL: - No evidence of deep vein thrombosis seen in the lower extremities, bilaterally. -No evidence of popliteal cyst, bilaterally.   *See table(s) above for measurements and observations. Electronically signed by Heath Lark on 11/05/2022  at 7:43:41 PM.    Final    ECHOCARDIOGRAM COMPLETE BUBBLE STUDY  Result Date: 11/05/2022    ECHOCARDIOGRAM REPORT   Patient Name:   Laura [name] Date of Exam: 11/05/2022 Medical Rec #:  [id number]            Height:       66.0 in Accession #:    [id number]  Weight:       250.0 lb Date of Birth:  12/14/1985            BSA:          2.199 m Patient Age:    37 years             BP:           127/76 mmHg Patient Gender: F                    HR:           67 bpm. Exam Location:  Inpatient Procedure: 2D Echo, Cardiac Doppler, Color Doppler and Saline Contrast Bubble            Study Indications:   Stroke 434.91 / I63.9  History:       Patient has no prior history of Echocardiogram examinations.                Stroke.  Sonographer:   Lucendia Herrlich Referring      [id number] [name] Phys: IMPRESSIONS  1. Left ventricular ejection fraction, by estimation, is 60 to 65%. The left ventricle has normal function. The left ventricle has no regional wall motion abnormalities. Left ventricular diastolic parameters were normal.  2. Right ventricular systolic function is normal. The right ventricular size is normal. There is normal pulmonary artery systolic pressure.  3. The mitral valve is normal in structure. Trivial mitral valve regurgitation. No evidence of mitral stenosis.  4. The aortic valve is normal in structure. Aortic valve regurgitation is not visualized. No aortic stenosis is present.  5. The inferior vena cava is normal in size with greater than 50% respiratory variability, suggesting right atrial pressure of 3 mmHg.  6. Agitated saline contrast bubble study was positive with shunting observed within 3-6 cardiac cycles suggestive of interatrial shunt. Comparison(s): No prior Echocardiogram. Conclusion(s)/Recommendation(s): Positive bubble study (seen within 3 beats). Consider TEE for further evaluation if clinically indicated. FINDINGS  Left Ventricle: Left ventricular ejection fraction, by  estimation, is 60 to 65%. The left ventricle has normal function. The left ventricle has no regional wall motion abnormalities. The left ventricular internal cavity size was normal in size. There is  no left ventricular hypertrophy. Left ventricular diastolic parameters were normal. Right Ventricle: The right ventricular size is normal. No increase in right ventricular wall thickness. Right ventricular systolic function is normal. There is normal pulmonary artery systolic pressure. The tricuspid regurgitant velocity is 1.25 m/s, and  with an assumed right atrial pressure of 3 mmHg, the estimated right ventricular systolic pressure is 9.2 mmHg. Left Atrium: Left atrial size was normal in size. Right Atrium: Right atrial size was normal in size. Pericardium: There is no evidence of pericardial effusion. Mitral Valve: The mitral valve is normal in structure. Trivial mitral valve regurgitation. No evidence of mitral valve stenosis. Tricuspid Valve: The tricuspid valve is normal in structure. Tricuspid valve regurgitation is trivial. No evidence of tricuspid stenosis. Aortic Valve: The aortic valve is normal in structure. Aortic valve regurgitation is not visualized. No aortic stenosis is present. Aortic valve peak gradient measures 7.8 mmHg. Pulmonic Valve: The pulmonic valve was not well visualized. Pulmonic valve regurgitation is trivial. No evidence of pulmonic stenosis. Aorta: The aortic root, ascending aorta, aortic arch and descending aorta are all structurally normal, with no evidence of dilitation or obstruction. Venous: The inferior vena cava is normal in size with greater than 50% respiratory variability, suggesting right  atrial pressure of 3 mmHg. IAS/Shunts: No atrial level shunt detected by color flow Doppler. Agitated saline contrast was given intravenously to evaluate for intracardiac shunting. Agitated saline contrast bubble study was positive with shunting observed within 3-6 cardiac cycles suggestive  of interatrial shunt.  LEFT VENTRICLE PLAX 2D LVIDd:         4.60 cm   Diastology LVIDs:         3.00 cm   LV e' medial:    11.00 cm/s LV PW:         0.80 cm   LV E/e' medial:  8.0 LV IVS:        0.80 cm   LV e' lateral:   12.70 cm/s LVOT diam:     2.20 cm   LV E/e' lateral: 6.9 LV SV:         77 LV SV Index:   35 LVOT Area:     3.80 cm                           3D Volume EF:                          3D EF:        63 %                          LV EDV:       149 ml                          LV ESV:       55 ml                          LV SV:        94 ml RIGHT VENTRICLE             IVC RV S prime:     13.30 cm/s  IVC diam: 1.90 cm TAPSE (M-mode): 2.4 cm LEFT ATRIUM           Index        RIGHT ATRIUM           Index LA diam:      3.50 cm 1.59 cm/m   RA Area:     12.00 cm LA Vol (A2C): 43.7 ml 19.88 ml/m  RA Volume:   21.50 ml  9.78 ml/m LA Vol (A4C): 47.9 ml 21.79 ml/m  AORTIC VALVE AV Area (Vmax): 2.58 cm AV Vmax:        140.00 cm/s AV Peak Grad:   7.8 mmHg LVOT Vmax:      95.20 cm/s LVOT Vmean:     61.800 cm/s LVOT VTI:       0.203 m  AORTA Ao Root diam: 3.00 cm Ao Asc diam:  3.00 cm MITRAL VALVE                TRICUSPID VALVE MV Area (PHT): 3.17 cm     TR Peak grad:   6.2 mmHg MV Decel Time: 239 msec     TR Vmax:        125.00 cm/s MR Peak grad: 6.8 mmHg MR Vmax:      130.00 cm/s   SHUNTS MV E velocity: 87.50 cm/s   Systemic VTI:  0.20 m MV A velocity: 104.00 cm/s  Systemic Diam: 2.20 cm MV E/A ratio:  0.84 Jodelle Red MD Electronically signed by Jodelle Red MD Signature Date/Time: 11/05/2022/11:09:04 AM    Final    MR BRAIN W WO CONTRAST  Result Date: 11/04/2022 CLINICAL DATA:  Neuro deficit, acute, stroke suspected; Double vision. EXAM: MRI HEAD AND ORBITS WITHOUT AND WITH CONTRAST TECHNIQUE: Multiplanar, multiecho pulse sequences of the brain and surrounding structures were obtained without and with intravenous contrast. Multiplanar, multiecho pulse sequences of the orbits and  surrounding structures were obtained including fat saturation techniques, before and after intravenous contrast administration. CONTRAST:  10mL GADAVIST GADOBUTROL 1 MMOL/ML IV SOLN COMPARISON:  Head CT and CTA head/neck 11/04/2022. FINDINGS: MRI HEAD FINDINGS Brain: Acute infarct in the medial aspect of the right thalamus (axial image 25 series 8, coronal image 19 series 6). Possible punctate acute infarct in the medial aspect of the left thalamus (axial image 25 series 8). No acute hemorrhage or significant mass effect. No hydrocephalus or extra-axial collection. No mass or abnormal enhancement. No foci of abnormal susceptibility. Vascular: Normal flow voids and vessel enhancement. Skull and upper cervical spine: Normal marrow signal and enhancement. Other: None. MRI ORBITS FINDINGS Orbits: No traumatic or inflammatory finding. Globes, optic nerves, orbital fat, extraocular muscles, vascular structures, and lacrimal glands are normal. Visualized sinuses: Clear. Soft tissues: Unremarkable. IMPRESSION: 1. Acute infarct in the medial aspect of the right thalamus. Possible punctate acute infarct in the medial aspect of the left thalamus. No acute hemorrhage or significant mass effect. 2. Normal MRI of the orbits. Electronically Signed   By: Orvan Falconer M.D.   On: 11/04/2022 18:45   MR ORBITS W WO CONTRAST  Result Date: 11/04/2022 CLINICAL DATA:  Neuro deficit, acute, stroke suspected; Double vision. EXAM: MRI HEAD AND ORBITS WITHOUT AND WITH CONTRAST TECHNIQUE: Multiplanar, multiecho pulse sequences of the brain and surrounding structures were obtained without and with intravenous contrast. Multiplanar, multiecho pulse sequences of the orbits and surrounding structures were obtained including fat saturation techniques, before and after intravenous contrast administration. CONTRAST:  10mL GADAVIST GADOBUTROL 1 MMOL/ML IV SOLN COMPARISON:  Head CT and CTA head/neck 11/04/2022. FINDINGS: MRI HEAD FINDINGS Brain:  Acute infarct in the medial aspect of the right thalamus (axial image 25 series 8, coronal image 19 series 6). Possible punctate acute infarct in the medial aspect of the left thalamus (axial image 25 series 8). No acute hemorrhage or significant mass effect. No hydrocephalus or extra-axial collection. No mass or abnormal enhancement. No foci of abnormal susceptibility. Vascular: Normal flow voids and vessel enhancement. Skull and upper cervical spine: Normal marrow signal and enhancement. Other: None. MRI ORBITS FINDINGS Orbits: No traumatic or inflammatory finding. Globes, optic nerves, orbital fat, extraocular muscles, vascular structures, and lacrimal glands are normal. Visualized sinuses: Clear. Soft tissues: Unremarkable. IMPRESSION: 1. Acute infarct in the medial aspect of the right thalamus. Possible punctate acute infarct in the medial aspect of the left thalamus. No acute hemorrhage or significant mass effect. 2. Normal MRI of the orbits. Electronically Signed   By: Orvan Falconer M.D.   On: 11/04/2022 18:45   CT HEAD CODE STROKE WO CONTRAST  Result Date: 11/04/2022 CLINICAL DATA:  Code stroke. Sudden onset vision changes. Double vision in right eye. EXAM: CT ANGIOGRAPHY HEAD AND NECK CT PERFUSION BRAIN TECHNIQUE: Multidetector CT imaging of the head and neck was performed using the standard protocol during bolus administration of intravenous contrast. Multiplanar CT image reconstructions and MIPs were obtained to evaluate the vascular anatomy. Carotid stenosis  measurements (when applicable) are obtained utilizing NASCET criteria, using the distal internal carotid diameter as the denominator. Multiphase CT imaging of the brain was performed following IV bolus contrast injection. Subsequent parametric perfusion maps were calculated using RAPID software. RADIATION DOSE REDUCTION: This exam was performed according to the departmental dose-optimization program which includes automated exposure control,  adjustment of the mA and/or kV according to patient size and/or use of iterative reconstruction technique. CONTRAST:  OMNIPAQUE IOHEXOL 350 MG/ML SOLN COMPARISON:  None Available. FINDINGS: CT HEAD FINDINGS Brain: There is no acute intracranial hemorrhage, extra-axial fluid collection, or acute infarct. Parenchymal volume is normal. The ventricles are normal in size. Gray-white differentiation is preserved The pituitary and suprasellar region are normal. There is no mass lesion. There is no mass effect or midline shift. Vascular: No hyperdense vessel or unexpected calcification. Skull: Normal. Negative for fracture or focal lesion. Sinuses/Orbits: The paranasal sinuses are clear. The globes and orbits are unremarkable. Other: The mastoid air cells and middle ear cavities are clear. ASPECTS [facility] Stroke Program Early CT Score) - Ganglionic level infarction (caudate, lentiform nuclei, internal capsule, insula, M1-M3 cortex): 7 - Supraganglionic infarction (M4-M6 cortex): 3 Total score (0-10 with 10 being normal): 10 CTA NECK FINDINGS Aortic arch: The imaged aortic arch is normal. The origins of the major branch vessels are patent. The subclavian arteries are patent to the level imaged. Right carotid system: The right common, internal, and external carotid arteries are patent, without hemodynamically significant stenosis or occlusion there is no evidence of dissection or aneurysm. Left carotid system: The left common, internal, and external carotid arteries are patent, without hemodynamically significant stenosis or occlusion. There is no evidence of dissection or aneurysm. Vertebral arteries: The vertebral arteries are patent, without hemodynamically significant stenosis or occlusion there is no evidence of dissection or aneurysm. Skeleton: There is no acute osseous abnormality or suspicious osseous lesion. There is no visible canal hematoma. Other neck: The soft tissues of the neck are unremarkable. Upper  chest: The imaged lung apices are clear. Review of the MIP images confirms the above findings CTA HEAD FINDINGS Anterior circulation: The intracranial ICAs are normal. The ophthalmic arteries are identified bilaterally. The bilateral MCAs and ACAS are normal. The anterior communicating artery is normal. There is no aneurysm or AVM. Posterior circulation: The bilateral V4 segments are normal. The basilar artery is normal. The major cerebellar arteries appear patent. The bilateral PCAs are normal. Bilateral posterior communicating arteries are identified with a predominantly fetal origin on the right. There is no aneurysm or AVM. Venous sinuses: Patent. Anatomic variants: As above. Review of the MIP images confirms the above findings CT Brain Perfusion Findings: ASPECTS: 10 CBF (<30%) Volume: 0mL Perfusion (Tmax>6.0s) volume: 0mL Mismatch Volume: 0mL Infarction Location:N/a IMPRESSION: 1. Normal noncontrast head CT. 2. Normal CTA of the neck. 3. Negative CT perfusion. The results of the initial noncontrast head CT were called by telephone at the time of interpretation on 11/04/2022 at 2:51 pm to provider Dr Earlene Plater , who verbally acknowledged these results. Electronically Signed   By: Lesia Hausen M.D.   On: 11/04/2022 15:08   CT ANGIO HEAD NECK W WO CM W PERF (CODE STROKE)  Result Date: 11/04/2022 CLINICAL DATA:  Code stroke. Sudden onset vision changes. Double vision in right eye. EXAM: CT ANGIOGRAPHY HEAD AND NECK CT PERFUSION BRAIN TECHNIQUE: Multidetector CT imaging of the head and neck was performed using the standard protocol during bolus administration of intravenous contrast. Multiplanar CT image reconstructions and  MIPs were obtained to evaluate the vascular anatomy. Carotid stenosis measurements (when applicable) are obtained utilizing NASCET criteria, using the distal internal carotid diameter as the denominator. Multiphase CT imaging of the brain was performed following IV bolus contrast injection.  Subsequent parametric perfusion maps were calculated using RAPID software. RADIATION DOSE REDUCTION: This exam was performed according to the departmental dose-optimization program which includes automated exposure control, adjustment of the mA and/or kV according to patient size and/or use of iterative reconstruction technique. CONTRAST:  OMNIPAQUE IOHEXOL 350 MG/ML SOLN COMPARISON:  None Available. FINDINGS: CT HEAD FINDINGS Brain: There is no acute intracranial hemorrhage, extra-axial fluid collection, or acute infarct. Parenchymal volume is normal. The ventricles are normal in size. Gray-white differentiation is preserved The pituitary and suprasellar region are normal. There is no mass lesion. There is no mass effect or midline shift. Vascular: No hyperdense vessel or unexpected calcification. Skull: Normal. Negative for fracture or focal lesion. Sinuses/Orbits: The paranasal sinuses are clear. The globes and orbits are unremarkable. Other: The mastoid air cells and middle ear cavities are clear. ASPECTS [facility] Stroke Program Early CT Score) - Ganglionic level infarction (caudate, lentiform nuclei, internal capsule, insula, M1-M3 cortex): 7 - Supraganglionic infarction (M4-M6 cortex): 3 Total score (0-10 with 10 being normal): 10 CTA NECK FINDINGS Aortic arch: The imaged aortic arch is normal. The origins of the major branch vessels are patent. The subclavian arteries are patent to the level imaged. Right carotid system: The right common, internal, and external carotid arteries are patent, without hemodynamically significant stenosis or occlusion there is no evidence of dissection or aneurysm. Left carotid system: The left common, internal, and external carotid arteries are patent, without hemodynamically significant stenosis or occlusion. There is no evidence of dissection or aneurysm. Vertebral arteries: The vertebral arteries are patent, without hemodynamically significant stenosis or occlusion there  is no evidence of dissection or aneurysm. Skeleton: There is no acute osseous abnormality or suspicious osseous lesion. There is no visible canal hematoma. Other neck: The soft tissues of the neck are unremarkable. Upper chest: The imaged lung apices are clear. Review of the MIP images confirms the above findings CTA HEAD FINDINGS Anterior circulation: The intracranial ICAs are normal. The ophthalmic arteries are identified bilaterally. The bilateral MCAs and ACAS are normal. The anterior communicating artery is normal. There is no aneurysm or AVM. Posterior circulation: The bilateral V4 segments are normal. The basilar artery is normal. The major cerebellar arteries appear patent. The bilateral PCAs are normal. Bilateral posterior communicating arteries are identified with a predominantly fetal origin on the right. There is no aneurysm or AVM. Venous sinuses: Patent. Anatomic variants: As above. Review of the MIP images confirms the above findings CT Brain Perfusion Findings: ASPECTS: 10 CBF (<30%) Volume: 0mL Perfusion (Tmax>6.0s) volume: 0mL Mismatch Volume: 0mL Infarction Location:N/a IMPRESSION: 1. Normal noncontrast head CT. 2. Normal CTA of the neck. 3. Negative CT perfusion. The results of the initial noncontrast head CT were called by telephone at the time of interpretation on 11/04/2022 at 2:51 pm to provider Dr Earlene Plater , who verbally acknowledged these results. Electronically Signed   By: Lesia Hausen M.D.   On: 11/04/2022 15:08      Subjective: Patient seen and examined at bedside today.  Comfortable.  Denies any blurry vision or focal neurological deficits.  Medically stable for discharge.  Discharge Exam: Vitals:   11/07/22 1003 11/07/22 1149  BP: 130/87 (P) 118/69  Pulse: (!) 59 (P) 74  Resp: 14 (P) 14  Temp: 97.9 F (36.6 C) (P) 97.9 F (36.6 C)  SpO2: (P) 99% (P) 96%   Vitals:   11/07/22 0850 11/07/22 0859 11/07/22 1003 11/07/22 1149  BP: 111/76 105/61 130/87 (P) 118/69  Pulse: 72  69 (!) 59 (P) 74  Resp: 13 (!) 22 14 (P) 14  Temp:   97.9 F (36.6 C) (P) 97.9 F (36.6 C)  TempSrc:   Oral (P) Oral  SpO2: 95% 94% (P) 99% (P) 96%  Weight:      Height:        General: Pt is alert, awake, not in acute distress, morbidly obese Cardiovascular: RRR, S1/S2 +, no rubs, no gallops Respiratory: CTA bilaterally, no wheezing, no rhonchi Abdominal: Soft, NT, ND, bowel sounds + Extremities: no edema, no cyanosis    The results of significant diagnostics from this hospitalization (including imaging, microbiology, ancillary and laboratory) are listed below for reference.     Microbiology: No results found for this or any previous visit (from the past 240 hour(s)).   Labs: BNP (last 3 results) No results for input(s): "BNP" in the last 8760 hours. Basic Metabolic Panel: Recent Labs  Lab 11/04/22 1435 11/04/22 1520 11/06/22 0247  NA 135 138 134*  K 3.7 3.4* 4.4  CL 105 105 101  CO2 23  --  24  GLUCOSE 103* 88 95  BUN 12 11 10   CREATININE 0.88 0.80 0.95  CALCIUM 8.6*  --  8.5*   Liver Function Tests: Recent Labs  Lab 11/04/22 1435  AST 18  ALT 22  ALKPHOS 79  BILITOT 0.6  PROT 7.6  ALBUMIN 3.9   No results for input(s): "LIPASE", "AMYLASE" in the last 168 hours. No results for input(s): "AMMONIA" in the last 168 hours. CBC: Recent Labs  Lab 11/04/22 1435 11/04/22 1520 11/06/22 0247 11/07/22 0154  WBC 11.3*  --  14.4* 14.2*  NEUTROABS 7.9*  --   --   --   HGB 13.8 13.3 12.4 13.3  HCT 44.3 39.0 39.6 42.1  MCV 84.1  --  81.8 82.9  PLT 320  --  305 294   Cardiac Enzymes: No results for input(s): "CKTOTAL", "CKMB", "CKMBINDEX", "TROPONINI" in the last 168 hours. BNP: Invalid input(s): "POCBNP" CBG: Recent Labs  Lab 11/04/22 1427  GLUCAP 108*   D-Dimer No results for input(s): "DDIMER" in the last 72 hours. Hgb A1c Recent Labs    11/05/22 0047  HGBA1C 5.5   Lipid Profile Recent Labs    11/05/22 0047  CHOL 197  HDL 31*  LDLCALC  139*  TRIG 133  CHOLHDL 6.4   Thyroid function studies No results for input(s): "TSH", "T4TOTAL", "T3FREE", "THYROIDAB" in the last 72 hours.  Invalid input(s): "FREET3" Anemia work up No results for input(s): "VITAMINB12", "FOLATE", "FERRITIN", "TIBC", "IRON", "RETICCTPCT" in the last 72 hours. Urinalysis    Component Value Date/Time   COLORURINE YELLOW 11/04/2022 1607   APPEARANCEUR CLEAR 11/04/2022 1607   LABSPEC >1.046 (H) 11/04/2022 1607   PHURINE 6.0 11/04/2022 1607   GLUCOSEU NEGATIVE 11/04/2022 1607   HGBUR MODERATE (A) 11/04/2022 1607   BILIRUBINUR NEGATIVE 11/04/2022 1607   KETONESUR NEGATIVE 11/04/2022 1607   PROTEINUR NEGATIVE 11/04/2022 1607   UROBILINOGEN 0.2 03/27/2008 0814   NITRITE NEGATIVE 11/04/2022 1607   LEUKOCYTESUR LARGE (A) 11/04/2022 1607   Sepsis Labs Recent Labs  Lab 11/04/22 1435 11/06/22 0247 11/07/22 0154  WBC 11.3* 14.4* 14.2*   Microbiology No results found for this or any previous visit (from the  past 240 hour(s)).  Please note: You were cared for by a hospitalist during your hospital stay. Once you are discharged, your primary care physician will handle any further medical issues. Please note that NO REFILLS for any discharge medications will be authorized once you are discharged, as it is imperative that you return to your primary care physician (or establish a relationship with a primary care physician if you do not have one) for your post hospital discharge needs so that they can reassess your need for medications and monitor your lab values.    Time coordinating discharge: 40 minutes  SIGNED:   Burnadette Pop, MD  [facility] 11/07/2022, 1:39 PM Pager [phone]  If 7PM-7AM, please contact night-coverage [email or web address] Password [email or web address]

## 2022-11-07 NOTE — Progress Notes (Signed)
Discharge instructions reviewed with pt and her father. Pt states she has stroke booklet and pt also provided with handouts on stroke education. Signs and symptoms (BEFAST) reviewed with pt and her father.  Copy of instructions given to pt. [facility] filling scripts, and will be ready soon. Plan to take pt and her father to discharge lounge and have pharmacy to take meds to the pt in the lounge.  Pt to be d/c'd via wheelchair with belongings.          To be escorted by staff.  Annice Needy, RN SWOT

## 2022-11-07 NOTE — Transfer of Care (Signed)
Immediate Anesthesia Transfer of Care Note  Patient: Laura [name]  Procedure(s) Performed: TRANSESOPHAGEAL ECHOCARDIOGRAM  Patient Location: PACU  Anesthesia Type:MAC  Level of Consciousness: drowsy  Airway & Oxygen Therapy: Patient Spontanous Breathing and Patient connected to nasal cannula oxygen  Post-op Assessment: Report given to RN  Post vital signs: Reviewed and stable  Last Vitals:  Vitals Value Taken Time  BP    Temp    Pulse    Resp    SpO2      Last Pain:  Vitals:   11/07/22 0711  TempSrc: Temporal  PainSc: 0-No pain         Complications: No notable events documented.

## 2022-11-07 NOTE — Progress Notes (Addendum)
STROKE TEAM PROGRESS NOTE   BRIEF HPI [name] is a 37 y.o. female with history of anemia, depression, frequent headaches, otitis media and rheumatoid arthritis who presents to the [facility] ED after acute onset of double vision.  MRI shows a stroke in the medial aspect of the right thalamus.  Rheumatoid factor elevated in 12/2018. She is currently on humera and plaquenil for her rheumatoid arthritis. She has also seen hematology for low iron levels. 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    SIGNIFICANT HOSPITAL EVENTS 7/23- Admit to [facility]  7/24 - Echo with positive bubble study 7/26 TEE performed  INTERIM HISTORY/SUBJECTIVE Patient is seen in her room with 2 family members at the bedside.  TEE has been performed today, and patient will go home with cardiac monitor and have loop recorder implanted as an outpatient.  OBJECTIVE  CBC    Component Value Date/Time   WBC 14.2 (H) 11/07/2022 0154   RBC 5.08 11/07/2022 0154   HGB 13.3 11/07/2022 0154   HGB 15.3 08/06/2021 1411   HCT 42.1 11/07/2022 0154   HCT 45.9 08/06/2021 1411   PLT 294 11/07/2022 0154   PLT 291 08/06/2021 1411   MCV 82.9 11/07/2022 0154   MCV 86 08/06/2021 1411   MCH 26.2 11/07/2022 0154   MCHC 31.6 11/07/2022 0154   RDW 12.8 11/07/2022 0154   RDW 11.6 (L) 08/06/2021 1411   LYMPHSABS 2.5 11/04/2022 1435   LYMPHSABS 2.1 08/06/2021 1411   MONOABS 0.7 11/04/2022 1435   EOSABS 0.1 11/04/2022 1435   EOSABS 0.1 08/06/2021 1411   BASOSABS 0.1 11/04/2022 1435   BASOSABS 0.1 08/06/2021 1411    BMET    Component Value Date/Time   NA 134 (L) 11/06/2022 0247   NA 144 08/06/2021 1411   K 4.4 11/06/2022 0247   CL 101 11/06/2022 0247   CO2 24 11/06/2022 0247   GLUCOSE 95 11/06/2022 0247   BUN 10 11/06/2022 0247   BUN 8 08/06/2021 1411   CREATININE 0.95 11/06/2022 0247   CREATININE 0.85 04/15/2022 1330   CREATININE 0.80  04/29/2012 1724   CALCIUM 8.5 (L) 11/06/2022 0247   EGFR 88 08/06/2021 1411   GFRNONAA >60 11/06/2022 0247   GFRNONAA >60 04/15/2022 1330    IMAGING past 24 hours EP STUDY  Result Date: 11/07/2022 See surgical note for result.   Vitals:   11/07/22 0850 11/07/22 0859 11/07/22 1003 11/07/22 1149  BP: 111/76 105/61 130/87 (P) 118/69  Pulse: 72 69 (!) 59 (P) 74  Resp: 13 (!) 22 14 (P) 14  Temp:   97.9 F (36.6 C) (P) 97.9 F (36.6 C)  TempSrc:   Oral (P) Oral  SpO2: 95% 94% (P) 99% (P) 96%  Weight:      Height:         PHYSICAL EXAM General:  Alert, well-nourished, well-developed patient in no acute distress Psych:  Mood and affect appropriate for situation CV: Regular rate and rhythm on monitor Respiratory:  Regular, unlabored respirations on room air GI: Abdomen soft and nontender   NEURO:  Mental Status: AA&Ox3, patient is able to give clear and coherent history Speech/Language: speech is without dysarthria or aphasia.    Cranial Nerves:  II: PERRL.  III, IV, VI: EOMI. Eyelids elevate symmetrically.  VII: Face is symmetrical resting and smiling VIII: hearing intact to voice. IX, X: Phonation is normal.  XII: tongue  is midline without fasciculations. Motor: 5/5 strength to all muscle groups tested.  Tone: is normal and bulk is normal Sensation- Intact to light touch bilaterally.  Coordination: FTN intact bilaterally.No drift.  Gait- deferred  NIHSS: 0   ASSESSMENT/PLAN  Acute Ischemic Infarct:  right thalamic infarct, possible punctate infarct in medial aspect of left thalamus  Etiology:  cryptogenic  Code Stroke CT head No acute abnormality.  CTA head & neck Normal CTA head and neck MRI  Acute infarct in the medial aspect of the right thalamus. Possible punctate acute infarct in the medial aspect of the left thalamus. No acute hemorrhage or significant mass effect.  Lower extremity venous duplex - negative for DVT 2D Echo with bubble study - EF 60-65%,  bubble study positive  TEE demonstrates normal left ventricular function, atrial septal aneurysm and PFO with positive saline microcavitation study Patient will require loop recorder and will have this implanted as an outpatient, 14-day cardiac monitor sent to patient. LDL 139 HgbA1c 5.5 Hypercoagulable work up pending Sed rate Prothrombin gene mutation Factor 5 Homocysteine CRP - 0.9 Cardiolipin antibodies Lupus Protein S Protein C Antithrombin III- 95 VTE prophylaxis - lovenox No antithrombotic prior to admission, now on aspirin 81 mg daily and clopidogrel 75 mg daily for 3 weeks and then ASA 81mg  alone. Therapy recommendations:  No follow up needed Disposition:  Home Blood Pressure Goal: BP less than 220/110   Hyperlipidemia LDL 139, goal < 70 Add Atorvastatin 40mg   Continue statin at discharge  Other Stroke Risk Factors Obesity, Body mass index is 40.35 kg/m., BMI >/= 30 associated with increased stroke risk, recommend weight loss, diet and exercise as appropriate  Rheumatoid Arthritis Humera and plaquenil  Other Active Problems Depression Lexapro  Hospital day # 2  Patient seen and examined by NP/APP with MD. MD to update note as needed.   Laura [name] , MSN, AGACNP-BC [facility] See Amion for schedule and pager information 11/07/2022 12:20 PM  ATTENDING ATTESTATION:  37 year old with cryptogenic right thalamic stroke.  Unclear if there is a left thalamic stroke or artifact suggesting the possibility of cardioembolic source.  Young stroke workup ongoing.  Echo and TEE shows positive bubble study.   Her exam is back to baseline with no deficits except some right tongue tingling/numbness. Out pt Sleep study and loop monitor.   Continue DAPT therapy for 3 weeks then aspirin alone.  Discussed diet and exercise to prevent future strokes. Importance of following up with stroke clinic.   Her and her family's questions were answered to their  satisfaction.   Unfortunately, she is not a candidate for sleep smart trial as her NIHSS is 1.   Dr. Viviann [name] evaluated pt independently, reviewed imaging, chart, labs. Discussed and formulated plan with the Resident/APP. Changes were made to the note where appropriate. Please see APP/resident note above for details.    Dymond Spreen,MD     To contact Stroke Continuity provider, please refer to [email or web address]. After hours, contact General Neurology

## 2022-11-07 NOTE — Anesthesia Postprocedure Evaluation (Signed)
Anesthesia Post Note  Patient: [name]  Procedure(s) Performed: TRANSESOPHAGEAL ECHOCARDIOGRAM     Patient location during evaluation: PACU Anesthesia Type: MAC Level of consciousness: awake and alert Pain management: pain level controlled Vital Signs Assessment: post-procedure vital signs reviewed and stable Respiratory status: spontaneous breathing, nonlabored ventilation and respiratory function stable Cardiovascular status: blood pressure returned to baseline and stable Postop Assessment: no apparent nausea or vomiting Anesthetic complications: no   No notable events documented.  Last Vitals:  Vitals:   11/07/22 0850 11/07/22 0859  BP: 111/76 105/61  Pulse: 72 69  Resp: 13 (!) 22  Temp:    SpO2: 95% 94%    Last Pain:  Vitals:   11/07/22 0840  TempSrc: Temporal  PainSc: 0-No pain                 Lannie Fields

## 2022-11-07 NOTE — Progress Notes (Signed)
     Transesophageal Echocardiogram Note  Laura [name] [id number] 1985-12-23  Procedure: Transesophageal Echocardiogram Indications: CVA   Procedure Details Consent: Obtained Time Out: Verified patient identification, verified procedure, site/side was marked, verified correct patient position, special equipment/implants available, Radiology Safety Procedures followed,  medications/allergies/relevent history reviewed, required imaging and test results available.  Performed  Medications:  Pt sedated by anesthesia with lidocaine 100 mg and diprovan 350 mg IV total.  Normal LV function; atrial septal aneurysm; PFO by color doppler; positive saline microcavitation study.   Complications: No apparent complications Patient did tolerate procedure well.  Olga Millers, MD

## 2022-11-10 ENCOUNTER — Telehealth: Payer: Self-pay

## 2022-11-10 NOTE — Transitions of Care (Post Inpatient/ED Visit) (Signed)
11/10/2022  Name: Laura [name] MRN: [id number] DOB: 25-Jun-1985  Today's TOC FU Call Status: Today's TOC FU Call Status:: Successful TOC FU Call Competed TOC FU Call Complete Date: 11/10/22  Transition Care Management Follow-up Telephone Call Date of Discharge: 11/07/22 Discharge Facility: [facility]) Type of Discharge: Inpatient Admission Primary Inpatient Discharge Diagnosis:: Acute ischemic stroke How have you been since you were released from the hospital?: Better Any questions or concerns?: No  Items Reviewed: Did you receive and understand the discharge instructions provided?: Yes Medications obtained,verified, and reconciled?: Yes (Medications Reviewed) Any new allergies since your discharge?: No Dietary orders reviewed?: Yes Do you have support at home?: No  Medications Reviewed Today: Medications Reviewed Today     Reviewed by Merleen Nicely, LPN (Licensed Practical Nurse) on 11/10/22 at 1201  Med List Status: <None>   Medication Order Taking? Sig Documenting Provider Last Dose Status Informant  acetaminophen (TYLENOL) 500 MG tablet [id number] Yes Take 500 mg by mouth as needed for moderate pain. [provider] Taking Active Self, Pharmacy Records  aspirin 81 MG chewable tablet [id number] Yes Chew 1 tablet (81 mg total) by mouth daily. Burnadette Pop, MD Taking Active   atorvastatin (LIPITOR) 40 MG tablet [id number] Yes Take 1 tablet (40 mg total) by mouth daily. Burnadette Pop, MD Taking Active   clopidogrel (PLAVIX) 75 MG tablet [id number] Yes Take 1 tablet (75 mg total) by mouth daily for 18 days. Burnadette Pop, MD Taking Active   escitalopram (LEXAPRO) 20 MG tablet [id number] Yes TAKE 1 TABLET(20 MG) BY MOUTH AT BEDTIME Early, Sung Amabile, NP Taking Active Self, Pharmacy Records  HUMIRA PEN 40 MG/0.4ML PNKT [id number] Yes Inject 40 mg as directed every 14 (fourteen) days. [provider] Taking Active Self, Pharmacy Records   hydroxychloroquine (PLAQUENIL) 200 MG tablet [id number] Yes Take 1 tablet (200 mg total) by mouth 2 (two) times daily.  Patient taking differently: Take 400 mg by mouth at bedtime.   Just, Azalee Course, FNP Taking Active Self, Pharmacy Records           Med Note (CRUTHIS, CHLOE C   Wed Nov 05, 2022  2:41 PM) No fill hx found for this medication. Pt is adamant she is taking it daily.   LORazepam (ATIVAN) 0.5 MG tablet [id number] Yes Take 1 tablet (0.5 mg total) by mouth every 8 (eight) hours as needed for anxiety.  Patient taking differently: Take 0.5 mg by mouth as needed for anxiety.   Tollie Eth, NP Taking Active Self, Pharmacy Records           Med Note (CRUTHIS, Marcy Siren   Wed Nov 05, 2022  2:41 PM) Pt is unsure of last dose.   ondansetron (ZOFRAN) 8 MG tablet [id number] Yes Take 1 tablet (8 mg total) by mouth every 8 (eight) hours as needed for nausea.  Patient taking differently: Take 8 mg by mouth as needed for nausea.   Tollie Eth, NP Taking Active Self, Pharmacy Records           Med Note (CRUTHIS, Marcy Siren   Wed Nov 05, 2022  2:41 PM) Pt is unsure of last dose.   Semaglutide-Weight Management (WEGOVY) 0.5 MG/0.5ML SOAJ [id number] No Inject 0.5 mg into the skin once a week.  Patient not taking: Reported on 11/05/2022   Early, Sung Amabile, NP Not Taking Active Self, Pharmacy Records            Home Care and Equipment/Supplies: Were Home  Health Services Ordered?: No Any new equipment or medical supplies ordered?: No  Functional Questionnaire: Do you need assistance with bathing/showering or dressing?: No Do you need assistance with meal preparation?: No Do you need assistance with eating?: No Do you have difficulty maintaining continence: No Do you need assistance with getting out of bed/getting out of a chair/moving?: No Do you have difficulty managing or taking your medications?: No  Follow up appointments reviewed: PCP Follow-up appointment confirmed?: Yes Date of PCP  follow-up appointment?: 11/17/22 Follow-up Provider: Enid Skeens NP Specialist Hospital Follow-up appointment confirmed?: No Follow-Up Specialty Provider:: neurology Reason Specialist Follow-Up Not Confirmed: Patient has Specialist Provider Number and will Call for Appointment Do you need transportation to your follow-up appointment?: No Do you understand care options if your condition(s) worsen?: Yes-patient verbalized understanding    SIGNATURE  Woodfin Ganja LPN [facility] Nurse Health Advisor Direct Dial [phone]

## 2022-11-13 DIAGNOSIS — R42 Dizziness and giddiness: Secondary | ICD-10-CM | POA: Diagnosis not present

## 2022-11-13 DIAGNOSIS — I639 Cerebral infarction, unspecified: Secondary | ICD-10-CM | POA: Diagnosis not present

## 2022-11-17 ENCOUNTER — Ambulatory Visit: Payer: Managed Care, Other (non HMO) | Admitting: Nurse Practitioner

## 2022-11-17 ENCOUNTER — Encounter: Payer: Self-pay | Admitting: Nurse Practitioner

## 2022-11-17 VITALS — BP 124/84 | HR 80 | Wt 246.8 lb

## 2022-11-17 DIAGNOSIS — R718 Other abnormality of red blood cells: Secondary | ICD-10-CM

## 2022-11-17 DIAGNOSIS — Z8673 Personal history of transient ischemic attack (TIA), and cerebral infarction without residual deficits: Secondary | ICD-10-CM | POA: Diagnosis not present

## 2022-11-17 DIAGNOSIS — F411 Generalized anxiety disorder: Secondary | ICD-10-CM

## 2022-11-17 LAB — COMPREHENSIVE METABOLIC PANEL
Chloride: 105 mmol/L (ref 96–106)
Potassium: 4.5 mmol/L (ref 3.5–5.2)
Sodium: 140 mmol/L (ref 134–144)

## 2022-11-17 LAB — CBC WITH DIFFERENTIAL/PLATELET

## 2022-11-17 MED ORDER — LORAZEPAM 0.5 MG PO TABS
0.5000 mg | ORAL_TABLET | Freq: Three times a day (TID) | ORAL | 3 refills | Status: AC | PRN
Start: 2022-11-17 — End: ?

## 2022-11-17 NOTE — Progress Notes (Signed)
Tollie Eth, DNP, AGNP-c [facility] [address] [place], [place] [address] [phone]   ACUTE VISIT- ESTABLISHED PATIENT  Blood pressure 124/84, pulse 80, weight 246 lb 12.8 oz (111.9 kg), last menstrual period 11/02/2022.  Subjective:  HPI Laura [name] is a 37 y.o. female presents to day for evaluation of: ischemic stroke follow-up  Rhandi was hospitalized 07/23 for an acute ischemic stroke after presenting to the ED with double vision and unsteady gait. MRI performed showing small infarct in medial aspect of the right thalamus with possible punctate acute infarct in the medial aspect of the left thalamus. 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 Coagulation labs were normal. Her WBC were slightly elevated. We will recheck these today.   She has a follow-up with cardiology on 08/22 and currently has a loop recorder in place.  She has a follow up with neurology (Dr. Pearlean Brownie) in [place] September.   Discharge instructions showed recommendation for plavix and ASA for 3 weeks then ASA alone. She mentions concerns and anxiety over stopping the plavix given that the cause of the stroke is not exactly clear.   PMH, Medications, and Allergies reviewed and updated in chart as appropriate.   ROS negative except for what is listed in HPI. Objective:  Physical Exam Vitals and nursing note reviewed.  Constitutional:      General: She is not in acute distress.    Appearance: Normal appearance. She is not ill-appearing.  HENT:     Head: Normocephalic.  Eyes:     Extraocular Movements: Extraocular movements intact.     Conjunctiva/sclera: Conjunctivae normal.     Pupils: Pupils are equal, round, and reactive to light.  Neck:     Vascular: No carotid bruit.  Cardiovascular:     Rate and Rhythm: Normal rate and regular rhythm.     Pulses: Normal pulses.     Heart sounds: Normal heart sounds.  Pulmonary:     Effort: Pulmonary effort is normal.      Breath sounds: Normal breath sounds.  Skin:    General: Skin is warm and dry.     Capillary Refill: Capillary refill takes less than 2 seconds.  Neurological:     General: No focal deficit present.     Mental Status: She is alert and oriented to person, place, and time.     Motor: No weakness.     Coordination: Coordination normal.     Gait: Gait normal.  Psychiatric:        Mood and Affect: Mood normal.        Behavior: Behavior normal.         Assessment & Plan:   Problem List Items Addressed This Visit     History of stroke in prior 3 months - Primary    Recent ischemic stroke with residual deficits noted at this time. She appears to be doing quite well, but is understandably anxious about this happening again. We discussed the TEE findings and her upcoming appointments. I am hopeful she will get additional answers and guidance with cardiology. For now, I have recommended that she remain on the plavix until her assessment with cardiology is complete. I will send in a refill of this for her. I do feel that further evaluation for risk reduction is warranted. She is on statin therapy. She would benefit from the start of GLP-1 for cardiovascular benefits, as well as reduction of weight, control of hepatic steatosis, and blood sugar management. We will plan  to discuss this at future visits. She should qualify for this medication due to the stroke history.       Relevant Medications   clopidogrel (PLAVIX) 75 MG tablet   Other Relevant Orders   CBC with Differential/Platelet (Completed)   Comprehensive metabolic panel (Completed)   CBC with Differential/Platelet   Iron, TIBC and Ferritin Panel   RBC abnormality   Relevant Orders   CBC with Differential/Platelet   Iron, TIBC and Ferritin Panel   GAD (generalized anxiety disorder)   Relevant Medications   LORazepam (ATIVAN) 0.5 MG tablet      Time: 39 minutes, >50% spent counseling, care coordination, chart review, and  documentation.    Tollie Eth, DNP, AGNP-c   History, Medications, Surgery, SDOH, and Family History reviewed and updated as appropriate.

## 2022-11-26 DIAGNOSIS — R718 Other abnormality of red blood cells: Secondary | ICD-10-CM | POA: Insufficient documentation

## 2022-11-26 DIAGNOSIS — Z8673 Personal history of transient ischemic attack (TIA), and cerebral infarction without residual deficits: Secondary | ICD-10-CM | POA: Insufficient documentation

## 2022-11-26 MED ORDER — CLOPIDOGREL BISULFATE 75 MG PO TABS: 75.0000 mg | ORAL_TABLET | Freq: Every day | ORAL | 0 refills | Status: DC

## 2022-11-26 NOTE — Assessment & Plan Note (Signed)
Recent ischemic stroke with residual deficits noted at this time. She appears to be doing quite well, but is understandably anxious about this happening again. We discussed the TEE findings and her upcoming appointments. I am hopeful she will get additional answers and guidance with cardiology. For now, I have recommended that she remain on the plavix until her assessment with cardiology is complete. I will send in a refill of this for her. I do feel that further evaluation for risk reduction is warranted. She is on statin therapy. She would benefit from the start of GLP-1 for cardiovascular benefits, as well as reduction of weight, control of hepatic steatosis, and blood sugar management. We will plan to discuss this at future visits. She should qualify for this medication due to the stroke history.

## 2022-11-26 NOTE — Assessment & Plan Note (Signed)
>>  ASSESSMENT AND PLAN FOR HISTORY OF STROKE IN PRIOR 3 MONTHS WRITTEN ON 11/26/2022 10:57 PM BY Neythan Kozlov E, NP  Recent ischemic stroke with residual deficits noted at this time. She appears to be doing quite well, but is understandably anxious about this happening again. We discussed the TEE findings and her upcoming appointments. I am hopeful she will get additional answers and guidance with cardiology. For now, I have recommended that she remain on the plavix until her assessment with cardiology is complete. I will send in a refill of this for her. I do feel that further evaluation for risk reduction is warranted. She is on statin therapy. She would benefit from the start of GLP-1 for cardiovascular benefits, as well as reduction of weight, control of hepatic steatosis, and blood sugar management. We will plan to discuss this at future visits. She should qualify for this medication due to the stroke history.

## 2022-11-27 ENCOUNTER — Other Ambulatory Visit: Payer: Self-pay | Admitting: Nurse Practitioner

## 2022-11-27 DIAGNOSIS — Z8673 Personal history of transient ischemic attack (TIA), and cerebral infarction without residual deficits: Secondary | ICD-10-CM

## 2022-12-04 ENCOUNTER — Ambulatory Visit: Payer: Managed Care, Other (non HMO) | Admitting: Cardiovascular Disease

## 2022-12-09 ENCOUNTER — Other Ambulatory Visit: Payer: Self-pay

## 2022-12-09 ENCOUNTER — Encounter: Payer: Self-pay | Admitting: Hematology and Oncology

## 2022-12-12 ENCOUNTER — Other Ambulatory Visit: Payer: Self-pay | Admitting: Nurse Practitioner

## 2022-12-18 ENCOUNTER — Ambulatory Visit: Payer: Managed Care, Other (non HMO) | Attending: Cardiovascular Disease

## 2022-12-18 ENCOUNTER — Telehealth: Payer: Self-pay | Admitting: Neurology

## 2022-12-18 ENCOUNTER — Ambulatory Visit: Payer: Managed Care, Other (non HMO) | Admitting: Neurology

## 2022-12-18 DIAGNOSIS — I639 Cerebral infarction, unspecified: Secondary | ICD-10-CM

## 2022-12-18 NOTE — Telephone Encounter (Signed)
Unable to LVM, VM box full. Sent mychart msg informing pt of need to reschedule 12/18/22 appt - MD out

## 2022-12-30 ENCOUNTER — Other Ambulatory Visit: Payer: Self-pay | Admitting: Nurse Practitioner

## 2022-12-30 ENCOUNTER — Encounter: Payer: Self-pay | Admitting: Nurse Practitioner

## 2022-12-30 ENCOUNTER — Other Ambulatory Visit: Payer: Self-pay

## 2022-12-30 DIAGNOSIS — Z8673 Personal history of transient ischemic attack (TIA), and cerebral infarction without residual deficits: Secondary | ICD-10-CM

## 2022-12-30 MED ORDER — ATORVASTATIN CALCIUM 40 MG PO TABS: 40.0000 mg | ORAL_TABLET | Freq: Every day | ORAL | 1 refills | Status: DC

## 2023-01-07 ENCOUNTER — Ambulatory Visit: Payer: Managed Care, Other (non HMO) | Admitting: Neurology

## 2023-01-07 ENCOUNTER — Encounter: Payer: Self-pay | Admitting: Neurology

## 2023-01-07 VITALS — BP 117/77 | HR 68 | Ht 66.0 in | Wt 253.0 lb

## 2023-01-07 DIAGNOSIS — Z820 Family history of epilepsy and other diseases of the nervous system: Secondary | ICD-10-CM

## 2023-01-07 DIAGNOSIS — Z9189 Other specified personal risk factors, not elsewhere classified: Secondary | ICD-10-CM | POA: Diagnosis not present

## 2023-01-07 DIAGNOSIS — R0683 Snoring: Secondary | ICD-10-CM

## 2023-01-07 DIAGNOSIS — G4719 Other hypersomnia: Secondary | ICD-10-CM

## 2023-01-07 DIAGNOSIS — Q2112 Patent foramen ovale: Secondary | ICD-10-CM

## 2023-01-07 DIAGNOSIS — R351 Nocturia: Secondary | ICD-10-CM

## 2023-01-07 DIAGNOSIS — Z8673 Personal history of transient ischemic attack (TIA), and cerebral infarction without residual deficits: Secondary | ICD-10-CM

## 2023-01-07 DIAGNOSIS — R519 Headache, unspecified: Secondary | ICD-10-CM

## 2023-01-07 NOTE — Patient Instructions (Signed)

## 2023-01-07 NOTE — Progress Notes (Signed)
Subjective:    Patient ID: Laura [name] is a 37 y.o. female.  HPI    Huston Foley, MD, PhD [facility] [address] [address] [place], [place] [address]  I saw patient, Laura [name], as a referral from the hospital for evaluation of her sleep disturbance, concern for obstructive sleep apnea.  The patient is unaccompanied today.  Ms. Gronseth is a 37 year old female with an underlying medical history of arthritis, anemia, recurrent headaches, rheumatoid arthritis, anxiety, depression, bilateral thalamic stroke in July 2024, and severe obesity with a BMI of over 40, who reports snoring and excessive daytime somnolence.  She reports that her mom has sleep apnea but currently does not use her PAP machine.  Patient feels back to baseline thankfully.  She has been found to have a PFO and is supposed to see a cardiologist.  She had not seen her rheumatologist and is currently not on her Plaquenil but takes her Humira.  She is supposed to also see hematology on a regular basis.  She lives with her mom, she has 1 dog and 1 cat in the household, the dog typically sleeps with her.  Bedtime is between midnight and 1 AM and rise time is around 7 AM.  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  Her Epworth sleepiness score is 12 out of 24, fatigue severity score is 17 out of 63.  She is currently still on Plavix and aspirin per PCP.  Her weight has been more or less stable.  She has occasional morning headaches but also admits that she has jaw pain and probably jaw clenching but no significant evidence of teeth grinding.  She has a TV on in her bedroom and typically has it on all night.  She drinks caffeine in the form of soda, about 2 bottles per day.  She is a non-smoker.  A paternal uncle had a TIA.  She drinks alcohol rarely, usually on special occasions only.  She has a history of iron deficiency and remembers that when she had  iron infusions in the past she felt a lot better with her tiredness and fatigue.  She had a tonsillectomy as a child.  She has nocturia about twice per average night.   I reviewed patient's hospital records.  She was admitted on 11/04/2022 and discharged on 11/07/2022.  She was started on dual antiplatelet therapy for 3 weeks followed by aspirin alone, she was started on Lipitor.  She is scheduled to see Dr. Pearlean Brownie next month.  Her Past Medical History Is Significant For: Past Medical History:  Diagnosis Date   Anemia    Anxiety    Anxiety 12/31/2018   Arthritis    Depression    Depression    Phreesia 02/25/2020   Depressive disorder 03/28/2014   Establishing care with new doctor, encounter for 07/30/2020   Flatulence, eructation and gas pain 07/30/2020   Frequent headaches    History of chicken pox    Otitis media    had tubes as child, then according to pt adipose tissue used to repair ear drum   Rheumatoid arthritis (HCC)    Stroke (HCC)    UTI (lower urinary tract infection)     Her Past Surgical History Is Significant For: Past Surgical History:  Procedure Laterality Date   APPENDECTOMY  2012   HYMENECTOMY  2000   TEE WITHOUT CARDIOVERSION N/A 11/07/2022   Procedure: TRANSESOPHAGEAL ECHOCARDIOGRAM;  Surgeon: Lewayne Bunting, MD;  Location: [facility] INVASIVE CV LAB;  Service: Cardiovascular;  Laterality: N/A;   TONSILECTOMY, ADENOIDECTOMY, BILATERAL MYRINGOTOMY AND TUBES      Her Family History Is Significant For: Family History  Problem Relation Age of Onset   Sleep apnea Mother    Cancer Maternal Grandmother    Cancer Maternal Grandfather    Arthritis Paternal Grandmother    Liver cancer Paternal Grandfather     Her Social History Is Significant For: Social History   Socioeconomic History   Marital status: Single    Spouse name: Not on file   Number of children: 0   Years of education: Not on file   Highest education level: Not on file  Occupational History    Occupation: Corporate treasurer: [employer]  Tobacco Use   Smoking status: Never   Smokeless tobacco: Never  Vaping Use   Vaping status: Never Used  Substance and Sexual Activity   Alcohol use: Yes    Alcohol/week: 0.0 standard drinks of alcohol    Comment: occasionally    Drug use: No   Sexual activity: Not Currently    Birth control/protection: Pill  Other Topics Concern   Not on file  Social History Narrative   Work or School: Designer, 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       Social Determinants of Health   Financial Resource Strain: Not on file  Food Insecurity: No Food Insecurity (11/04/2022)   Hunger Vital Sign    Worried About Running Out of Food in the Last Year: Never true    Ran Out of Food in the Last Year: Never true  Transportation Needs: No Transportation Needs (11/04/2022)   PRAPARE - Administrator, Civil Service (Medical): No    Lack of Transportation (Non-Medical): No  Physical Activity: Not on file  Stress: Not on file  Social Connections: Not on file    Her Allergies Are:  Allergies  Allergen Reactions   Amoxicillin Rash   Kenalog [Triamcinolone Acetonide] Rash    Rash noted after ankle injection by rheumatologist  :   Her Current Medications Are:  Outpatient Encounter Medications as of 01/07/2023  Medication Sig   acetaminophen (TYLENOL) 500 MG tablet Take 500 mg by mouth as needed for moderate pain.   aspirin 81 MG chewable tablet Chew 1 tablet (81 mg total) by mouth daily.   atorvastatin (LIPITOR) 40 MG tablet TAKE 1 TABLET(40 MG) BY MOUTH DAILY   clopidogrel (PLAVIX) 75 MG tablet TAKE 1 TABLET(75 MG) BY MOUTH DAILY   escitalopram (LEXAPRO) 20 MG tablet TAKE 1 TABLET(20 MG) BY MOUTH AT BEDTIME   HUMIRA PEN 40 MG/0.4ML PNKT Inject 40 mg as directed every 14 (fourteen) days.   hydroxychloroquine  (PLAQUENIL) 200 MG tablet Take 1 tablet (200 mg total) by mouth 2 (two) times daily. (Patient taking differently: Take 400 mg by mouth at bedtime.)   LORazepam (ATIVAN) 0.5 MG tablet Take 1 tablet (0.5 mg total) by mouth every 8 (eight) hours as needed for anxiety.   ondansetron (ZOFRAN) 8 MG tablet Take 1 tablet (8 mg total) by mouth every 8 (eight) hours as needed for nausea. (Patient taking differently: Take 8 mg by mouth as needed for nausea.)   No facility-administered encounter medications on file as of 01/07/2023.  :   Review  of Systems:  Out of a complete 14 point review of systems, all are reviewed and negative with the exception of these symptoms as listed below:  Review of Systems  Neurological:        Pt here for sleep consult Pt snores,headaches ,fatigue Pt denies sleep study,CPAP machine ,hypertension     Objective:  Neurological Exam  Physical Exam Physical Examination:   Vitals:   01/07/23 1017  BP: 117/77  Pulse: 68    General Examination: The patient is a very pleasant 37 y.o. female in no acute distress. She appears well-developed and well-nourished and well groomed.   HEENT: Normocephalic, atraumatic, pupils are equal, round and reactive to light, extraocular tracking is good without limitation to gaze excursion or nystagmus noted. Hearing is grossly intact. Face is symmetric with normal facial animation. Speech is clear with no dysarthria noted. There is no hypophonia. There is no lip, neck/head, jaw or voice tremor. Neck is supple with full range of passive and active motion. There are no carotid bruits on auscultation. Oropharynx exam reveals: mild mouth dryness, good dental hygiene and mild airway crowding, due to small airway entry, Mallampati class II, tonsils absent.  Uvula not enlarged.  Tongue protrudes centrally and palate elevates symmetrically, minimal overbite noted.  Neck circumference 15 three-quarter inches.  Chest: Clear to auscultation without  wheezing, rhonchi or crackles noted.  Heart: S1+S2+0, regular and normal without murmurs, rubs or gallops noted.   Abdomen: Soft, non-tender and non-distended.  Extremities: There is no pitting edema in the distal lower extremities bilaterally.   Skin: Warm and dry without trophic changes noted.   Musculoskeletal: exam reveals slightly wider left ankle. No obvious joint deformities.   Neurologically:  Mental status: The patient is awake, alert and oriented in all 4 spheres. Her immediate and remote memory, attention, language skills and fund of knowledge are appropriate. There is no evidence of aphasia, agnosia, apraxia or anomia. Speech is clear with normal prosody and enunciation. Thought process is linear. Mood is normal and affect is normal.  Cranial nerves II - XII are as described above under HEENT exam.  Motor exam: Normal bulk, strength and tone is noted. There is no obvious action or resting tremor.  Fine motor skills and coordination: grossly intact.  Cerebellar testing: No dysmetria or intention tremor. There is no truncal or gait ataxia.  Sensory exam: intact to light touch in the upper and lower extremities.  Gait, station and balance: She stands easily. No veering to one side is noted. No leaning to one side is noted. Posture is age-appropriate and stance is narrow based. Gait shows normal stride length and normal pace. No problems turning are noted.   Assessment and Plan:  In summary, FUMIE DEVRIES is a very pleasant 37 y.o.-year old female with an underlying medical history of arthritis, anemia, recurrent headaches, rheumatoid arthritis, anxiety, depression, bilateral thalamic stroke in July 2024, and severe obesity with a BMI of over 40, whose history and physical exam are concerning for sleep disordered breathing, particularly obstructive sleep apnea (OSA).  While a laboratory attended sleep study is typically considered "gold standard" for evaluation of sleep  disordered breathing, we mutually agreed to proceed with a home sleep test at this time.   I had a long chat with the patient about my findings and the diagnosis of sleep apnea, particularly OSA, its prognosis and treatment options. We talked about medical/conservative treatments, surgical interventions and non-pharmacological approaches for symptom control. I explained, in particular, the  risks and ramifications of untreated moderate to severe OSA, especially with respect to developing cardiovascular disease down the road, including congestive heart failure (CHF), difficult to treat hypertension, cardiac arrhythmias (particularly A-fib), neurovascular complications including TIA, stroke and dementia. Even type 2 diabetes has, in part, been linked to untreated OSA. Symptoms of untreated OSA may include (but may not be limited to) daytime sleepiness, nocturia (i.e. frequent nighttime urination), memory problems, mood irritability and suboptimally controlled or worsening mood disorder such as depression and/or anxiety, lack of energy, lack of motivation, physical discomfort, as well as recurrent headaches, especially morning or nocturnal headaches. We talked about the importance of maintaining a healthy lifestyle and striving for healthy weight. In addition, we talked about the importance of striving for and maintaining good sleep hygiene.  We also talked about the importance of secondary stroke prevention.  She is currently on aspirin and Plavix, she is awaiting to see a cardiologist for her PFO, she needs to reschedule her appointment with rheumatology and also follows with hematology.  She is scheduled to see Dr. Pearlean Brownie for stroke follow-up next month as well. I recommended a sleep study at this time. I outlined the differences between a laboratory attended sleep study which is considered more comprehensive and accurate over the option of a home sleep test (HST); the latter may lead to underestimation of sleep  disordered breathing in some instances and does not help with diagnosing upper airway resistance syndrome and is not accurate enough to diagnose primary central sleep apnea typically. I outlined possible surgical and non-surgical treatment options of OSA, including the use of a positive airway pressure (PAP) device (i.e. CPAP, AutoPAP/APAP or BiPAP in certain circumstances), a custom-made dental device (aka oral appliance, which would require a referral to a specialist dentist or orthodontist typically, and is generally speaking not considered for patients with full dentures or edentulous state), upper airway surgical options, such as traditional UPPP (which is not considered a first-line treatment) or the Inspire device (hypoglossal nerve stimulator, which would involve a referral for consultation with an ENT surgeon, after careful selection, following inclusion criteria - also not first-line treatment). I explained the PAP treatment option to the patient in detail, as this is generally considered first-line treatment.  The patient indicated that she would be willing to try PAP therapy, if the need arises. I explained the importance of being compliant with PAP treatment, not only for insurance purposes but primarily to improve patient's symptoms symptoms, and for the patient's long term health benefit, including to reduce Her cardiovascular risks longer-term.    We will pick up our discussion about the next steps and treatment options after testing.  We will keep her posted as to the test results by phone call and/or MyChart messaging where possible.  We will plan to follow-up in sleep clinic accordingly as well.  I answered all her questions today and the patient was in agreement.   I encouraged her to call with any interim questions, concerns, problems or updates or email [place] through MyChart.  Generally speaking, sleep test authorizations may take up to 2 weeks, sometimes less, sometimes longer, the patient  is encouraged to get in touch with [place] if they do not hear back from the sleep lab staff directly within the next 2 weeks.  This was an extended visit of over 60 minutes with extended chart review and counseling and coordination of care involved.   Huston Foley, MD, PhD

## 2023-01-13 ENCOUNTER — Encounter: Payer: Self-pay | Admitting: Cardiovascular Disease

## 2023-01-13 ENCOUNTER — Ambulatory Visit: Payer: Managed Care, Other (non HMO) | Attending: Cardiovascular Disease | Admitting: Cardiovascular Disease

## 2023-01-13 VITALS — BP 120/86 | HR 66 | Ht 66.0 in | Wt 253.2 lb

## 2023-01-13 DIAGNOSIS — I639 Cerebral infarction, unspecified: Secondary | ICD-10-CM | POA: Diagnosis not present

## 2023-01-13 NOTE — Progress Notes (Signed)
Electrophysiology Office Note:    Date:  01/13/2023   ID:  Laura [name], DOB Jul 17, 1985, MRN [id number]  PCP:  Tollie Eth, NP   [facility] Providers Cardiologist:  None     Referring MD: Tollie Eth, NP   History of Present Illness:    Laura [name] is a 37 y.o. female with a medical history significant for bilateral thalamic strokes in July 2024, rheumatoid arthritis, severe obesity, referred for possible loop recorder placement.     Discussed the use of AI scribe software for clinical note transcription with the patient, who gave verbal consent to proceed.  History of Present Illness          She was admitted to [facility] in July 2024 with double vision.  Brain MRI showed acute infarct in the medial aspect of the right thalamus and possible punctate acute infarcts in the medial aspect of the left thalamus.  TEE showed PFO and aneurysmal atrial septum.  Due to the presence of bihemispheric strokes and risk factors for AF, ILR has been requested to exclude AF as a cause for stroke.  She notes that she started weightlifting not long before her stroke event.  She is scheduled for sleep study due to suspected sleep apnea.     Today, she reports that she is doing well -- no complaints, no palpitations.   EKGs/Labs/Other Studies Reviewed Today:     Echocardiogram:  TEE 11/07/2022 EF 55 to 60%.  Bubble study shows a intra-atrial shunt.   Monitors:  Zio 2024 Sinus rhythm HR 50=129 bpm, avg 72 A single patient triggered event appears to be an accidental push. <1% ectopy  Stress testing:   Advanced imaging:   Cardiac catherization   EKG:   EKG Interpretation Date/Time:  Tuesday January 13 2023 09:18:45 EDT Ventricular Rate:  66 PR Interval:  138 QRS Duration:  92 QT Interval:  424 QTC Calculation: 444 R Axis:   61  Text Interpretation: Normal sinus rhythm Normal ECG When compared with ECG of 04-Nov-2022 14:25, PREVIOUS ECG IS  PRESENT Confirmed by York Pellant [phone]) on 01/13/2023 9:23:22 AM     Physical Exam:    VS:  BP 120/86 (BP Location: Left Arm, Patient Position: Sitting, Cuff Size: Large)   Pulse 66   Ht 5\' 6"  (1.676 m)   Wt 253 lb 3.2 oz (114.9 kg)   SpO2 96%   BMI 40.87 kg/m     Wt Readings from Last 3 Encounters:  01/13/23 253 lb 3.2 oz (114.9 kg)  01/07/23 253 lb (114.8 kg)  11/17/22 246 lb 12.8 oz (111.9 kg)     GEN: Well nourished, well developed in no acute distress CARDIAC: RRR, no murmurs, rubs, gallops RESPIRATORY:  Normal work of breathing MUSCULOSKELETAL: no edema    ASSESSMENT & PLAN:     Bilateral thalamic strokes Concerning for cardioembolic etiology Outpatient monitor was negative for atrial fibrillation Will proceed with ILR placement once approved by insurance If no A-fib seen within an initial monitoring period, she would like to be evaluated for PFO closure We discussed the indication and rationale for the loop placement.  I explained risks of infection and bleeding.  I explained the cost of monitoring.  Suspected sleep apnea She is currently scheduled to undergo sleep study  Morbid obesity Weight loss will be important for prevention of A-fib and maintenance of sinus rhythm      Signed, Maurice Small, MD  01/13/2023 9:39 AM  [facility]

## 2023-01-13 NOTE — Patient Instructions (Signed)
Medication Instructions:  Your physician recommends that you continue on your current medications as directed. Please refer to the Current Medication list given to you today. *If you need a refill on your cardiac medications before your next appointment, please call your pharmacy*   Testing/Procedures: Implantable Loop Recorder Your physician has recommended that you have a pacemaker inserted. A pacemaker is a small device that is placed under the skin of your chest or abdomen to help control abnormal heart rhythms. This device uses electrical pulses to prompt the heart to beat at a normal rate. Pacemakers are used to treat heart rhythms that are too slow. Wires (leads) are attached to the pacemaker that goes into the chambers of your heart. This is done in the hospital and usually requires an overnight stay. Please see the instruction sheet given to you today for more information.   Follow-Up: At [facility], you and your health needs are our priority.  As part of our continuing mission to provide you with exceptional heart care, we have created designated Provider Care Teams.  These Care Teams include your primary Cardiologist (physician) and Advanced Practice Providers (APPs -  Physician Assistants and Nurse Practitioners) who all work together to provide you with the care you need, when you need it.  We recommend signing up for the patient portal called "MyChart".  Sign up information is provided on this After Visit Summary.  MyChart is used to connect with patients for Virtual Visits (Telemedicine).  Patients are able to view lab/test results, encounter notes, upcoming appointments, etc.  Non-urgent messages can be sent to your provider as well.   To learn more about what you can do with MyChart, go to [email or web address].    Your next appointment:   We will contact you to set up an appointment for your ILR implant  Provider:   York Pellant, MD

## 2023-01-15 ENCOUNTER — Encounter: Payer: Self-pay | Admitting: Neurology

## 2023-01-15 ENCOUNTER — Telehealth: Payer: Self-pay | Admitting: Neurology

## 2023-01-15 ENCOUNTER — Ambulatory Visit (INDEPENDENT_AMBULATORY_CARE_PROVIDER_SITE_OTHER): Payer: Managed Care, Other (non HMO) | Admitting: Neurology

## 2023-01-15 VITALS — BP 118/80 | HR 73 | Ht 66.0 in | Wt 253.0 lb

## 2023-01-15 DIAGNOSIS — E7849 Other hyperlipidemia: Secondary | ICD-10-CM

## 2023-01-15 DIAGNOSIS — H532 Diplopia: Secondary | ICD-10-CM | POA: Diagnosis not present

## 2023-01-15 DIAGNOSIS — Q2112 Patent foramen ovale: Secondary | ICD-10-CM

## 2023-01-15 DIAGNOSIS — I6381 Other cerebral infarction due to occlusion or stenosis of small artery: Secondary | ICD-10-CM | POA: Diagnosis not present

## 2023-01-15 DIAGNOSIS — I639 Cerebral infarction, unspecified: Secondary | ICD-10-CM | POA: Diagnosis not present

## 2023-01-15 DIAGNOSIS — R7303 Prediabetes: Secondary | ICD-10-CM

## 2023-01-15 NOTE — Telephone Encounter (Signed)
Pt would like a call to schedule her pick up time for her home sleep test

## 2023-01-15 NOTE — Progress Notes (Signed)
[facility] [address] [place]. [place] [address] [phone]       OFFICE CONSULT NOTE  [name] Date of Birth:  Jun 18, 1985 Medical Record Number:  [id number]   Referring MD:  Burnadette Pop  Reason for Referral: Stroke  HPI: Laura [name] is a pleasant 37 year old Caucasian lady seen today for initial office consultation visit for stroke.  History is obtained from the patient and review of electronic medical records.  I personally reviewed pertinent available imaging films in PACS.  She has past medical history only of anxiety and arthralgia and anemia.  She presented on 11/04/2022 with sudden onset of binocular vertical diplopia.  She denied any accompanying headache, gait, balance or speech difficulties.  She had just gotten up in the morning and use the restroom and as she tried to get up from the toilet seat she did not notice vertical diplopia which improved when she closed her eyes.  She was seen as a code stroke at [facility], ER but felt to have too mild symptoms to be treated with thrombolysis.  CT head was unremarkable and CT angiogram showed no large vessel stenosis or occlusion.  CT perfusion was also negative for acute infarct.  MRI scan was obtained which confirmed a small right medial and possibly tiny left medial thalamic infarct.  Patient was admitted and underwent an extensive workup for cryptogenic source.  2D echo showed ejection fraction of 60 to 65%.  Left atrium size was normal.  Bubble study was positive within 3-6 cardiac cycles for right-to-left shunt.  TEE was negative for cardiac source of embolism but did confirm the right-to-left shunt.  Patient underwent 30-day outpatient cardiac monitoring which showed no evidence of paroxysmal A-fib.  Lower extremity venous Dopplers were negative for DVT lipid profile shows of showed LDL-cholesterol to be elevated at 139.  Hemoglobin A1c was 5.5.  Hypercoagulable panel lab was mostly negative  except for slight elevation of IgA beta-2 glycoprotein.  Urine drug screen was negative.  Patient was started on aspirin and Plavix as well as Lipitor.  She states she is done well.  She has had no recurrent diplopia or any other focal stroke or TIA symptoms.  She is tolerating aspirin Plavix well without bruising or bleeding.  She is tolerating Lipitor well without muscle aches.  She states she did have follow-up lipid profile on 12/13/2022 but does not have the results to share with me today.  She denies any history of deep vein thrombosis, pulmonary embolism, migraines or vision disturbances.  She denies any history of rashes however she does have pets which epileptics the patient has never had a tick bite or rash.  ROS:   14 system review of systems is positive for double vision only all other systems negative  PMH:  Past Medical History:  Diagnosis Date   Anemia    Anxiety    Anxiety 12/31/2018   Arthritis    Depression    Depression    Phreesia 02/25/2020   Depressive disorder 03/28/2014   Establishing care with new doctor, encounter for 07/30/2020   Flatulence, eructation and gas pain 07/30/2020   Frequent headaches    History of chicken pox    Otitis media    had tubes as child, then according to pt adipose tissue used to repair ear drum   Rheumatoid arthritis (HCC)    Stroke [facility])    UTI (lower urinary tract infection)     Social History:  Social History  Socioeconomic History   Marital status: Single    Spouse name: Not on file   Number of children: 0   Years of education: Not on file   Highest education level: Not on file  Occupational History   Occupation: Corporate treasurer: [employer]  Tobacco Use   Smoking status: Never   Smokeless tobacco: Never  Vaping Use   Vaping status: Never Used  Substance and Sexual Activity   Alcohol use: Yes    Alcohol/week: 0.0 standard drinks of alcohol    Comment: occasionally    Drug use: No   Sexual activity: Not  Currently    Birth control/protection: Pill  Other Topics Concern   Not on file  Social History Narrative   Work or School: Designer, 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       Social Determinants of Health   Financial Resource Strain: Not on file  Food Insecurity: No Food Insecurity (11/04/2022)   Hunger Vital Sign    Worried About Running Out of Food in the Last Year: Never true    Ran Out of Food in the Last Year: Never true  Transportation Needs: No Transportation Needs (11/04/2022)   PRAPARE - Administrator, Civil Service (Medical): No    Lack of Transportation (Non-Medical): No  Physical Activity: Not on file  Stress: Not on file  Social Connections: Not on file  Intimate Partner Violence: Not At Risk (11/04/2022)   Humiliation, Afraid, Rape, and Kick questionnaire    Fear of Current or Ex-Partner: No    Emotionally Abused: No    Physically Abused: No    Sexually Abused: No    Medications:   Current Outpatient Medications on File Prior to Visit  Medication Sig Dispense Refill   acetaminophen (TYLENOL) 500 MG tablet Take 500 mg by mouth as needed for moderate pain.     aspirin 81 MG chewable tablet Chew 1 tablet (81 mg total) by mouth daily. 60 tablet 1   atorvastatin (LIPITOR) 40 MG tablet TAKE 1 TABLET(40 MG) BY MOUTH DAILY 90 tablet 0   clopidogrel (PLAVIX) 75 MG tablet TAKE 1 TABLET(75 MG) BY MOUTH DAILY 30 tablet 1   escitalopram (LEXAPRO) 20 MG tablet TAKE 1 TABLET(20 MG) BY MOUTH AT BEDTIME 90 tablet 3   HUMIRA PEN 40 MG/0.4ML PNKT Inject 40 mg as directed every 14 (fourteen) days.     hydroxychloroquine (PLAQUENIL) 200 MG tablet Take 1 tablet (200 mg total) by mouth 2 (two) times daily. (Patient taking differently: Take 400 mg by mouth at bedtime.) 60 tablet 0   LORazepam (ATIVAN) 0.5 MG tablet Take 1 tablet (0.5 mg total) by mouth  every 8 (eight) hours as needed for anxiety. 30 tablet 3   ondansetron (ZOFRAN) 8 MG tablet Take 1 tablet (8 mg total) by mouth every 8 (eight) hours as needed for nausea. (Patient not taking: Reported on 01/15/2023) 30 tablet 2   No current facility-administered medications on file prior to visit.    Allergies:   Allergies  Allergen Reactions   Amoxicillin Rash   Kenalog [Triamcinolone Acetonide] Rash    Rash noted after ankle injection by rheumatologist    Physical Exam General: Mildly obese pleasant middle-age Caucasian lady, seated, in no evident distress Head: head normocephalic and atraumatic.   Neck: supple with no carotid or supraclavicular  bruits Cardiovascular: regular rate and rhythm, no murmurs Musculoskeletal: no deformity Skin:  no rash/petichiae Vascular:  Normal pulses all extremities  Neurologic Exam Mental Status: Awake and fully alert. Oriented to place and time. Recent and remote memory intact. Attention span, concentration and fund of knowledge appropriate. Mood and affect appropriate.  Cranial Nerves: Fundoscopic exam reveals sharp disc margins. Pupils equal, briskly reactive to light. Extraocular movements full without nystagmus. Visual fields full to confrontation. Hearing intact. Facial sensation intact. Face, tongue, palate moves normally and symmetrically.  Motor: Normal bulk and tone. Normal strength in all tested extremity muscles. Sensory.: intact to touch , pinprick , position and vibratory sensation.  Coordination: Rapid alternating movements normal in all extremities. Finger-to-nose and heel-to-shin performed accurately bilaterally. Gait and Station: Arises from chair without difficulty. Stance is normal. Gait demonstrates normal stride length and balance . Able to heel, toe and tandem walk without difficulty.  Reflexes: 1+ and symmetric. Toes downgoing.   NIHSS  0 Modified Rankin  0   ASSESSMENT: 37 year old Caucasian lady with bilateral right  greater than left medial thalamic infarcts in July 2024 likely due to paradoxical embolism after micturition with a high risk PFO.  Extensive negative evaluation for cryptogenic stroke.  Vascular risk factors of hyperlipidemia, mild obesity, at risk for sleep apnea and PFO.     PLAN: I had a long d/w patient about his recent cryptogenic stroke, high risk PFO risk for recurrent stroke/TIAs, personally independently reviewed imaging studies and stroke evaluation results and answered questions.Continue aspirin 81 mg daily alone and discontinue Plavix now for secondary stroke prevention and maintain strict control of hypertension with blood pressure goal below 130/90, diabetes with hemoglobin A1c goal below 6.5% and lipids with LDL cholesterol goal below 70 mg/dL. I also advised the patient to eat a healthy diet with plenty of whole grains, cereals, fruits and vegetables, exercise regularly and maintain ideal body weight .check repeat lipid profile today and if LDL is suboptimal consider increasing Lipitor dose to 80 mg daily.  I encouraged her to keep her home sleep apnea testing appointment  and follow-up with Dr. Frances Furbish for the same.  Referred to interventional cardiologist for PFO closure as it appears to be high risk for PFO and her ROPE score of 7 puts her at 72% risk of stroke when related to the PFO.  Repeat beta-2 glycoprotein levels as they were mildly elevated TCD bubble study and lower extremity Dopplers.  Return for follow-up to see me in 4 months or call earlier if necessary. Greater than 50% time during this prolonged 60-minute consultation visit was spent in counseling and coordination of care about her cryptogenic stroke and discussion about her high risk PFO treatment options and need for aggressive risk factor modification and answering questions. Delia Heady, MD Note: This document was prepared with digital dictation and possible smart phrase technology. Any transcriptional errors that result  from this process are unintentional.

## 2023-01-15 NOTE — Patient Instructions (Addendum)
I had a long d/w patient about his recent cryptogenic stroke, high risk PFO risk for recurrent stroke/TIAs, personally independently reviewed imaging studies and stroke evaluation results and answered questions.Continue aspirin 81 mg daily alone and discontinue Plavix now for secondary stroke prevention and maintain strict control of hypertension with blood pressure goal below 130/90, diabetes with hemoglobin A1c goal below 6.5% and lipids with LDL cholesterol goal below 70 mg/dL. I also advised the patient to eat a healthy diet with plenty of whole grains, cereals, fruits and vegetables, exercise regularly and maintain ideal body weight .check repeat lipid profile today and if LDL is suboptimal consider increasing Lipitor dose to 80 mg daily.  I encouraged her to keep her home sleep apnea testing appointment  and follow-up with Dr. Frances Furbish for the same.  Referred to interventional cardiologist for PFO closure as it appears to be high risk for PFO and her ROPE score of 7 puts her at 72% risk of stroke when related to the PFO.  Repeat beta-2 glycoprotein levels as they were mildly elevated TCD bubble study and lower extremity Dopplers.  Return for follow-up to see me in 4 months or call earlier if necessary.  Patent Foramen Ovale, Adult  A foramen ovale is a hole between the upper chambers (right atrium and left atrium) of the heart. Before you are born, it is normal to have this hole in your heart. The hole allows blood to circulate through the body without having to go through the lungs. After your birth, when you are able to breathe, you do not need the foramen ovale and it usually closes. If the hole does not close, it is called a patent foramen ovale (PFO). PFO is a common condition. Most people do not know they have this hole, and they do not have any health problems caused by it. What are the causes? The cause of this condition is not known. What are the signs or symptoms? In most cases, there are no  symptoms of this condition. Possible rare symptoms include: Stroke caused by a blood clot. Migraine headaches. Platypnea-orthodeoxia syndrome. This is a condition in which a person has shortness of breath and decreased oxygen when seated or standing but feels better when lying down. How is this diagnosed? This condition may be diagnosed based on: A physical exam and your medical history. Echocardiogram. This test uses sound waves to produce images of the heart. Transesophageal echocardiogram (TEE). This type of echocardiogram is performed by placing a probe in the part of the body that moves food from the mouth to the stomach (esophagus). Electrocardiogram (ECG). This test identifies changes in the electrical activity of the heart. Cardiac MRI. This is an imaging technique that is used to visualize the heart, if further images are needed after TEE. How is this treated? Usually, no treatment is needed. If your condition is associated with symptoms or blood clots, you may need: Medicines to prevent blood clots and strokes (anticoagulant or antiplateletmedicines). A surgical procedure to close the hole (transcatheter closure). Follow these instructions at home: Take over-the-counter and prescription medicines only as told by your health care provider. Keep all follow-up visits. This is important. Contact a health care provider if: You have a fever. You have frequent or severe headaches. Get help right away if: Your skin turns blue. You have chest pain or difficulty breathing. You have any symptoms of stroke. "BE FAST" is an easy way to remember the main warning signs of stroke: B - Balance. Signs are  dizziness, sudden trouble walking, or loss of balance. E - Eyes. Signs are trouble seeing or a sudden change in vision. F - Face. Signs are sudden weakness or numbness of the face, or the face or eyelid drooping on one side. A - Arms. Signs are weakness or numbness in an arm. This happens  suddenly and usually on one side of the body. S - Speech. Signs are sudden trouble speaking, slurred speech, or trouble understanding what people say. T - Time. Time to call emergency services. Write down what time symptoms started. You have other signs of a stroke, such as: A sudden, severe headache with no known cause. Nausea or vomiting. Seizure. These symptoms may represent a serious problem that is an emergency. Do not wait to see if the symptoms will go away. Get medical help right away. Call your local emergency services (911 in the [place] Do not drive yourself to the hospital. Summary A patent foramen ovale is a hole between the upper chambers (right atrium and left atrium) of your heart. The cause of this condition is not known. You may not know that you have a hole in your heart, and you may not have any health problems from it. Usually, no treatment is needed for this condition unless you have symptoms or blood clots. This information is not intended to replace advice given to you by your health care provider. Make sure you discuss any questions you have with your health care provider. Document Revised: 02/02/2020 Document Reviewed: 02/02/2020 Elsevier Patient Education  2024 [employer].

## 2023-01-16 ENCOUNTER — Telehealth: Payer: Self-pay

## 2023-01-16 LAB — LIPID PANEL
Chol/HDL Ratio: 3.6 {ratio} (ref 0.0–4.4)
Cholesterol, Total: 118 mg/dL (ref 100–199)
HDL: 33 mg/dL — ABNORMAL LOW (ref >39)
LDL Chol Calc (NIH): 67 mg/dL (ref 0–99)
Triglycerides: 92 mg/dL (ref 0–149)
VLDL Cholesterol Cal: 18 mg/dL (ref 5–40)

## 2023-01-16 LAB — ANA COMPREHENSIVE PANEL
Anti JO-1: <0.2 AI (ref 0.0–0.9)
Centromere Ab Screen: <0.2 AI (ref 0.0–0.9)
Chromatin Ab SerPl-aCnc: 0.2 AI (ref 0.0–0.9)
ENA RNP Ab: 0.3 AI (ref 0.0–0.9)
ENA SM Ab Ser-aCnc: <0.2 AI (ref 0.0–0.9)
ENA SSA (RO) Ab: <0.2 AI (ref 0.0–0.9)
ENA SSB (LA) Ab: <0.2 AI (ref 0.0–0.9)
Scleroderma (Scl-70) (ENA) Antibody, IgG: <0.2 AI (ref 0.0–0.9)
dsDNA Ab: 1 [IU]/mL (ref 0–9)

## 2023-01-16 LAB — BETA-2-GLYCOPROTEIN I ABS, IGG/M/A
Beta-2 Glyco 1 IgA: 41 GPI IgA units — ABNORMAL HIGH (ref 0–25)
Beta-2 Glyco 1 IgM: <9 GPI IgM units (ref 0–32)
Beta-2 Glyco I IgG: <9 GPI IgG units (ref 0–20)

## 2023-01-16 LAB — LYME DISEASE SEROLOGY W/REFLEX: Lyme Total Antibody EIA: NEGATIVE

## 2023-01-16 NOTE — Telephone Encounter (Signed)
Per Dr. Pearlean Brownie, scheduled patient for PFO consult with Dr. Alverda Skeans 01/23/2023. She was grateful for call and agreed with plan.

## 2023-01-19 ENCOUNTER — Telehealth: Payer: Self-pay

## 2023-01-19 NOTE — Telephone Encounter (Signed)
[facility]ioform case review of 11/07/2022 TEE:

## 2023-01-22 ENCOUNTER — Encounter: Payer: Self-pay | Admitting: Nurse Practitioner

## 2023-01-22 ENCOUNTER — Ambulatory Visit: Payer: Managed Care, Other (non HMO) | Admitting: Nurse Practitioner

## 2023-01-22 VITALS — BP 122/82 | HR 70 | Wt 253.0 lb

## 2023-01-22 DIAGNOSIS — D500 Iron deficiency anemia secondary to blood loss (chronic): Secondary | ICD-10-CM | POA: Diagnosis not present

## 2023-01-22 DIAGNOSIS — E782 Mixed hyperlipidemia: Secondary | ICD-10-CM | POA: Diagnosis not present

## 2023-01-22 DIAGNOSIS — K760 Fatty (change of) liver, not elsewhere classified: Secondary | ICD-10-CM

## 2023-01-22 DIAGNOSIS — Z8673 Personal history of transient ischemic attack (TIA), and cerebral infarction without residual deficits: Secondary | ICD-10-CM

## 2023-01-22 DIAGNOSIS — Q2112 Patent foramen ovale: Secondary | ICD-10-CM | POA: Diagnosis not present

## 2023-01-22 DIAGNOSIS — Z23 Encounter for immunization: Secondary | ICD-10-CM | POA: Diagnosis not present

## 2023-01-22 NOTE — Progress Notes (Signed)
Laura Clamp, DNP, AGNP-c [facility]  [address] [place], [place] [address] [phone]  ESTABLISHED PATIENT- Chronic Health and/or Follow-Up Visit  Blood pressure 122/82, pulse 70, weight 253 lb (114.8 kg), last menstrual period 12/24/2022.    Laura [name] is a 37 y.o. year old female presenting today for evaluation and management of chronic conditions.   History of Present Illness The patient, a 37 year old individual with a recent history of stroke, presents for follow-up after a series of diagnostic tests and specialist consultations. The patient reports a struggle to attend all the appointments due to cancellations by both the neurologist and the cardiologist. The patient was initially referred to a rhythm specialist cardiologist who suggested the insertion of a loop recorder for heart monitoring, despite a 30-day heart monitor showing no abnormalities. This suggestion was met with concern by the patient, particularly due to the potential for a prolonged monitoring period of up to two years.  Upon consultation with a neurologist, the patient was advised that the loop recorder was unnecessary and that the focus should be on addressing a detected hole in the heart. The neurologist presented a risk assessment, known as a rope score, indicating a 72% chance that the stroke was caused by the heart defect.  The patient expressed a strong desire to have the heart surgery completed before the end of the year.  The patient also reports significant hair loss since hospital discharge, which she attributes to either stress or a potential mineral deficiency. The patient has also experienced heavy menstrual periods since starting blood thinners. The patient is not currently taking any iron supplements but is open to discussing this with the cardiologist.  In addition to the heart defect, the patient has a history of rheumatoid arthritis and has experienced difficulties  in obtaining accurate diagnoses in the past. The patient expresses relief at finally having answers and a clear treatment plan for her current health issues.  Following with Dr. Doneta Public (sp?)  All ROS negative with exception of what is listed above.   PHYSICAL EXAM Physical Exam Vitals and nursing note reviewed.  Constitutional:      Appearance: Normal appearance.  HENT:     Head: Normocephalic.  Eyes:     Pupils: Pupils are equal, round, and reactive to light.  Cardiovascular:     Rate and Rhythm: Normal rate and regular rhythm.     Pulses: Normal pulses.     Heart sounds: Normal heart sounds.  Pulmonary:     Effort: Pulmonary effort is normal.     Breath sounds: Normal breath sounds.  Musculoskeletal:        General: Normal range of motion.     Cervical back: Normal range of motion.  Skin:    General: Skin is warm.  Neurological:     General: No focal deficit present.     Mental Status: She is alert and oriented to person, place, and time.  Psychiatric:        Mood and Affect: Mood normal.      PLAN Problem List Items Addressed This Visit     Iron deficiency anemia    Patient reports significant hair loss, likely secondary to low iron levels. Menstrual bleeding has increased since starting anticoagulation therapy. -Consult with cardiologist regarding the safety of starting ferrous sulfate supplementation. -Consider further evaluation if menstrual bleeding does not improve after discontinuation of anticoagulation therapy.      Hepatic steatosis    Labs pending. No symptoms at this time.  Diet and exercise recommendations provided.       Mixed hyperlipidemia    LDL level is well controlled (67) on current statin therapy. -Continue current statin therapy. -Recheck cholesterol levels as directed by the neurologist.      History of ischemic stroke in prior three months    Suspected relation to PFO found on TEE. No murmur detected on examination today. She is on  anticoagulation and has no new or concerning symptoms present. Cardiology evaluation is pending for recommendations. Sleep study is pending.  -Continue to take the anticoagulant and maintain hydration.  -If any new or concerning symptoms start, please seek emergency care immediately.       PFO (patent foramen ovale)    Recent stroke likely secondary to PFO, as indicated by a high ROPE score. Patient is scheduled to see a structural cardiologist for further evaluation and potential closure of the PFO. -Continue current anticoagulation therapy until further guidance from the cardiologist.       Other Visit Diagnoses     Need for influenza vaccination    -  Primary   Relevant Orders   Flu vaccine trivalent PF, 6mos and older(Flulaval,Afluria,Fluarix,Fluzone) (Completed)       No follow-ups on file.  Laura Clamp, DNP, AGNP-c

## 2023-01-22 NOTE — Patient Instructions (Signed)
I am so happy that you finally have answers and it looks like you are on the road to recovery!!!   Ask about the iron safety at the cardiologist.

## 2023-01-23 ENCOUNTER — Encounter: Payer: Self-pay | Admitting: Internal Medicine

## 2023-01-23 ENCOUNTER — Ambulatory Visit: Payer: Managed Care, Other (non HMO) | Attending: Internal Medicine | Admitting: Internal Medicine

## 2023-01-23 VITALS — BP 118/84 | HR 68 | Ht 66.0 in | Wt 255.0 lb

## 2023-01-23 DIAGNOSIS — M069 Rheumatoid arthritis, unspecified: Secondary | ICD-10-CM

## 2023-01-23 DIAGNOSIS — D509 Iron deficiency anemia, unspecified: Secondary | ICD-10-CM

## 2023-01-23 DIAGNOSIS — I639 Cerebral infarction, unspecified: Secondary | ICD-10-CM | POA: Diagnosis not present

## 2023-01-23 NOTE — Patient Instructions (Signed)
Medication Instructions:  No changes *If you need a refill on your cardiac medications before your next appointment, please call your pharmacy*   Lab Work: None today If you have labs (blood work) drawn today and your tests are completely normal, you will receive your results only by: MyChart Message (if you have MyChart) OR A paper copy in the mail If you have any lab test that is abnormal or we need to change your treatment, we will call you to review the results.   Testing/Procedures: none   Follow-Up: Per Structural Heart Team Laura [name] will be in touch with you for next steps.

## 2023-01-23 NOTE — Progress Notes (Signed)
Cardiology Office Note:   Date:  01/23/2023  ID:  Laura [name], DOB 04-17-1985, MRN [id number] PCP:  Tollie Eth, NP  [facility] Providers Cardiologist:  Alverda Skeans, MD Referring MD: Tollie Eth, NP  Chief Complaint/Reason for Referral:  Stroke with PFO ASSESSMENT:    1. Acute ischemic stroke (HCC)   2. Rheumatoid arthritis, involving unspecified site, unspecified whether rheumatoid factor present (HCC)   3. Iron deficiency anemia, unspecified iron deficiency anemia type     PLAN:   In order of problems listed above: Stroke: I reviewed the patient's TEE which demonstrates a PFO and may be another more inferior defect.  Her bubble study is positive.  She has not yet had a transcranial Doppler study as requested by Dr. Pearlean Brownie.  I think PFO closure in this patient is indicated to reduce her risk of recurrent stroke in the future.  She does have heavy menstrual bleeding while on dual antiplatelet therapy.  I did tell her she will need to do antiplatelet therapy for 6 months and antibiotics for any dental work including cleanings after PFO closure.  We will schedule this for the near future.  All questions were answered. Rheumatoid arthritis: Continue immunotherapy. Iron deficiency anemia: Continue current therapy.           Dispo:  No follow-ups on file.      Medication Adjustments/Labs and Tests Ordered: Current medicines are reviewed at length with the patient today.  Concerns regarding medicines are outlined above.  The following changes have been made:  no change   Labs/tests ordered: No orders of the defined types were placed in this encounter.   Medication Changes: No orders of the defined types were placed in this encounter.   Current medicines are reviewed at length with the patient today.  The patient does not have concerns regarding medicines.  History of Present Illness:      FOCUSED PROBLEM LIST:   Stroke with symptoms of diplopia July  2024 Head CT, CTA head and neck negative however MRI with bilateral thalamic infarction  Bubble study echocardiogram and TEE positive for intracardiac shunt; ROPE score 7 30-day monitor negative for atrial fibrillation Lower extremity Dopplers negative Hypercoagulable and urinary drug screen negative BMI 40 Rheumatoid arthritis on Humira and Plaquenil  The patient is a 37 year old female with the above listed medical problems referred by neurology for recommendations regarding PFO closure in the context of a young individual with a history of cryptogenic stroke.  The patient presented in July with acute onset diplopia.  Her evaluation is detailed above and was negative.  The patient tells me that she was in her normal state of health up until July.  She developed acute onset diplopia.  This had never happened before.  She denies any previous palpitations or any other cardiovascular symptomatology whatsoever.  She is does not smoke.  She had been on immunotherapy for rheumatoid arthritis for the last few years.  She does have issues with low serum iron and has been on oral iron supplementation as well.  Since being discharged from the hospital she has had no issues.  She denies any recurrent signs or symptoms of stroke, palpitations, lower extremity unilateral swelling, or serious bleeding or bruising while on dual antiplatelet therapy.  She was seen by Dr. Pearlean Brownie with plans for repeat lower extremity Doppler study and a transcranial Doppler study.  These had not yet been done.        Current Medications: Current Meds  Medication Sig   acetaminophen (TYLENOL) 500 MG tablet Take 500 mg by mouth as needed for moderate pain.   aspirin 81 MG chewable tablet Chew 1 tablet (81 mg total) by mouth daily.   atorvastatin (LIPITOR) 40 MG tablet TAKE 1 TABLET(40 MG) BY MOUTH DAILY   escitalopram (LEXAPRO) 20 MG tablet TAKE 1 TABLET(20 MG) BY MOUTH AT BEDTIME   HUMIRA PEN 40 MG/0.4ML PNKT Inject 40 mg as  directed every 14 (fourteen) days.   hydroxychloroquine (PLAQUENIL) 200 MG tablet Take 1 tablet (200 mg total) by mouth 2 (two) times daily. (Patient taking differently: Take 400 mg by mouth at bedtime.)   LORazepam (ATIVAN) 0.5 MG tablet Take 1 tablet (0.5 mg total) by mouth every 8 (eight) hours as needed for anxiety.   ondansetron (ZOFRAN) 8 MG tablet Take 1 tablet (8 mg total) by mouth every 8 (eight) hours as needed for nausea.     Review of Systems:   Please see the history of present illness.    All other systems reviewed and are negative.     EKGs/Labs/Other Test Reviewed:   EKG: EKG that I reviewed from July 2023 demonstrates normal sinus rhythm  EKG Interpretation Date/Time:    Ventricular Rate:    PR Interval:    QRS Duration:    QT Interval:    QTC Calculation:   R Axis:      Text Interpretation:           Risk Assessment/Calculations:          Physical Exam:   VS:  BP 118/84   Pulse 68   Ht 5\' 6"  (1.676 m)   Wt 255 lb (115.7 kg)   LMP 12/24/2022   BMI 41.16 kg/m        Wt Readings from Last 3 Encounters:  01/23/23 255 lb (115.7 kg)  01/22/23 253 lb (114.8 kg)  01/15/23 253 lb (114.8 kg)      GENERAL:  No apparent distress, AOx3 HEENT:  No carotid bruits, +2 carotid impulses, no scleral icterus CAR: RRR no murmurs, gallops, rubs, or thrills RES:  Clear to auscultation bilaterally ABD:  Soft, nontender, nondistended, positive bowel sounds x 4 VASC:  +2 radial pulses, +2 carotid pulses NEURO:  CN 2-12 grossly intact; motor and sensory grossly intact PSYCH:  No active depression or anxiety EXT:  No edema, ecchymosis, or cyanosis  Signed, Orbie Pyo, MD  01/23/2023 10:59 AM    [facility] [address] [place], [place], [place]  [address] Phone: [phone]; Fax: [phone]   Note:  This document was prepared using Dragon voice recognition software and may include unintentional dictation errors.

## 2023-01-27 ENCOUNTER — Other Ambulatory Visit: Payer: Self-pay

## 2023-01-27 ENCOUNTER — Encounter (HOSPITAL_COMMUNITY): Payer: Self-pay | Admitting: Emergency Medicine

## 2023-01-27 ENCOUNTER — Emergency Department (HOSPITAL_COMMUNITY)
Admission: EM | Admit: 2023-01-27 | Discharge: 2023-01-27 | Disposition: A | Discharge status: Home / Self Care | Payer: Managed Care, Other (non HMO) | Attending: Emergency Medicine | Admitting: Emergency Medicine

## 2023-01-27 ENCOUNTER — Emergency Department (HOSPITAL_COMMUNITY): Payer: Managed Care, Other (non HMO)

## 2023-01-27 DIAGNOSIS — Z7901 Long term (current) use of anticoagulants: Secondary | ICD-10-CM | POA: Insufficient documentation

## 2023-01-27 DIAGNOSIS — H532 Diplopia: Secondary | ICD-10-CM | POA: Insufficient documentation

## 2023-01-27 DIAGNOSIS — Z7982 Long term (current) use of aspirin: Secondary | ICD-10-CM | POA: Insufficient documentation

## 2023-01-27 MED ORDER — CLOPIDOGREL BISULFATE 75 MG PO TABS
ORAL_TABLET | ORAL | Status: AC
  Filled 2023-01-27: qty 1

## 2023-01-27 MED ORDER — CLOPIDOGREL BISULFATE 75 MG PO TABS
75.0000 mg | ORAL_TABLET | Freq: Once | ORAL | Status: AC
  Administered 2023-01-27: 75 mg via ORAL

## 2023-01-27 MED ORDER — CLOPIDOGREL BISULFATE 75 MG PO TABS: 75.0000 mg | ORAL_TABLET | Freq: Every day | ORAL | 0 refills | Status: AC

## 2023-01-27 NOTE — Discharge Instructions (Addendum)
The MRI did not show signs of a new stroke today in the ER.  The on-call neurologist recommended that we restart Plavix for the next 3 weeks, in addition to your aspirin.  Your next dose of Plavix is due tomorrow morning.  However, it is important that you contact your stroke neurologist Dr Pearlean Brownie tomorrow at the office and let them know about your visit to the ER.  They may wish to see you again in the office.  If you have new, recurring or worsening neurological or stroke symptoms, including blurred vision, loss of vision, severe headache, persistent vomiting, balance problems, numbness or weakness of the arms or legs, or slurred speech, or any other emergency medical concerns, please call [phone] or return to the ER.

## 2023-01-27 NOTE — ED Provider Notes (Signed)
[place] EMERGENCY DEPARTMENT AT [facility] Provider Note   CSN: [id number] Arrival date & time: 01/27/23  1826     History  Chief Complaint  Patient presents with   Diplopia    Laura [name] is a 37 y.o. female presenting to the emergency department with complaint of double vision.  This began around 5 PM this evening after she woke up from a nap.  She said that similar to her exactly the same as when she had a stroke, back in July when she was admitted to the hospital.  The symptoms have since completely resolved and she feels back to normal.  She denies headache, facial numbness or weakness, slurred speech, numbness of the arms or legs.  She reports that she is on a statin for high cholesterol, 81 mg of aspirin.  She was taken off of Plavix by her neurologist and her cardiologist last week because she had completed the initial course of Plavix Po stroke.  She is also on Humira and Plaquenil for rheumatoid arthritis.  I reviewed her external records which include hospital discharge summary from July 2024, for admission for acute infarct in the medial aspect of the right thalamus.  Echo showed normal EF, likely intraatrial shunt on bubble study.  Patient reports cardiology planning PFO closure by the end of this year.  HPI     Home Medications Prior to Admission medications   Medication Sig Start Date End Date Taking? Authorizing Provider  acetaminophen (TYLENOL) 500 MG tablet Take 500 mg by mouth as needed for moderate pain.   Yes [provider]  aspirin 81 MG chewable tablet Chew 1 tablet (81 mg total) by mouth daily. 11/08/22  Yes Burnadette Pop, MD  atorvastatin (LIPITOR) 40 MG tablet TAKE 1 TABLET(40 MG) BY MOUTH DAILY 12/30/22  Yes Early, Sung Amabile, NP  clopidogrel (PLAVIX) 75 MG tablet Take 1 tablet (75 mg total) by mouth daily for 21 days. 01/28/23 02/18/23 Yes Lisbeth Puller, Kermit Balo, MD  escitalopram (LEXAPRO) 20 MG tablet TAKE 1 TABLET(20 MG) BY MOUTH  AT BEDTIME 07/22/22  Yes Early, Sung Amabile, NP  LORazepam (ATIVAN) 0.5 MG tablet Take 1 tablet (0.5 mg total) by mouth every 8 (eight) hours as needed for anxiety. 11/17/22  Yes Early, Sung Amabile, NP  ondansetron (ZOFRAN) 8 MG tablet Take 1 tablet (8 mg total) by mouth every 8 (eight) hours as needed for nausea. 07/22/22  Yes Early, Sung Amabile, NP  HUMIRA PEN 40 MG/0.4ML PNKT Inject 40 mg as directed every 14 (fourteen) days. Patient not taking: Reported on 01/27/2023 07/19/20   [provider]      Allergies    Amoxicillin and Kenalog [triamcinolone acetonide]    Review of Systems   Review of Systems  Physical Exam Updated Vital Signs BP 111/75   Pulse 73   Temp 98.3 F (36.8 C)   Resp 15   Ht 5\' 6"  (1.676 m)   Wt 115.7 kg   LMP 12/24/2022   SpO2 97%   BMI 41.16 kg/m  Physical Exam Constitutional:      General: She is not in acute distress. HENT:     Head: Normocephalic and atraumatic.  Eyes:     Conjunctiva/sclera: Conjunctivae normal.     Pupils: Pupils are equal, round, and reactive to light.  Cardiovascular:     Rate and Rhythm: Normal rate and regular rhythm.  Pulmonary:     Effort: Pulmonary effort is normal. No respiratory distress.  Breath sounds: Normal breath sounds.  Abdominal:     General: There is no distension.     Tenderness: There is no abdominal tenderness.  Skin:    General: Skin is warm and dry.  Neurological:     General: No focal deficit present.     Mental Status: She is alert and oriented to person, place, and time. Mental status is at baseline.     Cranial Nerves: No cranial nerve deficit.     Sensory: No sensory deficit.     Motor: No weakness.     Gait: Gait normal.  Psychiatric:        Mood and Affect: Mood normal.        Behavior: Behavior normal.     ED Results / Procedures / Treatments   Labs (all labs ordered are listed, but only abnormal results are displayed) Labs Reviewed - No data to display  EKG None  Radiology MR BRAIN WO  CONTRAST  Result Date: 01/27/2023 CLINICAL DATA:  Transient ischemic attack, double vision EXAM: MRI HEAD WITHOUT CONTRAST TECHNIQUE: Multiplanar, multiecho pulse sequences of the brain and surrounding structures were obtained without intravenous contrast. COMPARISON:  11/04/2022 FINDINGS: Brain: No restricted diffusion to suggest acute or subacute infarct. No acute hemorrhage, mass, mass effect, or midline shift. No hydrocephalus or extra-axial collection. Normal pituitary and craniocervical junction. No hemosiderin deposition to suggest remote hemorrhage. Remote lacunar infarct in the medial right thalamus. Vascular: Normal arterial flow voids. Skull and upper cervical spine: Normal marrow signal. Sinuses/Orbits: Clear paranasal sinuses. No acute finding in the orbits. Other: The mastoid air cells are well aerated. IMPRESSION: No acute intracranial process. No evidence of acute or subacute infarct. Electronically Signed   By: Wiliam Ke M.D.   On: 01/27/2023 22:06    Procedures Procedures    Medications Ordered in ED Medications  clopidogrel (PLAVIX) tablet 75 mg (has no administration in time range)    ED Course/ Medical Decision Making/ A&P Clinical Course as of 01/27/23 2225  Tue Jan 27, 2023  2225 Patient reassessed remains completely asymptomatic.  Per my discussion with neurology we will give her 75 mg of Plavix and discharge her home.  Her family is here to take her home.  They verbalized understanding [MT]    Clinical Course User Index [MT] Jahzion Brogden, Kermit Balo, MD                                 Medical Decision Making Amount and/or Complexity of Data Reviewed Radiology: ordered.  Risk Prescription drug management.   Blurred vision, now resolved  Ddx includes CVA vs TIA vs recrudescence vs migraine vs other  NIHSS 0 on arrival - symptoms resolved  Pending MRI brain  MR imaging reviewed personally, showing no acute infarct  External records reviewed - prior  hospital discharge summary, echocardiogram report, July 2024  I discussed the case by phone with the on-call neurologist Dr Amada Jupiter, and given that the patient is completely asymptomatic at this time with no acute findings, with a very recent stroke workup in the hospital, there is no indication for readmission at this time.  The patient could be started back on Plavix 75 mg in addition to her aspirin for a minimum of 3 weeks, but should contact her follow-up stroke neurologist tomorrow morning to be seen again in the office.  The patient and her family members are comfortable with this plan when I discussed  with them.  She will be given Plavix in the ED and then discharge.        Final Clinical Impression(s) / ED Diagnoses Final diagnoses:  Diplopia    Rx / DC Orders ED Discharge Orders          Ordered    clopidogrel (PLAVIX) 75 MG tablet  Daily        01/27/23 2225              Terald Sleeper, MD 01/27/23 2225

## 2023-01-27 NOTE — ED Triage Notes (Signed)
Patient arrives ambulatory by POV concerned for double vision about an hour ago and lasted about 40 mins. Patient reports vision is now completely back to normal now. Denies any numbness, tingling or any neuro symptoms. Patient states she was taken off her plavix 4 days ago by neurology and cardiology.

## 2023-01-28 ENCOUNTER — Ambulatory Visit: Payer: Managed Care, Other (non HMO) | Admitting: Neurology

## 2023-01-28 ENCOUNTER — Encounter: Payer: Self-pay | Admitting: Neurology

## 2023-01-28 ENCOUNTER — Encounter: Payer: Self-pay | Admitting: Internal Medicine

## 2023-01-28 DIAGNOSIS — Z9189 Other specified personal risk factors, not elsewhere classified: Secondary | ICD-10-CM

## 2023-01-28 DIAGNOSIS — R0683 Snoring: Secondary | ICD-10-CM

## 2023-01-28 DIAGNOSIS — R519 Headache, unspecified: Secondary | ICD-10-CM

## 2023-01-28 DIAGNOSIS — Z8673 Personal history of transient ischemic attack (TIA), and cerebral infarction without residual deficits: Secondary | ICD-10-CM

## 2023-01-28 DIAGNOSIS — G4719 Other hypersomnia: Secondary | ICD-10-CM

## 2023-01-28 DIAGNOSIS — Q2112 Patent foramen ovale: Secondary | ICD-10-CM

## 2023-01-28 DIAGNOSIS — R351 Nocturia: Secondary | ICD-10-CM

## 2023-01-28 DIAGNOSIS — Z820 Family history of epilepsy and other diseases of the nervous system: Secondary | ICD-10-CM

## 2023-02-02 ENCOUNTER — Telehealth: Payer: Self-pay

## 2023-02-02 ENCOUNTER — Other Ambulatory Visit: Payer: Self-pay

## 2023-02-02 DIAGNOSIS — I639 Cerebral infarction, unspecified: Secondary | ICD-10-CM

## 2023-02-02 DIAGNOSIS — Q2112 Patent foramen ovale: Secondary | ICD-10-CM

## 2023-02-02 NOTE — Telephone Encounter (Signed)
Scheduled the patient for PFO closure 02/26/2023. She will have pre-procedure visit 11/8. She understands Neurology will be in touch soon to arrange TCD study. She was grateful for call and agreed with plan.

## 2023-02-02 NOTE — Telephone Encounter (Signed)
-----   Message from Delia Heady sent at 01/30/2023  8:15 AM EDT ----- Go ahead and schedule closure and we will get TCD bubble  done next week ----- Message ----- From: Henrietta Dine, RN Sent: 01/29/2023  12:34 PM EDT To: Micki Riley, MD  Hello Dr. Pearlean Brownie,  Dr. Lynnette Caffey saw this patient for PFO consult and would like to proceed with closure. Would you like to have TCD bubble done first or does it matter? I'm trying to schedule appropriately.   Thank you!  Orpha Bur, RN Structural Heart team

## 2023-02-05 ENCOUNTER — Encounter: Payer: Self-pay | Admitting: Neurology

## 2023-02-09 DIAGNOSIS — Q2112 Patent foramen ovale: Secondary | ICD-10-CM | POA: Insufficient documentation

## 2023-02-09 NOTE — Assessment & Plan Note (Signed)
Recent stroke likely secondary to PFO, as indicated by a high ROPE score. Patient is scheduled to see a structural cardiologist for further evaluation and potential closure of the PFO. -Continue current anticoagulation therapy until further guidance from the cardiologist.

## 2023-02-09 NOTE — Assessment & Plan Note (Signed)
Labs pending. No symptoms at this time. Diet and exercise recommendations provided.

## 2023-02-09 NOTE — Assessment & Plan Note (Signed)
LDL level is well controlled (67) on current statin therapy. -Continue current statin therapy. -Recheck cholesterol levels as directed by the neurologist.

## 2023-02-09 NOTE — Assessment & Plan Note (Addendum)
Suspected relation to PFO found on TEE. No murmur detected on examination today. She is on anticoagulation and has no new or concerning symptoms present. Cardiology evaluation is pending for recommendations. Sleep study is pending.  -Continue to take the anticoagulant and maintain hydration.  -If any new or concerning symptoms start, please seek emergency care immediately.

## 2023-02-09 NOTE — Assessment & Plan Note (Signed)
Patient reports significant hair loss, likely secondary to low iron levels. Menstrual bleeding has increased since starting anticoagulation therapy. -Consult with cardiologist regarding the safety of starting ferrous sulfate supplementation. -Consider further evaluation if menstrual bleeding does not improve after discontinuation of anticoagulation therapy.

## 2023-02-16 ENCOUNTER — Telehealth: Payer: Self-pay

## 2023-02-16 NOTE — Telephone Encounter (Signed)
Patient was mailed disposable home sleep study device on 01/28/23. As of 02/16/23 device has not been used and no contact has been made from the patient. The sleep lab has attempted to contact the patient to use their sleep study device but has not heard back from the patient. Device has now been disconnected in [employer] portal. Patient may be charged $300 fee for not using device within time frame given per instructions given with home sleep study.

## 2023-02-19 NOTE — H&P (View-Only) (Signed)
[facility]   MULTIDISCIPLINARY HEART VALVE CLINIC                                     Cardiology Office Note:    Date:  02/20/2023   ID:  Laura [name], DOB [birth year], MRN [id number]  PCP:  Tollie Eth, NP  [facility] Cardiologist:  Orbie Pyo, MD  [facility] Electrophysiologist:  Maurice Small, MD   Referring MD: Tollie Eth, NP   Pre PFO closure visit   History of Present Illness:    Laura [name] is a 37 y.o. female with a hx of depression/anxiety, rheumatoid arthritis on Humira and Plaquenil, morbid obesity (BMI 40), iron deficiency anemia, menorrhagia, cryptogenic CVA and PFO with planned percutaneous PFO closure on 02/26/23 who presents to clinic for follow up.  She presented with stroke with symptoms of diplopia 10/2022. Head CT, CTA head and neck negative however MRI with bilateral thalamic infarction. Bubble study echocardiogram and TEE positive for intracardiac shunt; ROPE score 7. 30-day monitor negative for atrial fibrillation. Lower extremity Dopplers negative.Hypercoagulable and urinary drug screen negative.   She was seen by Dr. Lynnette Caffey on 01/23/23 who felt like she was a good candidate for transcatheter PFO closure which was set up for 02/26/23. Of note, she has a LE dopplers and a transcranial doppler set up for 02/25/23. She did have a recurrent episode of diplopia and was seen in the ER on 01/27/23 and seen in the ER. MRI was okay but restarted on Plavix 75mg  daily.   Today the patient presents to clinic for follow up. No CP or SOB. No LE edema, orthopnea or PND. No dizziness or syncope. No blood in stool or urine. No palpitations.    Past Medical History:  Diagnosis Date   Anemia    Anxiety    Anxiety 12/31/2018   Arthritis    Depression    Depression    [employer] 02/25/2020   Depressive disorder 03/28/2014   Establishing care with new doctor, encounter for 07/30/2020   Flatulence, eructation and gas pain  07/30/2020   Frequent headaches    History of chicken pox    Otitis media    had tubes as child, then according to pt adipose tissue used to repair ear drum   Rheumatoid arthritis (HCC)    Stroke (HCC)    UTI (lower urinary tract infection)     Past Surgical History:  Procedure Laterality Date   APPENDECTOMY  2012   HYMENECTOMY  2000   TEE WITHOUT CARDIOVERSION N/A 11/07/2022   Procedure: TRANSESOPHAGEAL ECHOCARDIOGRAM;  Surgeon: Lewayne Bunting, MD;  Location: [facility] INVASIVE CV LAB;  Service: Cardiovascular;  Laterality: N/A;   TONSILECTOMY, ADENOIDECTOMY, BILATERAL MYRINGOTOMY AND TUBES      Current Medications: Current Meds  Medication Sig   acetaminophen (TYLENOL) 500 MG tablet Take 500 mg by mouth as needed for moderate pain.   aspirin 81 MG chewable tablet Chew 1 tablet (81 mg total) by mouth daily.   atorvastatin (LIPITOR) 40 MG tablet TAKE 1 TABLET(40 MG) BY MOUTH DAILY   clopidogrel (PLAVIX) 75 MG tablet Take 1 tablet (75 mg total) by mouth daily.   escitalopram (LEXAPRO) 20 MG tablet TAKE 1 TABLET(20 MG) BY MOUTH AT BEDTIME   HUMIRA PEN 40 MG/0.4ML PNKT Inject 40 mg as directed every 14 (fourteen) days.   LORazepam (ATIVAN) 0.5 MG tablet  Take 1 tablet (0.5 mg total) by mouth every 8 (eight) hours as needed for anxiety.   ondansetron (ZOFRAN) 8 MG tablet Take 1 tablet (8 mg total) by mouth every 8 (eight) hours as needed for nausea.      ROS:   Please see the history of present illness.    All other systems reviewed and are negative.  EKGs   EKG:  EKG is ordered today.  The ekg ordered today demonstrates sinus with low voltage HR 70  Recent Labs: 11/17/2022: ALT 19; BUN 11; Creatinine, Ser 0.86; Potassium 4.5; Sodium 140 12/11/2022: Hemoglobin 13.0; Platelets 291  Recent Lipid Panel    Component Value Date/Time   CHOL 118 01/15/2023 1629   TRIG 92 01/15/2023 1629   HDL 33 (L) 01/15/2023 1629   CHOLHDL 3.6 01/15/2023 1629   CHOLHDL 6.4 11/05/2022 0047   VLDL 27  11/05/2022 0047   LDLCALC 67 01/15/2023 1629   LDLDIRECT 122 (H) 08/06/2021 1411     Risk Assessment/Calculations:            Physical Exam:    VS:  BP 122/84 (BP Location: Left Arm, Patient Position: Sitting, Cuff Size: Large)   Pulse 73   Resp 16   Ht 5\' 6"  (1.676 m)   Wt 253 lb (114.8 kg)   LMP 12/24/2022   SpO2 95%   BMI 40.84 kg/m     Wt Readings from Last 3 Encounters:  02/20/23 253 lb (114.8 kg)  01/27/23 255 lb (115.7 kg)  01/23/23 255 lb (115.7 kg)     GEN: Well nourished, well developed in no acute distress NECK: No JVD; No carotid bruits CARDIAC: RRR, no murmurs, rubs, gallops RESPIRATORY:  Clear to auscultation without rales, wheezing or rhonchi  ABDOMEN: Soft, non-tender, non-distended EXTREMITIES:  No edema; No deformity   ASSESSMENT:    1. Patent foramen ovale   2. Cryptogenic stroke (HCC)   3. Rheumatoid arthritis, involving unspecified site, unspecified whether rheumatoid factor present (HCC)   4. Iron deficiency anemia due to chronic blood loss   5. Excessive menstruation at puberty   6. Pre-procedural cardiovascular examination    PLAN:    In order of problems listed above:  Patent foramen ovale: continue on aspirin 81mg  daily and Plavix 75mg  daily. She will take her last dose of Plavix on 11/10 to hold for 3 days prior to surgery. She will then be restarted on DAPT x6 months. We discussed the need for SBE prophylaxis x 6 months after closure. ECG/BMET/CBC today. Letter given with instructions for PFO closure on 02/26/23.  Cryptogenic CVA: continue DAPT as above and Lipitor 40mg  daily. Planned PFO closure as above. Has LE dopplers and transcranial doppler set up for 02/25/23.  Rheumatoid arthritis: continue immunotherapy with Humira 40mg   Iron deficiency anemia: continue on iron supplementation  Menorrhagia: this has been worse on DAPT but understands this is just temporary. Will continue on iron.   Medication Adjustments/Labs and Tests  Ordered: Current medicines are reviewed at length with the patient today.  Concerns regarding medicines are outlined above.  Orders Placed This Encounter  Procedures   Basic metabolic panel   CBC   EKG 12-Lead   Meds ordered this encounter  Medications   clopidogrel (PLAVIX) 75 MG tablet    Sig: Take 1 tablet (75 mg total) by mouth daily.    Patient Instructions  Lab Work: CBC, BMET today If you have labs (blood work) drawn today and your tests are completely normal, you  will receive your results only by: MyChart Message (if you have MyChart) OR A paper copy in the mail If you have any lab test that is abnormal or we need to change your treatment, we will call you to review the results.  Testing/Procedures: Upcoming PFO  Follow-Up: At [facility], you and your health needs are our priority.  As part of our continuing mission to provide you with exceptional heart care, we have created designated Provider Care Teams.  These Care Teams include your primary Cardiologist (physician) and Advanced Practice Providers (APPs -  Physician Assistants and Nurse Practitioners) who all work together to provide you with the care you need, when you need it.  Your next appointment:   Structural Team will follow-up  Provider:   Alverda Skeans, MD, Georgie Chard, NP, or Carlean Jews, PA-C       Signed, Cline Crock, PA-C  02/20/2023 10:23 AM    [facility]

## 2023-02-19 NOTE — Progress Notes (Signed)
[facility]   MULTIDISCIPLINARY HEART VALVE CLINIC                                     Cardiology Office Note:    Date:  02/20/2023   ID:  Laura [name], DOB [birth year], MRN [id number]  PCP:  Tollie Eth, NP  [facility] Cardiologist:  Orbie Pyo, MD  [facility] Electrophysiologist:  Maurice Small, MD   Referring MD: Tollie Eth, NP   Pre PFO closure visit   History of Present Illness:    Laura [name] is a 37 y.o. female with a hx of depression/anxiety, rheumatoid arthritis on Humira and Plaquenil, morbid obesity (BMI 40), iron deficiency anemia, menorrhagia, cryptogenic CVA and PFO with planned percutaneous PFO closure on 02/26/23 who presents to clinic for follow up.  She presented with stroke with symptoms of diplopia 10/2022. Head CT, CTA head and neck negative however MRI with bilateral thalamic infarction. Bubble study echocardiogram and TEE positive for intracardiac shunt; ROPE score 7. 30-day monitor negative for atrial fibrillation. Lower extremity Dopplers negative.Hypercoagulable and urinary drug screen negative.   She was seen by Dr. Lynnette Caffey on 01/23/23 who felt like she was a good candidate for transcatheter PFO closure which was set up for 02/26/23. Of note, she has a LE dopplers and a transcranial doppler set up for 02/25/23. She did have a recurrent episode of diplopia and was seen in the ER on 01/27/23 and seen in the ER. MRI was okay but restarted on Plavix 75mg  daily.   Today the patient presents to clinic for follow up. No CP or SOB. No LE edema, orthopnea or PND. No dizziness or syncope. No blood in stool or urine. No palpitations.    Past Medical History:  Diagnosis Date   Anemia    Anxiety    Anxiety 12/31/2018   Arthritis    Depression    Depression    Phreesia 02/25/2020   Depressive disorder 03/28/2014   Establishing care with new doctor, encounter for 07/30/2020   Flatulence, eructation and gas pain  07/30/2020   Frequent headaches    History of chicken pox    Otitis media    had tubes as child, then according to pt adipose tissue used to repair ear drum   Rheumatoid arthritis (HCC)    Stroke (HCC)    UTI (lower urinary tract infection)     Past Surgical History:  Procedure Laterality Date   APPENDECTOMY  2012   HYMENECTOMY  2000   TEE WITHOUT CARDIOVERSION N/A 11/07/2022   Procedure: TRANSESOPHAGEAL ECHOCARDIOGRAM;  Surgeon: Lewayne Bunting, MD;  Location: [facility] INVASIVE CV LAB;  Service: Cardiovascular;  Laterality: N/A;   TONSILECTOMY, ADENOIDECTOMY, BILATERAL MYRINGOTOMY AND TUBES      Current Medications: Current Meds  Medication Sig   acetaminophen (TYLENOL) 500 MG tablet Take 500 mg by mouth as needed for moderate pain.   aspirin 81 MG chewable tablet Chew 1 tablet (81 mg total) by mouth daily.   atorvastatin (LIPITOR) 40 MG tablet TAKE 1 TABLET(40 MG) BY MOUTH DAILY   clopidogrel (PLAVIX) 75 MG tablet Take 1 tablet (75 mg total) by mouth daily.   escitalopram (LEXAPRO) 20 MG tablet TAKE 1 TABLET(20 MG) BY MOUTH AT BEDTIME   HUMIRA PEN 40 MG/0.4ML PNKT Inject 40 mg as directed every 14 (fourteen) days.   LORazepam (ATIVAN) 0.5 MG tablet  Take 1 tablet (0.5 mg total) by mouth every 8 (eight) hours as needed for anxiety.   ondansetron (ZOFRAN) 8 MG tablet Take 1 tablet (8 mg total) by mouth every 8 (eight) hours as needed for nausea.      ROS:   Please see the history of present illness.    All other systems reviewed and are negative.  EKGs   EKG:  EKG is ordered today.  The ekg ordered today demonstrates sinus with low voltage HR 70  Recent Labs: 11/17/2022: ALT 19; BUN 11; Creatinine, Ser 0.86; Potassium 4.5; Sodium 140 12/11/2022: Hemoglobin 13.0; Platelets 291  Recent Lipid Panel    Component Value Date/Time   CHOL 118 01/15/2023 1629   TRIG 92 01/15/2023 1629   HDL 33 (L) 01/15/2023 1629   CHOLHDL 3.6 01/15/2023 1629   CHOLHDL 6.4 11/05/2022 0047   VLDL 27  11/05/2022 0047   LDLCALC 67 01/15/2023 1629   LDLDIRECT 122 (H) 08/06/2021 1411     Risk Assessment/Calculations:            Physical Exam:    VS:  BP 122/84 (BP Location: Left Arm, Patient Position: Sitting, Cuff Size: Large)   Pulse 73   Resp 16   Ht 5\' 6"  (1.676 m)   Wt 253 lb (114.8 kg)   LMP 12/24/2022   SpO2 95%   BMI 40.84 kg/m     Wt Readings from Last 3 Encounters:  02/20/23 253 lb (114.8 kg)  01/27/23 255 lb (115.7 kg)  01/23/23 255 lb (115.7 kg)     GEN: Well nourished, well developed in no acute distress NECK: No JVD; No carotid bruits CARDIAC: RRR, no murmurs, rubs, gallops RESPIRATORY:  Clear to auscultation without rales, wheezing or rhonchi  ABDOMEN: Soft, non-tender, non-distended EXTREMITIES:  No edema; No deformity   ASSESSMENT:    1. Patent foramen ovale   2. Cryptogenic stroke (HCC)   3. Rheumatoid arthritis, involving unspecified site, unspecified whether rheumatoid factor present (HCC)   4. Iron deficiency anemia due to chronic blood loss   5. Excessive menstruation at puberty   6. Pre-procedural cardiovascular examination    PLAN:    In order of problems listed above:  Patent foramen ovale: continue on aspirin 81mg  daily and Plavix 75mg  daily. She will take her last dose of Plavix on 11/10 to hold for 3 days prior to surgery. She will then be restarted on DAPT x6 months. We discussed the need for SBE prophylaxis x 6 months after closure. ECG/BMET/CBC today. Letter given with instructions for PFO closure on 02/26/23.  Cryptogenic CVA: continue DAPT as above and Lipitor 40mg  daily. Planned PFO closure as above. Has LE dopplers and transcranial doppler set up for 02/25/23.  Rheumatoid arthritis: continue immunotherapy with Humira 40mg   Iron deficiency anemia: continue on iron supplementation  Menorrhagia: this has been worse on DAPT but understands this is just temporary. Will continue on iron.   Medication Adjustments/Labs and Tests  Ordered: Current medicines are reviewed at length with the patient today.  Concerns regarding medicines are outlined above.  Orders Placed This Encounter  Procedures   Basic metabolic panel   CBC   EKG 12-Lead   Meds ordered this encounter  Medications   clopidogrel (PLAVIX) 75 MG tablet    Sig: Take 1 tablet (75 mg total) by mouth daily.    Patient Instructions  Lab Work: CBC, BMET today If you have labs (blood work) drawn today and your tests are completely normal, you  will receive your results only by: MyChart Message (if you have MyChart) OR A paper copy in the mail If you have any lab test that is abnormal or we need to change your treatment, we will call you to review the results.  Testing/Procedures: Upcoming PFO  Follow-Up: At [facility], you and your health needs are our priority.  As part of our continuing mission to provide you with exceptional heart care, we have created designated Provider Care Teams.  These Care Teams include your primary Cardiologist (physician) and Advanced Practice Providers (APPs -  Physician Assistants and Nurse Practitioners) who all work together to provide you with the care you need, when you need it.  Your next appointment:   Structural Team will follow-up  Provider:   Alverda Skeans, MD, Georgie Chard, NP, or Carlean Jews, PA-C       Signed, Cline Crock, PA-C  02/20/2023 10:23 AM    [facility]

## 2023-02-20 ENCOUNTER — Ambulatory Visit: Payer: Managed Care, Other (non HMO) | Attending: Physician Assistant | Admitting: Physician Assistant

## 2023-02-20 VITALS — BP 122/84 | HR 73 | Resp 16 | Ht 66.0 in | Wt 253.0 lb

## 2023-02-20 DIAGNOSIS — M069 Rheumatoid arthritis, unspecified: Secondary | ICD-10-CM | POA: Diagnosis not present

## 2023-02-20 DIAGNOSIS — D500 Iron deficiency anemia secondary to blood loss (chronic): Secondary | ICD-10-CM | POA: Diagnosis not present

## 2023-02-20 DIAGNOSIS — I639 Cerebral infarction, unspecified: Secondary | ICD-10-CM

## 2023-02-20 DIAGNOSIS — N922 Excessive menstruation at puberty: Secondary | ICD-10-CM

## 2023-02-20 DIAGNOSIS — Q2112 Patent foramen ovale: Secondary | ICD-10-CM

## 2023-02-20 DIAGNOSIS — Z01810 Encounter for preprocedural cardiovascular examination: Secondary | ICD-10-CM

## 2023-02-20 MED ORDER — CLOPIDOGREL BISULFATE 75 MG PO TABS: 75.0000 mg | ORAL_TABLET | Freq: Every day | ORAL | Status: DC

## 2023-02-20 NOTE — Patient Instructions (Signed)
Lab Work: CBC, BMET today If you have labs (blood work) drawn today and your tests are completely normal, you will receive your results only by: MyChart Message (if you have MyChart) OR A paper copy in the mail If you have any lab test that is abnormal or we need to change your treatment, we will call you to review the results.  Testing/Procedures: Upcoming PFO  Follow-Up: At [facility], you and your health needs are our priority.  As part of our continuing mission to provide you with exceptional heart care, we have created designated Provider Care Teams.  These Care Teams include your primary Cardiologist (physician) and Advanced Practice Providers (APPs -  Physician Assistants and Nurse Practitioners) who all work together to provide you with the care you need, when you need it.  Your next appointment:   Structural Team will follow-up  Provider:   Alverda Skeans, MD, Georgie Chard, NP, or Carlean Jews, PA-C

## 2023-02-21 LAB — CBC
Hematocrit: 40.3 % (ref 34.0–46.6)
Hemoglobin: 12.4 g/dL (ref 11.1–15.9)
MCH: 24.6 pg — ABNORMAL LOW (ref 26.6–33.0)
MCHC: 30.8 g/dL — ABNORMAL LOW (ref 31.5–35.7)
MCV: 80 fL (ref 79–97)
Platelets: 339 10*3/uL (ref 150–450)
RBC: 5.05 x10E6/uL (ref 3.77–5.28)
RDW: 13 % (ref 11.7–15.4)
WBC: 11.8 10*3/uL — ABNORMAL HIGH (ref 3.4–10.8)

## 2023-02-21 LAB — BASIC METABOLIC PANEL
BUN/Creatinine Ratio: 15 (ref 9–23)
BUN: 13 mg/dL (ref 6–20)
CO2: 21 mmol/L (ref 20–29)
Calcium: 9.2 mg/dL (ref 8.7–10.2)
Chloride: 103 mmol/L (ref 96–106)
Creatinine, Ser: 0.86 mg/dL (ref 0.57–1.00)
Glucose: 83 mg/dL (ref 70–99)
Potassium: 4.8 mmol/L (ref 3.5–5.2)
Sodium: 139 mmol/L (ref 134–144)
eGFR: 89 mL/min/{1.73_m2} (ref >59)

## 2023-02-24 ENCOUNTER — Telehealth: Payer: Self-pay | Admitting: *Deleted

## 2023-02-24 NOTE — Telephone Encounter (Addendum)
PFO Closure  scheduled at [facility] for: Thursday February 26, 2023 7:30 AM Arrival time [facility] Main Entrance A at: 5:30 AM  Nothing to eat after midnight prior to procedure, clear liquids until 5 AM day of procedure.  Medication instructions: -Hold:  Plavix-none starting 02/23/23 until post procedure -Other usual morning medications can be taken with sips of water including aspirin 81 mg.  Plan to go home the same day, you will only stay overnight if medically necessary.  You must have responsible adult to drive you home.  Someone must be with you the first 24 hours after you arrive home.  Reviewed procedure instructions with patient.

## 2023-02-25 ENCOUNTER — Ambulatory Visit (HOSPITAL_BASED_OUTPATIENT_CLINIC_OR_DEPARTMENT_OTHER)
Admission: RE | Admit: 2023-02-25 | Discharge: 2023-02-25 | Disposition: A | Discharge status: Home / Self Care | Payer: Managed Care, Other (non HMO) | Source: Ambulatory Visit | Attending: Neurology | Admitting: Neurology

## 2023-02-25 ENCOUNTER — Ambulatory Visit (HOSPITAL_COMMUNITY)
Admission: RE | Admit: 2023-02-25 | Discharge: 2023-02-25 | Disposition: A | Discharge status: Home / Self Care | Payer: Managed Care, Other (non HMO) | Source: Ambulatory Visit | Attending: Neurology | Admitting: Neurology

## 2023-02-25 DIAGNOSIS — Q2112 Patent foramen ovale: Secondary | ICD-10-CM | POA: Insufficient documentation

## 2023-02-25 DIAGNOSIS — I6381 Other cerebral infarction due to occlusion or stenosis of small artery: Secondary | ICD-10-CM | POA: Diagnosis present

## 2023-02-25 NOTE — Progress Notes (Signed)
TCD with bubble injection completed. Please see CV Procedures for preliminary results.  Shona Simpson, RVT 02/25/23 2:11 PM

## 2023-02-25 NOTE — Progress Notes (Signed)
Lower extremity venous duplex completed. Please see CV Procedures for preliminary results.  Shona Simpson, RVT 02/25/23 2:14 PM

## 2023-02-26 ENCOUNTER — Ambulatory Visit (HOSPITAL_COMMUNITY)
Admission: RE | Admit: 2023-02-26 | Discharge: 2023-02-26 | Disposition: A | Discharge status: Home / Self Care | Payer: Managed Care, Other (non HMO) | Attending: Internal Medicine | Admitting: Internal Medicine

## 2023-02-26 ENCOUNTER — Other Ambulatory Visit: Payer: Self-pay

## 2023-02-26 ENCOUNTER — Encounter (HOSPITAL_COMMUNITY): Payer: Self-pay | Admitting: Internal Medicine

## 2023-02-26 ENCOUNTER — Ambulatory Visit (HOSPITAL_COMMUNITY)
Admission: RE | Disposition: A | Discharge status: Home / Self Care | Payer: Managed Care, Other (non HMO) | Source: Home / Self Care | Attending: Internal Medicine

## 2023-02-26 ENCOUNTER — Ambulatory Visit (HOSPITAL_COMMUNITY): Payer: Managed Care, Other (non HMO)

## 2023-02-26 DIAGNOSIS — N920 Excessive and frequent menstruation with regular cycle: Secondary | ICD-10-CM | POA: Diagnosis not present

## 2023-02-26 DIAGNOSIS — D500 Iron deficiency anemia secondary to blood loss (chronic): Secondary | ICD-10-CM | POA: Insufficient documentation

## 2023-02-26 DIAGNOSIS — F419 Anxiety disorder, unspecified: Secondary | ICD-10-CM | POA: Diagnosis not present

## 2023-02-26 DIAGNOSIS — Z79899 Other long term (current) drug therapy: Secondary | ICD-10-CM | POA: Diagnosis not present

## 2023-02-26 DIAGNOSIS — Z6841 Body Mass Index (BMI) 40.0 and over, adult: Secondary | ICD-10-CM | POA: Insufficient documentation

## 2023-02-26 DIAGNOSIS — F32A Depression, unspecified: Secondary | ICD-10-CM | POA: Insufficient documentation

## 2023-02-26 DIAGNOSIS — Z7982 Long term (current) use of aspirin: Secondary | ICD-10-CM | POA: Diagnosis not present

## 2023-02-26 DIAGNOSIS — Z7902 Long term (current) use of antithrombotics/antiplatelets: Secondary | ICD-10-CM | POA: Insufficient documentation

## 2023-02-26 DIAGNOSIS — Q211 Atrial septal defect, unspecified: Secondary | ICD-10-CM | POA: Diagnosis not present

## 2023-02-26 DIAGNOSIS — Z8673 Personal history of transient ischemic attack (TIA), and cerebral infarction without residual deficits: Secondary | ICD-10-CM | POA: Insufficient documentation

## 2023-02-26 DIAGNOSIS — Q2112 Patent foramen ovale: Secondary | ICD-10-CM | POA: Insufficient documentation

## 2023-02-26 DIAGNOSIS — I639 Cerebral infarction, unspecified: Secondary | ICD-10-CM | POA: Diagnosis not present

## 2023-02-26 DIAGNOSIS — M0600 Rheumatoid arthritis without rheumatoid factor, unspecified site: Secondary | ICD-10-CM | POA: Diagnosis not present

## 2023-02-26 HISTORY — PX: PATENT FORAMEN OVALE(PFO) CLOSURE: CATH118300

## 2023-02-26 LAB — ECHOCARDIOGRAM LIMITED
Height: 66 in
S' Lateral: 3.8 cm
Weight: 4048 [oz_av]

## 2023-02-26 LAB — POCT ACTIVATED CLOTTING TIME: Activated Clotting Time: 262 s

## 2023-02-26 LAB — PREGNANCY, URINE: Preg Test, Ur: NEGATIVE

## 2023-02-26 SURGERY — PATENT FORAMEN OVALE (PFO) CLOSURE
Anesthesia: LOCAL

## 2023-02-26 MED ORDER — SODIUM CHLORIDE 0.9 % IV SOLN: 250.0000 mL | INTRAVENOUS | Status: DC | PRN

## 2023-02-26 MED ORDER — VANCOMYCIN HCL IN DEXTROSE 1-5 GM/200ML-% IV SOLN
1000.0000 mg | INTRAVENOUS | Status: AC
  Administered 2023-02-26: 1000 mg via INTRAVENOUS
  Filled 2023-02-26: qty 200

## 2023-02-26 MED ORDER — ACETAMINOPHEN 325 MG PO TABS: 650.0000 mg | ORAL_TABLET | ORAL | Status: DC | PRN

## 2023-02-26 MED ORDER — HEPARIN SODIUM (PORCINE) 1000 UNIT/ML IJ SOLN
INTRAMUSCULAR | Status: AC
  Filled 2023-02-26: qty 10

## 2023-02-26 MED ORDER — ASPIRIN 81 MG PO CHEW: 81.0000 mg | CHEWABLE_TABLET | Freq: Every day | ORAL | Status: DC

## 2023-02-26 MED ORDER — SODIUM CHLORIDE 0.9 % WEIGHT BASED INFUSION: 1.0000 mL/kg/h | INTRAVENOUS | Status: DC

## 2023-02-26 MED ORDER — MIDAZOLAM HCL 2 MG/2ML IJ SOLN
INTRAMUSCULAR | Status: DC | PRN
  Administered 2023-02-26 (×2): 1 mg via INTRAVENOUS

## 2023-02-26 MED ORDER — LABETALOL HCL 5 MG/ML IV SOLN: 10.0000 mg | INTRAVENOUS | Status: DC | PRN

## 2023-02-26 MED ORDER — SODIUM CHLORIDE 0.9% FLUSH: 3.0000 mL | Freq: Two times a day (BID) | INTRAVENOUS | Status: DC

## 2023-02-26 MED ORDER — LIDOCAINE HCL (PF) 1 % IJ SOLN
INTRAMUSCULAR | Status: AC
  Filled 2023-02-26: qty 30

## 2023-02-26 MED ORDER — LIDOCAINE HCL (PF) 1 % IJ SOLN
INTRAMUSCULAR | Status: DC | PRN
  Administered 2023-02-26: 5 mL

## 2023-02-26 MED ORDER — ONDANSETRON HCL 4 MG/2ML IJ SOLN: 4.0000 mg | Freq: Four times a day (QID) | INTRAMUSCULAR | Status: DC | PRN

## 2023-02-26 MED ORDER — FENTANYL CITRATE (PF) 100 MCG/2ML IJ SOLN
INTRAMUSCULAR | Status: DC | PRN
  Administered 2023-02-26 (×2): 25 ug via INTRAVENOUS

## 2023-02-26 MED ORDER — HYDRALAZINE HCL 20 MG/ML IJ SOLN: 10.0000 mg | INTRAMUSCULAR | Status: DC | PRN

## 2023-02-26 MED ORDER — MIDAZOLAM HCL 2 MG/2ML IJ SOLN
INTRAMUSCULAR | Status: AC
Start: 2023-02-26 — End: ?
  Filled 2023-02-26: qty 2

## 2023-02-26 MED ORDER — SODIUM CHLORIDE 0.9 % WEIGHT BASED INFUSION
3.0000 mL/kg/h | INTRAVENOUS | Status: AC
  Administered 2023-02-26: 3 mL/kg/h via INTRAVENOUS

## 2023-02-26 MED ORDER — FENTANYL CITRATE (PF) 100 MCG/2ML IJ SOLN
INTRAMUSCULAR | Status: AC
  Filled 2023-02-26: qty 2

## 2023-02-26 MED ORDER — SODIUM CHLORIDE 0.9% FLUSH: 3.0000 mL | INTRAVENOUS | Status: DC | PRN

## 2023-02-26 MED ORDER — ASPIRIN 81 MG PO CHEW: 81.0000 mg | CHEWABLE_TABLET | ORAL | Status: DC

## 2023-02-26 MED ORDER — CLOPIDOGREL BISULFATE 75 MG PO TABS
75.0000 mg | ORAL_TABLET | Freq: Once | ORAL | Status: AC
  Administered 2023-02-26: 75 mg via ORAL
  Filled 2023-02-26: qty 1

## 2023-02-26 SURGICAL SUPPLY — 15 items
CATH ACUNAV 8FR 90CM (CATHETERS) IMPLANT
CATH EXPO 5F MPA-1 (CATHETERS) IMPLANT
CLOSURE PERCLOSE PROSTYLE (VASCULAR PRODUCTS) IMPLANT
COVER SWIFTLINK CONNECTOR (BAG) IMPLANT
GUIDEWIRE SAFE TJ AMPLATZ EXST (WIRE) IMPLANT
KIT SYRINGE INJ CVI SPIKEX1 (MISCELLANEOUS) IMPLANT
OCCLUDER CARDIOFORM SEPTAL 30 (Prosthesis & Implant Heart) IMPLANT
PACK CARDIAC CATHETERIZATION (CUSTOM PROCEDURE TRAY) ×2 IMPLANT
SET ATX-X65L (MISCELLANEOUS) IMPLANT
SHEATH FAST CATH 12F 12CM (SHEATH) IMPLANT
SHEATH INTROD W/O MIN 9FR 25CM (SHEATH) IMPLANT
SHEATH PINNACLE 8F 10CM (SHEATH) IMPLANT
SHEATH PROBE COVER 6X72 (BAG) IMPLANT
WIRE EMERALD 3MM-J .035X150CM (WIRE) IMPLANT
WIRE MICRO SET SILHO 5FR 7 (SHEATH) IMPLANT

## 2023-02-26 NOTE — Discharge Instructions (Addendum)
Continue plavix and aspirin for 6 months then stop plavix and continue aspirin indefinitely.   You will require antibiotics prior to any dental work, including cleanings, for 6 months after your PFO/ASD closure. This is to protect the device from potentially getting infected from bacteria in your mouth entering your bloodstream. The medication will be called into your pharmacy at your 1 month follow up appointment. If you require dental work before that time, please call the office and it will be called into your pharmacy on file. Please pick this up to have ready before any scheduled dental work. Instructions will be outlined on the bottle.   Groin Site Care Refer to this sheet in the next few weeks. These instructions provide you with information on caring for yourself after your procedure. Your caregiver may also give you more specific instructions. Your treatment has been planned according to current medical practices, but problems sometimes occur. Call your caregiver if you have any problems or questions after your procedure. HOME CARE INSTRUCTIONS You may shower 24 hours after the procedure. Remove the bandage (dressing) and gently wash the site with plain soap and water. Gently pat the site dry.  Do not apply powder or lotion to the site.  Do not sit in a bathtub, swimming pool, or whirlpool for 5 to 7 days.  No bending, squatting, or lifting anything over 10 pounds (4.5 kg) for 1 week Inspect the site at least twice daily.  Do not drive home if you are discharged the same day of the procedure. Have someone else drive you.  You may drive 24 hours after the procedure unless otherwise instructed by your caregiver.  What to expect: Any bruising will usually fade within 1 to 2 weeks.  Blood that collects in the tissue (hematoma) may be painful to the touch. It should usually decrease in size and tenderness within 1 to 2 weeks.  SEEK IMMEDIATE MEDICAL CARE IF: You have unusual pain at the groin  site or down the affected leg.  You have redness, warmth, swelling, or pain at the groin site.  You have drainage (other than a small amount of blood on the dressing).  You have chills.  You have a fever or persistent symptoms for more than 72 hours.  You have a fever and your symptoms suddenly get worse.  Your leg becomes pale, cool, tingly, or numb.  You have heavy bleeding from the site. Hold pressure on the site.

## 2023-02-26 NOTE — Interval H&P Note (Signed)
History and Physical Interval Note:  02/26/2023 6:54 AM  Laura [name]  has presented today for surgery, with the diagnosis of pfo.  The various methods of treatment have been discussed with the patient and family. After consideration of risks, benefits and other options for treatment, the patient has consented to  Procedure(s): PATENT FORAMEN OVALE(PFO) CLOSURE (N/A) as a surgical intervention.  The patient's history has been reviewed, patient examined, no change in status, stable for surgery.  I have reviewed the patient's chart and labs.  Questions were answered to the patient's satisfaction.     Orbie Pyo

## 2023-02-27 MED FILL — Heparin Sodium (Porcine) Inj 1000 Unit/ML: INTRAMUSCULAR | Qty: 20 | Status: AC

## 2023-03-02 ENCOUNTER — Encounter: Payer: Self-pay | Admitting: Nurse Practitioner

## 2023-03-03 ENCOUNTER — Encounter: Payer: Self-pay | Admitting: Internal Medicine

## 2023-03-03 MED ORDER — CLOPIDOGREL BISULFATE 75 MG PO TABS: 75.0000 mg | ORAL_TABLET | Freq: Every day | ORAL | 1 refills | Status: DC

## 2023-03-03 NOTE — Telephone Encounter (Signed)
Addressed in other advice request sent today also.

## 2023-03-21 ENCOUNTER — Emergency Department (HOSPITAL_COMMUNITY)
Admission: EM | Admit: 2023-03-21 | Discharge: 2023-03-21 | Disposition: A | Discharge status: Home / Self Care | Payer: Managed Care, Other (non HMO) | Attending: Emergency Medicine | Admitting: Emergency Medicine

## 2023-03-21 ENCOUNTER — Other Ambulatory Visit: Payer: Self-pay

## 2023-03-21 ENCOUNTER — Encounter (HOSPITAL_COMMUNITY): Payer: Self-pay | Admitting: Emergency Medicine

## 2023-03-21 ENCOUNTER — Emergency Department (HOSPITAL_COMMUNITY): Payer: Managed Care, Other (non HMO)

## 2023-03-21 ENCOUNTER — Ambulatory Visit
Admission: EM | Admit: 2023-03-21 | Discharge: 2023-03-21 | Disposition: A | Discharge status: Other Acute Inpatient Hospital | Payer: Managed Care, Other (non HMO) | Attending: Physician Assistant | Admitting: Physician Assistant

## 2023-03-21 ENCOUNTER — Other Ambulatory Visit (HOSPITAL_COMMUNITY): Payer: Self-pay

## 2023-03-21 ENCOUNTER — Encounter: Payer: Self-pay | Admitting: Hematology and Oncology

## 2023-03-21 DIAGNOSIS — I4891 Unspecified atrial fibrillation: Secondary | ICD-10-CM

## 2023-03-21 DIAGNOSIS — Z794 Long term (current) use of insulin: Secondary | ICD-10-CM | POA: Insufficient documentation

## 2023-03-21 DIAGNOSIS — Q2112 Patent foramen ovale: Secondary | ICD-10-CM | POA: Diagnosis not present

## 2023-03-21 DIAGNOSIS — Z7982 Long term (current) use of aspirin: Secondary | ICD-10-CM | POA: Insufficient documentation

## 2023-03-21 DIAGNOSIS — Z7901 Long term (current) use of anticoagulants: Secondary | ICD-10-CM | POA: Diagnosis not present

## 2023-03-21 DIAGNOSIS — R000 Tachycardia, unspecified: Secondary | ICD-10-CM | POA: Diagnosis present

## 2023-03-21 LAB — CBC
HCT: 39.7 % (ref 36.0–46.0)
Hemoglobin: 11.9 g/dL — ABNORMAL LOW (ref 12.0–15.0)
MCH: 23.5 pg — ABNORMAL LOW (ref 26.0–34.0)
MCHC: 30 g/dL (ref 30.0–36.0)
MCV: 78.5 fL — ABNORMAL LOW (ref 80.0–100.0)
Platelets: 448 10*3/uL — ABNORMAL HIGH (ref 150–400)
RBC: 5.06 MIL/uL (ref 3.87–5.11)
RDW: 14.3 % (ref 11.5–15.5)
WBC: 14.2 10*3/uL — ABNORMAL HIGH (ref 4.0–10.5)
nRBC: 0 % (ref 0.0–0.2)

## 2023-03-21 LAB — HCG, SERUM, QUALITATIVE: Preg, Serum: NEGATIVE

## 2023-03-21 LAB — BASIC METABOLIC PANEL
Anion gap: 10 (ref 5–15)
BUN: 16 mg/dL (ref 6–20)
CO2: 21 mmol/L — ABNORMAL LOW (ref 22–32)
Calcium: 8.8 mg/dL — ABNORMAL LOW (ref 8.9–10.3)
Chloride: 107 mmol/L (ref 98–111)
Creatinine, Ser: 1.24 mg/dL — ABNORMAL HIGH (ref 0.44–1.00)
GFR, Estimated: 57 mL/min — ABNORMAL LOW (ref >60)
Glucose, Bld: 111 mg/dL — ABNORMAL HIGH (ref 70–99)
Potassium: 4 mmol/L (ref 3.5–5.1)
Sodium: 138 mmol/L (ref 135–145)

## 2023-03-21 LAB — MAGNESIUM: Magnesium: 2.3 mg/dL (ref 1.7–2.4)

## 2023-03-21 LAB — TSH: TSH: 2.435 u[IU]/mL (ref 0.350–4.500)

## 2023-03-21 MED ORDER — APIXABAN 5 MG PO TABS
5.0000 mg | ORAL_TABLET | Freq: Two times a day (BID) | ORAL | 0 refills | Status: DC
  Filled 2023-03-21: qty 60, 30d supply, fill #0

## 2023-03-21 MED ORDER — PROPOFOL 10 MG/ML IV BOLUS
0.5000 mg/kg | Freq: Once | INTRAVENOUS | Status: DC
  Filled 2023-03-21: qty 20

## 2023-03-21 MED ORDER — PROPOFOL 10 MG/ML IV BOLUS
INTRAVENOUS | Status: AC | PRN
  Administered 2023-03-21: 29 mg via INTRAVENOUS
  Administered 2023-03-21: 30 mg via INTRAVENOUS

## 2023-03-21 MED ORDER — APIXABAN 5 MG PO TABS
5.0000 mg | ORAL_TABLET | Freq: Once | ORAL | Status: AC
  Administered 2023-03-21: 5 mg via ORAL
  Filled 2023-03-21: qty 1

## 2023-03-21 MED ORDER — APIXABAN 5 MG PO TABS: 5.0000 mg | ORAL_TABLET | Freq: Two times a day (BID) | ORAL | 0 refills | Status: DC

## 2023-03-21 NOTE — ED Provider Notes (Signed)
[facility]    CSN: [id number] Arrival date & time: 03/21/23  0806      History   Chief Complaint Chief Complaint  Patient presents with   Palpitations    HPI Laura [name] is a 37 y.o. female.   Patient here today for evaluation of palpitations that started around midnight.  She denies any shortness of breath or chest pain.  She does report some lightheadedness if she stands up fast.  She did recently have a PFO repair approximately 3 weeks ago.  She had a stroke prior to this year secondary to same.    The history is provided by the patient.  Palpitations Associated symptoms: no chest pain, no dizziness, no nausea, no shortness of breath and no vomiting     Past Medical History:  Diagnosis Date   Anemia    Anxiety    Anxiety 12/31/2018   Arthritis    Depression    Depression    Phreesia 02/25/2020   Depressive disorder 03/28/2014   Establishing care with new doctor, encounter for 07/30/2020   Flatulence, eructation and gas pain 07/30/2020   Frequent headaches    History of chicken pox    Otitis media    had tubes as child, then according to pt adipose tissue used to repair ear drum   Rheumatoid arthritis (HCC)    Stroke (HCC)    UTI (lower urinary tract infection)     Patient Active Problem List   Diagnosis Date Noted   PFO (patent foramen ovale) 02/09/2023   RBC abnormality 11/26/2022   Vision disturbance 11/05/2022   History of ischemic stroke in prior three months 11/04/2022   Hepatic steatosis 02/05/2021   Mixed hyperlipidemia 02/05/2021   Chronic midline thoracic back pain 07/30/2020   Seborrheic dermatitis of scalp 07/30/2020   Iron deficiency anemia 06/13/2020   Polycystic ovary syndrome 03/30/2019   GAD (generalized anxiety disorder) 12/16/2018   Prediabetes 12/16/2018   Vitamin D deficiency 12/16/2018   Obesity 06/19/2016   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   Rheumatoid arthritis (HCC)  04/29/2012    Past Surgical History:  Procedure Laterality Date   APPENDECTOMY  2012   HYMENECTOMY  2000   PATENT FORAMEN OVALE(PFO) CLOSURE N/A 02/26/2023   Procedure: PATENT FORAMEN OVALE(PFO) CLOSURE;  Surgeon: Orbie Pyo, MD;  Location: [facility] INVASIVE CV LAB;  Service: Cardiovascular;  Laterality: N/A;   TEE WITHOUT CARDIOVERSION N/A 11/07/2022   Procedure: TRANSESOPHAGEAL ECHOCARDIOGRAM;  Surgeon: Lewayne Bunting, MD;  Location: [facility] INVASIVE CV LAB;  Service: Cardiovascular;  Laterality: N/A;   TONSILECTOMY, ADENOIDECTOMY, BILATERAL MYRINGOTOMY AND TUBES      OB History   No obstetric history on file.      Home Medications    Prior to Admission medications   Medication Sig Start Date End Date Taking? Authorizing Provider  acetaminophen (TYLENOL) 500 MG tablet Take 500 mg by mouth as needed for moderate pain.    [provider]  aspirin 81 MG chewable tablet Chew 1 tablet (81 mg total) by mouth daily. 11/08/22   Burnadette Pop, MD  atorvastatin (LIPITOR) 40 MG tablet TAKE 1 TABLET(40 MG) BY MOUTH DAILY 12/30/22   Early, Sung Amabile, NP  clopidogrel (PLAVIX) 75 MG tablet Take 1 tablet (75 mg total) by mouth daily. 03/03/23   Janetta Hora, PA-C  escitalopram (LEXAPRO) 20 MG tablet TAKE 1 TABLET(20 MG) BY MOUTH AT BEDTIME 07/22/22   Early, Sung Amabile, NP  [facility]  PEN 40 MG/0.4ML PNKT Inject 40 mg as directed every 14 (fourteen) days. 07/19/20   [provider]  LORazepam (ATIVAN) 0.5 MG tablet Take 1 tablet (0.5 mg total) by mouth every 8 (eight) hours as needed for anxiety. 11/17/22   Tollie Eth, NP  ondansetron (ZOFRAN) 8 MG tablet Take 1 tablet (8 mg total) by mouth every 8 (eight) hours as needed for nausea. 07/22/22   EarlySung Amabile, NP    Family History Family History  Problem Relation Age of Onset   Sleep apnea Mother    Cancer Maternal Grandmother    Cancer Maternal Grandfather    Arthritis Paternal Grandmother    Liver cancer Paternal Grandfather      Social History Social History   Tobacco Use   Smoking status: Never   Smokeless tobacco: Never  Vaping Use   Vaping status: Never Used  Substance Use Topics   Alcohol use: Yes    Alcohol/week: 0.0 standard drinks of alcohol    Comment: occasionally    Drug use: No     Allergies   Amoxicillin, Kenalog [triamcinolone acetonide], and Tape   Review of Systems Review of Systems  Constitutional:  Negative for chills and fever.  Eyes:  Negative for discharge and redness.  Respiratory:  Negative for shortness of breath.   Cardiovascular:  Positive for palpitations. Negative for chest pain.  Gastrointestinal:  Negative for nausea and vomiting.  Neurological:  Positive for light-headedness. Negative for dizziness.     Physical Exam Triage Vital Signs ED Triage Vitals  Encounter Vitals Group     BP 03/21/23 0819 124/69     Systolic BP Percentile --      Diastolic BP Percentile --      Pulse Rate 03/21/23 0819 73     Resp 03/21/23 0819 18     Temp 03/21/23 0819 98 F (36.7 C)     Temp Source 03/21/23 0819 Oral     SpO2 03/21/23 0819 97 %     Weight 03/21/23 0821 253 lb 1.4 oz (114.8 kg)     Height 03/21/23 0821 5\' 6"  (1.676 m)     Head Circumference --      Peak Flow --      Pain Score 03/21/23 0821 0     Pain Loc --      Pain Education --      Exclude from Growth Chart --    No data found.  Updated Vital Signs BP 124/69 (BP Location: Left Arm)   Pulse (!) 185   Temp 98 F (36.7 C) (Oral)   Resp 18   Ht 5\' 6"  (1.676 m)   Wt 253 lb 1.4 oz (114.8 kg)   LMP 02/22/2023 (Exact Date)   SpO2 97%   BMI 40.85 kg/m   Visual Acuity Right Eye Distance:   Left Eye Distance:   Bilateral Distance:    Right Eye Near:   Left Eye Near:    Bilateral Near:     Physical Exam Vitals and nursing note reviewed.  Constitutional:      General: She is not in acute distress.    Appearance: Normal appearance. She is not ill-appearing.  HENT:     Head: Normocephalic and  atraumatic.  Eyes:     Conjunctiva/sclera: Conjunctivae normal.  Cardiovascular:     Rate and Rhythm: Tachycardia present. Rhythm irregular.  Pulmonary:     Effort: Pulmonary effort is normal. No respiratory distress.  Neurological:  Mental Status: She is alert.  Psychiatric:        Mood and Affect: Mood normal.        Behavior: Behavior normal.        Thought Content: Thought content normal.      UC Treatments / Results  Labs (all labs ordered are listed, but only abnormal results are displayed) Labs Reviewed - No data to display  EKG   Radiology No results found.  Procedures Procedures (including critical care time)  Medications Ordered in UC Medications - No data to display  Initial Impression / Assessment and Plan / UC Course  I have reviewed the triage vital signs and the nursing notes.  Pertinent labs & imaging results that were available during my care of the patient were reviewed by me and considered in my medical decision making (see chart for details).    Patient presents with new onset A-fib with RVR.  Patient remained stable in office.  IV started.  EMS to transport patient.  Final Clinical Impressions(s) / UC Diagnoses   Final diagnoses:  Atrial fibrillation with RVR [facility])   Discharge Instructions   None    ED Prescriptions   None    PDMP not reviewed this encounter.   Tomi Bamberger, PA-C 03/21/23 [phone]

## 2023-03-21 NOTE — Discharge Instructions (Addendum)
Hold your aspirin while on the Eliquis.  Continue the Plavix.

## 2023-03-21 NOTE — ED Provider Notes (Signed)
[place] EMERGENCY DEPARTMENT AT [facility] Provider Note   CSN: [id number] Arrival date & time: 03/21/23  5284     History  No chief complaint on file.   Laura [name] is a 37 y.o. female.  Pt is a 37 yo female with pmhx significant for depression, RA, CVA, HLD, and s/p PFO repair on 11/14 by Dr. Lynnette Caffey.  Pt noted around midnight last night that her heart was racing.  She did not sleep well last night and went to UC this am.  They found that she was in afib with RVR, so EMS was called.  She was given 20 mg cardizem IV by EMS without improvement in her afib.  She is on Plavix, but not on blood thinners.       Home Medications Prior to Admission medications   Medication Sig Start Date End Date Taking? Authorizing Provider  acetaminophen (TYLENOL) 500 MG tablet Take 500 mg by mouth as needed for moderate pain.    [provider]  apixaban (ELIQUIS) 5 MG TABS tablet Take 1 tablet (5 mg total) by mouth 2 (two) times daily. 03/21/23 04/20/23  Jacalyn Lefevre, MD  aspirin 81 MG chewable tablet Chew 1 tablet (81 mg total) by mouth daily. 11/08/22   Burnadette Pop, MD  atorvastatin (LIPITOR) 40 MG tablet TAKE 1 TABLET(40 MG) BY MOUTH DAILY 12/30/22   Early, Sung Amabile, NP  clopidogrel (PLAVIX) 75 MG tablet Take 1 tablet (75 mg total) by mouth daily. 03/03/23   Janetta Hora, PA-C  escitalopram (LEXAPRO) 20 MG tablet TAKE 1 TABLET(20 MG) BY MOUTH AT BEDTIME 07/22/22   Early, Sung Amabile, NP  HUMIRA PEN 40 MG/0.4ML PNKT Inject 40 mg as directed every 14 (fourteen) days. 07/19/20   [provider]  LORazepam (ATIVAN) 0.5 MG tablet Take 1 tablet (0.5 mg total) by mouth every 8 (eight) hours as needed for anxiety. 11/17/22   Tollie Eth, NP  ondansetron (ZOFRAN) 8 MG tablet Take 1 tablet (8 mg total) by mouth every 8 (eight) hours as needed for nausea. 07/22/22   Tollie Eth, NP      Allergies    Amoxicillin, Kenalog [triamcinolone acetonide], and Tape    Review  of Systems   Review of Systems  Cardiovascular:  Positive for palpitations.  All other systems reviewed and are negative.   Physical Exam Updated Vital Signs BP 96/70   Pulse 78   Temp 98.3 F (36.8 C) (Oral)   Resp 15   Ht 5\' 6"  (1.676 m)   Wt 117.9 kg   LMP 02/22/2023 (Exact Date)   SpO2 100%   BMI 41.97 kg/m  Physical Exam Vitals and nursing note reviewed.  Constitutional:      Appearance: Normal appearance. She is obese.  HENT:     Head: Normocephalic and atraumatic.     Right Ear: External ear normal.     Left Ear: External ear normal.     Nose: Nose normal.     Mouth/Throat:     Mouth: Mucous membranes are moist.     Pharynx: Oropharynx is clear.  Eyes:     Extraocular Movements: Extraocular movements intact.     Conjunctiva/sclera: Conjunctivae normal.     Pupils: Pupils are equal, round, and reactive to light.  Cardiovascular:     Rate and Rhythm: Tachycardia present. Rhythm irregular.     Pulses: Normal pulses.     Heart sounds: Normal heart sounds.  Pulmonary:  Effort: Pulmonary effort is normal.     Breath sounds: Normal breath sounds.  Abdominal:     General: Abdomen is flat. Bowel sounds are normal.     Palpations: Abdomen is soft.  Musculoskeletal:        General: Normal range of motion.     Cervical back: Normal range of motion and neck supple.  Skin:    General: Skin is warm.     Capillary Refill: Capillary refill takes less than 2 seconds.  Neurological:     General: No focal deficit present.     Mental Status: She is alert and oriented to person, place, and time.  Psychiatric:        Mood and Affect: Mood normal.        Behavior: Behavior normal.     ED Results / Procedures / Treatments   Labs (all labs ordered are listed, but only abnormal results are displayed) Labs Reviewed  BASIC METABOLIC PANEL - Abnormal; Notable for the following components:      Result Value   CO2 21 (*)    Glucose, Bld 111 (*)    Creatinine, Ser 1.24  (*)    Calcium 8.8 (*)    GFR, Estimated 57 (*)    All other components within normal limits  CBC - Abnormal; Notable for the following components:   WBC 14.2 (*)    Hemoglobin 11.9 (*)    MCV 78.5 (*)    MCH 23.5 (*)    Platelets 448 (*)    All other components within normal limits  MAGNESIUM  HCG, SERUM, QUALITATIVE  TSH    EKG EKG Interpretation Date/Time:  Saturday March 21 2023 09:35:42 EST Ventricular Rate:  163 PR Interval:    QRS Duration:  85 QT Interval:  291 QTC Calculation: 480 R Axis:   41  Text Interpretation: Atrial fibrillation Paired ventricular premature complexes Abnormal R-wave progression, early transition No significant change since last tracing Confirmed by Jacalyn Lefevre [phone]) on 03/21/2023 9:44:50 AM  Radiology DG Chest Port 1 View  Result Date: 03/21/2023 CLINICAL DATA:  Atrial fibrillation with rapid ventricular response. EXAM: PORTABLE CHEST 1 VIEW COMPARISON:  None Available. FINDINGS: 1103 hours. The heart size and mediastinal contours are normal. The lungs are clear. There is no pleural effusion or pneumothorax. No acute osseous findings are identified. External pacer and telemetry leads overlie the chest. IMPRESSION: No evidence of acute cardiopulmonary process. Electronically Signed   By: Carey Bullocks M.D.   On: 03/21/2023 11:22    Procedures .Cardioversion  Date/Time: 03/21/2023 10:35 AM  Performed by: Jacalyn Lefevre, MD Authorized by: Jacalyn Lefevre, MD   Consent:    Consent obtained:  Written   Consent given by:  Patient   Alternatives discussed:  No treatment Pre-procedure details:    Cardioversion basis:  Emergent   Rhythm:  Atrial fibrillation   Electrode placement:  Anterior-posterior Patient sedated: Yes. Refer to sedation procedure documentation for details of sedation.  Attempt one:    Cardioversion mode:  Synchronous   Shock (Joules):  200   Shock outcome:  Conversion to normal sinus rhythm Post-procedure  details:    Patient status:  Awake   Patient tolerance of procedure:  Tolerated well, no immediate complications .Sedation  Date/Time: 03/21/2023 11:26 AM  Performed by: Jacalyn Lefevre, MD Authorized by: Jacalyn Lefevre, MD   Consent:    Consent obtained:  Written   Consent given by:  Patient Universal protocol:    Immediately prior to procedure,  a time out was called: yes     Patient identity confirmed:  Verbally with patient Indications:    Procedure performed:  Cardioversion   Procedure necessitating sedation performed by:  Physician performing sedation Pre-sedation assessment:    Time since last food or drink:  5   ASA classification: class 2 - patient with mild systemic disease     Mallampati score:  II - soft palate, uvula, fauces visible   Pre-sedation assessments completed and reviewed: airway patency, cardiovascular function, hydration status, mental status, nausea/vomiting, pain level, respiratory function and temperature   A pre-sedation assessment was completed prior to the start of the procedure Immediate pre-procedure details:    Reassessment: Patient reassessed immediately prior to procedure     Verified: bag valve mask available, emergency equipment available, intubation equipment available, IV patency confirmed and oxygen available   Procedure details (see MAR for exact dosages):    Preoxygenation:  Room air   Sedation:  Propofol   Intended level of sedation: deep   Analgesia:  None   Intra-procedure monitoring:  Blood pressure monitoring, cardiac monitor, continuous capnometry, continuous pulse oximetry, frequent LOC assessments and frequent vital sign checks   Intra-procedure events: none     Total Provider sedation time (minutes):  30 Post-procedure details:   A post-sedation assessment was completed following the completion of the procedure.   Attendance: Constant attendance by certified staff until patient recovered     Recovery: Patient returned to  pre-procedure baseline     Post-sedation assessments completed and reviewed: airway patency, cardiovascular function, hydration status, mental status, nausea/vomiting, pain level, respiratory function and temperature     Patient is stable for discharge or admission: yes     Procedure completion:  Tolerated well, no immediate complications     Medications Ordered in ED Medications  propofol (DIPRIVAN) 10 mg/mL bolus/IV push 59 mg (59 mg Intravenous Not Given 03/21/23 1014)  propofol (DIPRIVAN) 10 mg/mL bolus/IV push (29 mg Intravenous Given 03/21/23 1005)  apixaban (ELIQUIS) tablet 5 mg (5 mg Oral Given 03/21/23 1139)    ED Course/ Medical Decision Making/ A&P                                 Medical Decision Making Amount and/or Complexity of Data Reviewed Labs: ordered. Radiology: ordered. ECG/medicine tests: ordered.  Risk Prescription drug management.   This patient presents to the ED for concern of palpitations, this involves an extensive number of treatment options, and is a complaint that carries with it a high risk of complications and morbidity.  The differential diagnosis includes afib with rvr, svt, vt   Co morbidities that complicate the patient evaluation  depression, RA, CVA, HLD, and s/p PFO repair   Additional history obtained:  Additional history obtained from epic chart review External records from outside source obtained and reviewed including EMS report   Lab Tests:  I Ordered, and personally interpreted labs.  The pertinent results include:  cbc with wbc elevated at 14.2, preg neg, tsh nl, bmp nl other than cr sl bumped at 1.24, mg nl   Imaging Studies ordered:  I ordered imaging studies including cxr  I independently visualized and interpreted imaging which showed No evidence of acute cardiopulmonary process.  I agree with the radiologist interpretation   Cardiac Monitoring:  The patient was maintained on a cardiac monitor.  I personally viewed  and interpreted the cardiac monitored which showed an underlying  rhythm of: afib initially; now NSR after cardioversion   Medicines ordered and prescription drug management:   I have reviewed the patients home medicines and have made adjustments as needed   Critical Interventions:  cardioversion   Consultations Obtained:  I requested consultation with the cardiologist (Dr. Eden Emms),  and discussed lab and imaging findings as well as pertinent plan - he recommends cardioversion and d/c with a month of eliquis.  Keep plavix and stop asa.   Problem List / ED Course:  Afib:  pt cardioverted.  She tolerated this well.  Pt will be d/c with eliquis.  She is given her 1st month's supply by the [facility] pharmacy.  She is stable for d/c.  She is to f/u with cards.   Reevaluation:  After the interventions noted above, I reevaluated the patient and found that they have :improved   Social Determinants of Health:  Lives at home   Dispostion:  After consideration of the diagnostic results and the patients response to treatment, I feel that the patent would benefit from discharge with outpatient f/u.  CRITICAL CARE Performed by: Jacalyn Lefevre   Total critical care time: 30 minutes  Critical care time was exclusive of separately billable procedures and treating other patients.  Critical care was necessary to treat or prevent imminent or life-threatening deterioration.  Critical care was time spent personally by me on the following activities: development of treatment plan with patient and/or surrogate as well as nursing, discussions with consultants, evaluation of patient's response to treatment, examination of patient, obtaining history from patient or surrogate, ordering and performing treatments and interventions, ordering and review of laboratory studies, ordering and review of radiographic studies, pulse oximetry and re-evaluation of patient's condition.           Final  Clinical Impression(s) / ED Diagnoses Final diagnoses:  Atrial fibrillation with rapid ventricular response (HCC)    Rx / DC Orders ED Discharge Orders          Ordered    Amb Referral to AFIB Clinic        03/21/23 1013    apixaban (ELIQUIS) 5 MG TABS tablet  2 times daily,   Status:  Discontinued        03/21/23 1042    apixaban (ELIQUIS) 5 MG TABS tablet  2 times daily        03/21/23 1120              Jacalyn Lefevre, MD 03/21/23 1142

## 2023-03-21 NOTE — ED Triage Notes (Signed)
Pt BIB [facility] from urgent care due to atrial fibrillation with RVR rate 150-190.  Pt did have stroke in this past summer; PFO was surgically closed last month.  Around midnight today 03/21/23 patient reported palpitations that did not go away.  [facility] gave 20mg  diltiazem; no change.  20 left AC.

## 2023-03-21 NOTE — ED Notes (Signed)
Both consents signed and at bedside

## 2023-03-21 NOTE — ED Notes (Signed)
Patient is being discharged from the Urgent Care and sent to the Emergency Department via EMS . Per RM, patient is in need of higher level of care due to new onset afib with RVR. Patient is aware and verbalizes understanding of plan of care.  Vitals:   03/21/23 0819  BP: 124/69  Pulse: 73  Resp: 18  Temp: 98 F (36.7 C)  SpO2: 97%

## 2023-03-21 NOTE — Progress Notes (Signed)
Eliquis picked up from [facility] and delivered to patient prior to discharge.  Education for eliquis provided at that time as well.  Delmar Landau, PharmD, BCPS 03/21/2023 11:51 AM ED Clinical Pharmacist -  [phone]

## 2023-03-21 NOTE — ED Notes (Addendum)
One month prescription given by Jonathon from [facility] pharmacy to patient.

## 2023-03-21 NOTE — ED Triage Notes (Signed)
"  I am having palpitations that started around midnight". No chest pain or sob. "I feel light headed if I get up fast and no dizziness". Fairly recent procedure (PFO). History of stroke due to PFO. No history of MI.

## 2023-03-23 ENCOUNTER — Telehealth (HOSPITAL_COMMUNITY): Payer: Self-pay

## 2023-03-23 NOTE — Telephone Encounter (Signed)
Contacted patient to schedule ED follow up appointment. Patient is scheduled on 12/12 @ 3:00 pm to see Tawny Hopping at the [facility]. Patient was given directions and details regarding her appointment. Communicated with patient and she verbalized understanding.

## 2023-03-26 ENCOUNTER — Ambulatory Visit (HOSPITAL_COMMUNITY)
Admission: RE | Admit: 2023-03-26 | Discharge: 2023-03-26 | Disposition: A | Discharge status: Home / Self Care | Payer: Managed Care, Other (non HMO) | Source: Ambulatory Visit | Attending: Internal Medicine | Admitting: Internal Medicine

## 2023-03-26 ENCOUNTER — Other Ambulatory Visit: Payer: Self-pay | Admitting: Nurse Practitioner

## 2023-03-26 VITALS — BP 122/78 | HR 78 | Ht 66.0 in | Wt 251.0 lb

## 2023-03-26 DIAGNOSIS — E785 Hyperlipidemia, unspecified: Secondary | ICD-10-CM | POA: Diagnosis present

## 2023-03-26 DIAGNOSIS — E669 Obesity, unspecified: Secondary | ICD-10-CM | POA: Diagnosis present

## 2023-03-26 DIAGNOSIS — Z7901 Long term (current) use of anticoagulants: Secondary | ICD-10-CM | POA: Insufficient documentation

## 2023-03-26 DIAGNOSIS — Z6841 Body Mass Index (BMI) 40.0 and over, adult: Secondary | ICD-10-CM | POA: Insufficient documentation

## 2023-03-26 DIAGNOSIS — Z7902 Long term (current) use of antithrombotics/antiplatelets: Secondary | ICD-10-CM | POA: Diagnosis not present

## 2023-03-26 DIAGNOSIS — I480 Paroxysmal atrial fibrillation: Secondary | ICD-10-CM | POA: Diagnosis not present

## 2023-03-26 DIAGNOSIS — D6869 Other thrombophilia: Secondary | ICD-10-CM

## 2023-03-26 DIAGNOSIS — Z8673 Personal history of transient ischemic attack (TIA), and cerebral infarction without residual deficits: Secondary | ICD-10-CM | POA: Insufficient documentation

## 2023-03-26 DIAGNOSIS — I4891 Unspecified atrial fibrillation: Secondary | ICD-10-CM | POA: Diagnosis not present

## 2023-03-26 DIAGNOSIS — M069 Rheumatoid arthritis, unspecified: Secondary | ICD-10-CM | POA: Insufficient documentation

## 2023-03-26 HISTORY — DX: Paroxysmal atrial fibrillation: I48.0

## 2023-03-26 MED ORDER — DILTIAZEM HCL 30 MG PO TABS: ORAL_TABLET | ORAL | 1 refills | Status: AC

## 2023-03-26 MED ORDER — APIXABAN 5 MG PO TABS: 5.0000 mg | ORAL_TABLET | Freq: Two times a day (BID) | ORAL | 11 refills | Status: DC

## 2023-03-26 NOTE — Progress Notes (Addendum)
Primary Care Physician: Tollie Eth, NP Primary Cardiologist: Orbie Pyo, MD Electrophysiologist: Maurice Small, MD     Referring Physician: ED     Laura [name] is a 37 y.o. female with a history of obesity, rheumatoid arthritis, HLD, CVA 10/2022, s/p PFO closure on 02/26/23 with Amplatzer 30 mm device, paroxysmal atrial fibrillation who presents for consultation in the [facility]. ED visit on 12/7 for palpitations found to be in new onset Afib with RVR. She underwent successful DCCV on 12/7. ED spoke with cardiology and recommended to continue plavix with Eliquis start; stop ASA. Patient is on Eliquis 5 mg BID for a CHADS2VASC score of 3.  On evaluation today, she is currently in NSR. She is currently on plavix 75 mg daily. No bleeding issues on Eliquis 5 mg BID. She has not had any episodes of Afib since ED visit. She is scheduled for a sleep study.   Today, she denies symptoms of chest pain, shortness of breath, orthopnea, PND, lower extremity edema, dizziness, presyncope, syncope, snoring, daytime somnolence, bleeding, or neurologic sequela. The patient is tolerating medications without difficulties and is otherwise without complaint today.    Atrial Fibrillation Risk Factors:  She is scheduled for sleep study.   she has a BMI of Body mass index is 40.51 kg/m.Marland Kitchen Filed Weights   03/26/23 1450  Weight: 113.9 kg    Current Outpatient Medications  Medication Sig Dispense Refill   acetaminophen (TYLENOL) 500 MG tablet Take 500 mg by mouth as needed for moderate pain.     atorvastatin (LIPITOR) 40 MG tablet TAKE 1 TABLET(40 MG) BY MOUTH DAILY 90 tablet 0   clopidogrel (PLAVIX) 75 MG tablet Take 1 tablet (75 mg total) by mouth daily. 90 tablet 1   diltiazem (CARDIZEM) 30 MG tablet Take 1 Tablet Every 4 Hours As Needed For HR >100 and TOP BP >100 45 tablet 1   escitalopram (LEXAPRO) 20 MG tablet TAKE 1 TABLET(20 MG) BY MOUTH AT BEDTIME  90 tablet 3   LORazepam (ATIVAN) 0.5 MG tablet Take 1 tablet (0.5 mg total) by mouth every 8 (eight) hours as needed for anxiety. 30 tablet 3   apixaban (ELIQUIS) 5 MG TABS tablet Take 1 tablet (5 mg total) by mouth 2 (two) times daily. 60 tablet 11   HUMIRA PEN 40 MG/0.4ML PNKT Inject 40 mg as directed every 14 (fourteen) days. (Patient not taking: Reported on 03/26/2023)     No current facility-administered medications for this encounter.    Atrial Fibrillation Management history:  Previous antiarrhythmic drugs: none Previous cardioversions: 03/21/23 Previous ablations: none Anticoagulation history: Eliquis 5 mg BID   ROS- All systems are reviewed and negative except as per the HPI above.  Physical Exam: BP 122/78   Pulse 78   Ht 5\' 6"  (1.676 m)   Wt 113.9 kg   LMP 02/22/2023 (Exact Date)   BMI 40.51 kg/m   GEN: Well nourished, well developed in no acute distress NECK: No JVD; No carotid bruits CARDIAC: Regular rate and rhythm, no murmurs, rubs, gallops RESPIRATORY:  Clear to auscultation without rales, wheezing or rhonchi  ABDOMEN: Soft, non-tender, non-distended EXTREMITIES:  No edema; No deformity   EKG today demonstrates  Vent. rate 78 BPM PR interval 140 ms QRS duration 86 ms QT/QTcB 374/426 ms P-R-T axes 33 50 46 Normal sinus rhythm Normal ECG When compared with ECG of 21-Mar-2023 10:08, PREVIOUS ECG IS PRESENT  Echo 02/26/23 demonstrated:   1.  Left ventricular ejection fraction, by estimation, is 55 to 60%. Left  ventricular ejection fraction by PLAX is 56 %. The left ventricle has  normal function.   2. Right ventricular systolic function is normal. The right ventricular  size is normal.   3. Amplatzer PFO closure device in place. There is no residual flow  across the interatrial septum.   4. The aortic valve is tricuspid.   5. The inferior vena cava is normal in size with greater than 50%  respiratory variability, suggesting right atrial pressure of 3  mmHg.   30-day event monitor 11/2022: Sinus rhythm HR 50=129 bpm, avg 72 A single patient triggered event appears to be an accidental push. <1% ectopy  ASSESSMENT & PLAN CHA2DS2-VASc Score = 3  The patient's score is based upon: CHF History: 0 HTN History: 0 Diabetes History: 0 Stroke History: 2 Vascular Disease History: 0 Age Score: 0 Gender Score: 1       ASSESSMENT AND PLAN: Paroxysmal Atrial Fibrillation (ICD10:  I48.0) The patient's CHA2DS2-VASc score is 3, indicating a 3.2% annual risk of stroke.   S/p PFO closure on 02/26/23.   She is currently in NSR. Education provided about Afib. Discussion about medication treatments and ablation going forward if indicated. After discussion, we will proceed with conservative observation at this time. Rhythm monitoring device recommended.   There is the question whether she had new onset Afib with RVR due to recent placement of PFO closure device. I would like her to continue follow up with EP. Due to her age, I would likely recommend ablation if she has recurrence of arrhythmia but unclear if possible due to device in atrial septum.  Diltiazem 30 mg PRN palpitations sent to pharmacy.   Secondary Hypercoagulable State (ICD10:  D68.69) The patient is at significant risk for stroke/thromboembolism based upon her CHA2DS2-VASc Score of 3.  Continue Apixaban (Eliquis).  Due to her risk score, current guideline recommendations to continue Eliquis 5 mg BID without interruption.     Follow up 3 months with EP.    Lake Bells, PA-C  Afib Clinic [facility] [address] [place], [place] [address] [phone]

## 2023-03-27 NOTE — Addendum Note (Signed)
Encounter addended by: Eustace Pen, PA-C on: 03/27/2023 8:59 AM  Actions taken: Clinical Note Signed

## 2023-03-27 NOTE — Progress Notes (Unsigned)
HEART [facility] VASCULAR CENTER   MULTIDISCIPLINARY HEART VALVE TEAM  Structural Heart Office Note:  .    Date:  03/30/2023  ID:  Laura [name], DOB 06-06-1985, MRN [id number] PCP: Laura Eth, NP  [facility] Providers Cardiologist:  Laura Pyo, MD  History of Present Illness: .   Laura [name] is a 37 y.o. female  with a hx of depression/anxiety, rheumatoid arthritis on Humira and Plaquenil, morbid obesity (BMI 40), iron deficiency anemia, menorrhagia, cryptogenic CVA and PFO who is s/p PFO closure 11/14 and presents today for follow up.   Laura [name] presented with stroke with symptoms of diplopia 10/2022. Head CT, CTA head and neck were negative however MRI showed bilateral thalamic infarction. Bubble study echocardiogram and TEE were positive for intracardiac shunt. Monitor was negative for atrial fibrillation and hypercoagulable labs were also negative.    She was seen by Dr. Lynnette [name] on 01/23/23 who felt like she was a good candidate for transcatheter PFO closure. She did have a recurrent episode of diplopia and was seen in the ER on 01/27/23 and seen in the ER. MRI was negative for acute changes and she was restarted on Plavix 75mg  daily.   She underwent successful ICE guided PFO closure with 30mm Gore Cardioform device. Post procedure echo showed no residual septal flow. Unfortunately she presented back to the ED 12/7 with palpitations found to be in new onset AF with RVR. She was treated with diltiazem and was started on Eliquis. In follow up with the AF Clinic she was in NSR with plans for conservative management although if recurrence recommendations would be further discussion regarding possible ablation in the further. She was prescribed diltiazem 30mg  PRN for palpitations.   Today she is here with her mom and is pretty anxious about her AF diagnosis. She reports only the one episode of AF and has had no recurrence since that time. She has been consistent  with Eliquis with no bleeding in stool or urine and has not needed PRN diltiazem. Discussed case with Dr. Lynnette [name] who prefers to keep patients on DOAC for at least three months then follow with a 30-day monitor to ensure no further AF before stopping anticoagulation. Discussed this with the patient. Otherwise she denies chest pain, SOB, palpitations, LE edema, orthopnea, PND, dizziness, or syncope. Denies bleeding in stool or urine.    Physical Exam:   VS:  BP 104/80   Pulse 80   Ht 5\' 6"  (1.676 m)   Wt 251 lb 6.4 oz (114 kg)   SpO2 96%   BMI 40.58 kg/m    Wt Readings from Last 3 Encounters:  03/30/23 251 lb 6.4 oz (114 kg)  03/26/23 251 lb (113.9 kg)  03/21/23 260 lb (117.9 kg)    General: Well developed, well nourished, NAD Lungs:Clear to ausculation bilaterally. No wheezes, rales, or rhonchi. Breathing is unlabored. Cardiovascular: RRR with S1 S2. No murmurs Extremities: No edema.  Neuro: Alert and oriented. No focal deficits. No facial asymmetry. MAE spontaneously. Psych: Responds to questions appropriately with normal affect.    Risk Assessment/Calculations:    CHA2DS2-VASc Score = 3   This indicates a 3.2% annual risk of stroke. The patient's score is based upon:      ASSESSMENT AND PLAN: .    Patent foramen ovale: s/p PFO closure with 30mm Gore Cardioform device. Post procedure echo showed no residual septal flow. Continue Plavix for 6 months post closure. She will also require dental SBE during  that time. This was discussed and RX sent for Azithromycin to preferred pharmacy. Plan 3 month telephone follow up as below. Otherwise, one year in person follow up with repeat limited echo with bubble.   New atrial fibrillation: Presented back to the ED 12/7 with palpitations found to be in new onset AF with RVR however with no recurrence. Continue Eliquis 5mg  BID. Plan to follow back with her at 3 months and if no further AF noted, plan 30-day monitor to confirm>>discuss with Dr.  Lynnette [name] and likely will stop DOAC at that time. Denies palpitations.   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 Has upcoming follow up with Laura [name].    Rheumatoid arthritis: Continue immunotherapy with Humira 40mg    I spent 25 minutes caring for this patient today including face-to-face discussions, ordering and reviewing labs, reviewing records from [facility] and other outside facilities, documenting in the record, and arranging for follow up.    Signed, Laura Chard, NP

## 2023-03-30 ENCOUNTER — Ambulatory Visit: Payer: Managed Care, Other (non HMO) | Attending: Cardiology | Admitting: Cardiology

## 2023-03-30 ENCOUNTER — Other Ambulatory Visit: Payer: Self-pay | Admitting: Cardiology

## 2023-03-30 VITALS — BP 104/80 | HR 80 | Ht 66.0 in | Wt 251.4 lb

## 2023-03-30 DIAGNOSIS — D6869 Other thrombophilia: Secondary | ICD-10-CM

## 2023-03-30 DIAGNOSIS — I639 Cerebral infarction, unspecified: Secondary | ICD-10-CM | POA: Diagnosis not present

## 2023-03-30 DIAGNOSIS — Q2112 Patent foramen ovale: Secondary | ICD-10-CM

## 2023-03-30 DIAGNOSIS — I480 Paroxysmal atrial fibrillation: Secondary | ICD-10-CM | POA: Diagnosis not present

## 2023-03-30 MED ORDER — AZITHROMYCIN 500 MG PO TABS: 500.0000 mg | ORAL_TABLET | ORAL | 0 refills | Status: DC

## 2023-03-30 NOTE — Patient Instructions (Addendum)
Medication Instructions:   START Azithromycin one (1) tablet by mouth ( 500 mg) 1 hour prior to dental procedure.  *If you need a refill on your cardiac medications before your next appointment, please call your pharmacy*   Lab Work:  None ordered.  If you have labs (blood work) drawn today and your tests are completely normal, you will receive your results only by: MyChart Message (if you have MyChart) OR A paper copy in the mail If you have any lab test that is abnormal or we need to change your treatment, we will call you to review the results.   Testing/Procedures:  Your physician has requested that you have an echocardiogram. Echocardiography is a painless test that uses sound waves to create images of your heart. It provides your doctor with information about the size and shape of your heart and how well your heart's chambers and valves are working. This procedure takes approximately one hour. There are no restrictions for this procedure. Please do NOT wear cologne, perfume, or lotions (deodorant is allowed). Please arrive 15 minutes prior to your appointment time.  Please note: We ask at that you not bring children with you during ultrasound (echo/ vascular) testing. Due to room size and safety concerns, children are not allowed in the ultrasound rooms during exams. Our front office staff cannot provide observation of children in our lobby area while testing is being conducted. An adult accompanying a patient to their appointment will only be allowed in the ultrasound room at the discretion of the ultrasound technician under special circumstances. We apologize for any inconvenience.    Follow-Up: At [facility], you and your health needs are our priority.  As part of our continuing mission to provide you with exceptional heart care, we have created designated Provider Care Teams.  These Care Teams include your primary Cardiologist (physician) and Advanced Practice  Providers (APPs -  Physician Assistants and Nurse Practitioners) who all work together to provide you with the care you need, when you need it.  We recommend signing up for the patient portal called "MyChart".  Sign up information is provided on this After Visit Summary.  MyChart is used to connect with patients for Virtual Visits (Telemedicine).  Patients are able to view lab/test results, encounter notes, upcoming appointments, etc.  Non-urgent messages can be sent to your provider as well.   To learn more about what you can do with MyChart, go to [email or web address].    Your next appointment:   Telephone appointment.   Provider:   Georgie Chard, NP    Christena Deem- Long Term Monitor Instructions  Your physician has requested you wear a ZIO patch monitor for 14 days.  This is a single patch monitor. [employer] supplies one patch monitor per enrollment. Additional stickers are not available. Please do not apply patch if you will be having a Nuclear Stress Test,  Echocardiogram, Cardiac CT, MRI, or Chest Xray during the period you would be wearing the  monitor. The patch cannot be worn during these tests. You cannot remove and re-apply the  ZIO XT patch monitor.  Your ZIO patch monitor will be mailed 3 day USPS to your address on file. It may take 3-5 days  to receive your monitor after you have been enrolled.  Once you have received your monitor, please review the enclosed instructions. Your monitor  has already been registered assigning a specific monitor serial # to you.  Billing and Patient Assistance Program Information  We have  supplied [employer] with any of your insurance information on file for billing purposes. [employer] offers a sliding scale Patient Assistance Program for patients that do not have  insurance, or whose insurance does not completely cover the cost of the ZIO monitor.  You must apply for the Patient Assistance Program to qualify for this discounted rate.  To apply,  please call [employer] at [phone], select option 4, select option 2, ask to apply for  Patient Assistance Program. Meredeth Ide will ask your household income, and how many people  are in your household. They will quote your out-of-pocket cost based on that information.  [employer] will also be able to set up a 64-month, interest-free payment plan if needed.  Applying the monitor   Shave hair from upper left chest.  Hold abrader disc by orange tab. Rub abrader in 40 strokes over the upper left chest as  indicated in your monitor instructions.  Clean area with 4 enclosed alcohol pads. Let dry.  Apply patch as indicated in monitor instructions. Patch will be placed under collarbone on left  side of chest with arrow pointing upward.  Rub patch adhesive wings for 2 minutes. Remove white label marked "1". Remove the white  label marked "2". Rub patch adhesive wings for 2 additional minutes.  While looking in a mirror, press and release button in center of patch. A small green light will  flash 3-4 times. This will be your only indicator that the monitor has been turned on.  Do not shower for the first 24 hours. You may shower after the first 24 hours.  Press the button if you feel a symptom. You will hear a small click. Record Date, Time and  Symptom in the Patient Logbook.  When you are ready to remove the patch, follow instructions on the last 2 pages of Patient  Logbook. Stick patch monitor onto the last page of Patient Logbook.  Place Patient Logbook in the blue and white box. Use locking tab on box and tape box closed  securely. The blue and white box has prepaid postage on it. Please place it in the mailbox as  soon as possible. Your physician should have your test results approximately 7 days after the  monitor has been mailed back to [facility]  Call [facility] Customer Care at [phone] if you have questions regarding  your ZIO XT patch monitor. Call them immediately if you see  an orange light blinking on your  monitor.  If your monitor falls off in less than 4 days, contact our Monitor department at [phone].  If your monitor becomes loose or falls off after 4 days call [employer] at [phone] for  suggestions on securing your monitor

## 2023-03-31 ENCOUNTER — Telehealth: Payer: Self-pay | Admitting: Hematology and Oncology

## 2023-03-31 NOTE — Telephone Encounter (Signed)
 Left patient a vm regarding upcoming appointment

## 2023-04-27 ENCOUNTER — Ambulatory Visit: Payer: Managed Care, Other (non HMO) | Admitting: Hematology and Oncology

## 2023-05-04 ENCOUNTER — Inpatient Hospital Stay: Payer: Managed Care, Other (non HMO) | Attending: Hematology and Oncology | Admitting: Hematology and Oncology

## 2023-05-04 ENCOUNTER — Inpatient Hospital Stay: Payer: Managed Care, Other (non HMO)

## 2023-05-04 ENCOUNTER — Encounter: Payer: Self-pay | Admitting: Hematology and Oncology

## 2023-05-04 VITALS — BP 136/84 | HR 85 | Temp 98.9°F | Resp 18 | Wt 257.4 lb

## 2023-05-04 DIAGNOSIS — Z809 Family history of malignant neoplasm, unspecified: Secondary | ICD-10-CM | POA: Diagnosis not present

## 2023-05-04 DIAGNOSIS — Z8261 Family history of arthritis: Secondary | ICD-10-CM | POA: Diagnosis not present

## 2023-05-04 DIAGNOSIS — F419 Anxiety disorder, unspecified: Secondary | ICD-10-CM | POA: Diagnosis not present

## 2023-05-04 DIAGNOSIS — Z79899 Other long term (current) drug therapy: Secondary | ICD-10-CM | POA: Diagnosis not present

## 2023-05-04 DIAGNOSIS — D500 Iron deficiency anemia secondary to blood loss (chronic): Secondary | ICD-10-CM | POA: Diagnosis not present

## 2023-05-04 DIAGNOSIS — Z87440 Personal history of urinary (tract) infections: Secondary | ICD-10-CM | POA: Diagnosis not present

## 2023-05-04 DIAGNOSIS — Z8673 Personal history of transient ischemic attack (TIA), and cerebral infarction without residual deficits: Secondary | ICD-10-CM | POA: Diagnosis not present

## 2023-05-04 DIAGNOSIS — F32A Depression, unspecified: Secondary | ICD-10-CM | POA: Diagnosis not present

## 2023-05-04 DIAGNOSIS — Z7901 Long term (current) use of anticoagulants: Secondary | ICD-10-CM | POA: Insufficient documentation

## 2023-05-04 DIAGNOSIS — Z880 Allergy status to penicillin: Secondary | ICD-10-CM | POA: Diagnosis not present

## 2023-05-04 DIAGNOSIS — Z9049 Acquired absence of other specified parts of digestive tract: Secondary | ICD-10-CM | POA: Insufficient documentation

## 2023-05-04 DIAGNOSIS — Z7902 Long term (current) use of antithrombotics/antiplatelets: Secondary | ICD-10-CM | POA: Insufficient documentation

## 2023-05-04 DIAGNOSIS — Z9089 Acquired absence of other organs: Secondary | ICD-10-CM | POA: Insufficient documentation

## 2023-05-04 DIAGNOSIS — Q2112 Patent foramen ovale: Secondary | ICD-10-CM | POA: Diagnosis not present

## 2023-05-04 DIAGNOSIS — M069 Rheumatoid arthritis, unspecified: Secondary | ICD-10-CM | POA: Diagnosis not present

## 2023-05-04 DIAGNOSIS — R5383 Other fatigue: Secondary | ICD-10-CM | POA: Insufficient documentation

## 2023-05-04 DIAGNOSIS — Z825 Family history of asthma and other chronic lower respiratory diseases: Secondary | ICD-10-CM | POA: Insufficient documentation

## 2023-05-04 DIAGNOSIS — D509 Iron deficiency anemia, unspecified: Secondary | ICD-10-CM | POA: Insufficient documentation

## 2023-05-04 DIAGNOSIS — I4891 Unspecified atrial fibrillation: Secondary | ICD-10-CM | POA: Insufficient documentation

## 2023-05-04 DIAGNOSIS — Z800 Family history of malignant neoplasm of digestive organs: Secondary | ICD-10-CM | POA: Diagnosis not present

## 2023-05-04 DIAGNOSIS — F5089 Other specified eating disorder: Secondary | ICD-10-CM | POA: Insufficient documentation

## 2023-05-04 LAB — COMPREHENSIVE METABOLIC PANEL
ALT: 14 U/L (ref 0–44)
AST: 12 U/L — ABNORMAL LOW (ref 15–41)
Albumin: 4.1 g/dL (ref 3.5–5.0)
Alkaline Phosphatase: 107 U/L (ref 38–126)
Anion gap: 6 (ref 5–15)
BUN: 11 mg/dL (ref 6–20)
CO2: 27 mmol/L (ref 22–32)
Calcium: 8.9 mg/dL (ref 8.9–10.3)
Chloride: 106 mmol/L (ref 98–111)
Creatinine, Ser: 0.81 mg/dL (ref 0.44–1.00)
GFR, Estimated: >60 mL/min (ref >60)
Glucose, Bld: 103 mg/dL — ABNORMAL HIGH (ref 70–99)
Potassium: 3.7 mmol/L (ref 3.5–5.1)
Sodium: 139 mmol/L (ref 135–145)
Total Bilirubin: 0.4 mg/dL (ref 0.0–1.2)
Total Protein: 7.4 g/dL (ref 6.5–8.1)

## 2023-05-04 LAB — IRON AND IRON BINDING CAPACITY (CC-WL,HP ONLY)
Iron: 30 ug/dL (ref 28–170)
Saturation Ratios: 6 % — ABNORMAL LOW (ref 10.4–31.8)
TIBC: 529 ug/dL — ABNORMAL HIGH (ref 250–450)
UIBC: 499 ug/dL — ABNORMAL HIGH (ref 148–442)

## 2023-05-04 LAB — CBC WITH DIFFERENTIAL/PLATELET
Abs Immature Granulocytes: 0.05 10*3/uL (ref 0.00–0.07)
Basophils Absolute: 0.1 10*3/uL (ref 0.0–0.1)
Basophils Relative: 1 %
Eosinophils Absolute: 0.2 10*3/uL (ref 0.0–0.5)
Eosinophils Relative: 2 %
HCT: 36.8 % (ref 36.0–46.0)
Hemoglobin: 10.9 g/dL — ABNORMAL LOW (ref 12.0–15.0)
Immature Granulocytes: 1 %
Lymphocytes Relative: 18 %
Lymphs Abs: 1.9 10*3/uL (ref 0.7–4.0)
MCH: 22.4 pg — ABNORMAL LOW (ref 26.0–34.0)
MCHC: 29.6 g/dL — ABNORMAL LOW (ref 30.0–36.0)
MCV: 75.6 fL — ABNORMAL LOW (ref 80.0–100.0)
Monocytes Absolute: 0.7 10*3/uL (ref 0.1–1.0)
Monocytes Relative: 6 %
Neutro Abs: 7.7 10*3/uL (ref 1.7–7.7)
Neutrophils Relative %: 72 %
Platelets: 377 10*3/uL (ref 150–400)
RBC: 4.87 MIL/uL (ref 3.87–5.11)
RDW: 15.3 % (ref 11.5–15.5)
WBC: 10.4 10*3/uL (ref 4.0–10.5)
nRBC: 0 % (ref 0.0–0.2)

## 2023-05-04 LAB — FERRITIN: Ferritin: 7 ng/mL — ABNORMAL LOW (ref 11–307)

## 2023-05-04 NOTE — Progress Notes (Signed)
[facility] CONSULT NOTE  Patient Care Team: Early, Sung Amabile, NP as PCP - General (Nurse Practitioner) Mealor, Roberts Gaudy, MD as PCP - Electrophysiology (Cardiology) Orbie Pyo, MD as PCP - Cardiology (Cardiology) Micki Riley, MD as PCP - Family Medicine (Neurology) Zenovia Jordan, MD as Consulting Physician (Rheumatology) Mitchel Honour, DO as Consulting Physician (Obstetrics and Gynecology) Adela Lank, Willaim Rayas, MD as Consulting Physician (Gastroenterology)  CHIEF COMPLAINTS/PURPOSE OF CONSULTATION:  IDA  ASSESSMENT & PLAN:   This is a pleasant 38 year old female patient with rheumatoid arthritis on hydroxychloroquine referred to hematology for evaluation and recommendations regarding iron deficiency  Cerebrovascular Accident (CVA) History of stroke in July 2024 with transient visual symptoms, resolved within 24 hours. PFO identified and closed in November 2024. Currently on Eliquis and Plavix for stroke prevention and post-procedure prophylaxis. -Continue Eliquis and Plavix. -Consider discontinuation of Eliquis pending further cardiac monitoring and consultation with cardiologist.  Atrial Fibrillation (AFib) Episode of AFib in December 2024, post-PFO closure, managed with cardioversion. No recurrence of symptoms since the episode. -Continue current management. -Plan for cardiac monitoring in March 2025 to assess for recurrent AFib.  Iron Deficiency Anemia Reports symptoms of fatigue and Pica (craving ice), suggestive of iron deficiency anemia. -Order complete blood count and iron panel. -Consider iron supplementation pending lab results. -Follow-up in 6 months or sooner if iron supplementation is needed.  Thank you for consulting [place] in the care of this patient please not hesitate to contact us with any questions or concerns.  HISTORY OF PRESENTING ILLNESS:   Laura [name] 38 y.o. female is here because of IDA.    This is a very pleasant  38 year old female patient who arrived to the appointment by herself for evaluation and recommendations for an iron deficiency anemia.   Discussed the use of AI scribe software for clinical note transcription with the patient, who gave verbal consent to proceed.  History of Present Illness        She experienced a stroke in July, which was characterized by visual disturbances that resolved within 24 hours.  During her hospital stay, a PFO was discovered and subsequently closed in November.  In December, the patient experienced an episode of atrial fibrillation (AFib), which required hospitalization and cardioversion. The surgeon believes this was a one-time event related to the PFO closure procedure. The patient has not experienced any similar symptoms since.  The patient is currently on two blood thinners, Eliquis and Plavix, due to the stroke and PFO closure.  The patient also reports feeling tired and has recently started craving ice, suggesting possible iron deficiency. She is on a blood thinner, which has made her periods more difficult. Rest of the pertinent 10 point ROS reviewed and neg.  MEDICAL HISTORY:  Past Medical History:  Diagnosis Date   Anemia    Anxiety    Anxiety 12/31/2018   Arthritis    Depression    Depression    Phreesia 02/25/2020   Depressive disorder 03/28/2014   Establishing care with new doctor, encounter for 07/30/2020   Flatulence, eructation and gas pain 07/30/2020   Frequent headaches    History of chicken pox    Otitis media    had tubes as child, then according to pt adipose tissue used to repair ear drum   Rheumatoid arthritis (HCC)    Stroke (HCC)    UTI (lower urinary tract infection)     SURGICAL HISTORY: Past Surgical History:  Procedure Laterality Date   APPENDECTOMY  2012   HYMENECTOMY  2000   PATENT FORAMEN OVALE(PFO) CLOSURE N/A 02/26/2023   Procedure: PATENT FORAMEN OVALE(PFO) CLOSURE;  Surgeon: Orbie Pyo, MD;  Location: [facility]  INVASIVE CV LAB;  Service: Cardiovascular;  Laterality: N/A;   TEE WITHOUT CARDIOVERSION N/A 11/07/2022   Procedure: TRANSESOPHAGEAL ECHOCARDIOGRAM;  Surgeon: Lewayne Bunting, MD;  Location: [facility] INVASIVE CV LAB;  Service: Cardiovascular;  Laterality: N/A;   TONSILECTOMY, ADENOIDECTOMY, BILATERAL MYRINGOTOMY AND TUBES      SOCIAL HISTORY: Social History   Socioeconomic History   Marital status: Single    Spouse name: Not on file   Number of children: 0   Years of education: Not on file   Highest education level: Not on file  Occupational History   Occupation: Corporate treasurer: [employer]  Tobacco Use   Smoking status: Never   Smokeless tobacco: Never  Vaping Use   Vaping status: Never Used  Substance and Sexual Activity   Alcohol use: Yes    Alcohol/week: 0.0 standard drinks of alcohol    Comment: occasionally    Drug use: No   Sexual activity: Not Currently    Birth control/protection: Pill  Other Topics Concern   Not on file  Social History Narrative   Work or School: Designer, 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       Social Drivers of Corporate investment banker Strain: Low Risk  (01/22/2023)   Overall Financial Resource Strain (CARDIA)    Difficulty of Paying Living Expenses: Not hard at all  Food Insecurity: No Food Insecurity (01/22/2023)   Hunger Vital Sign    Worried About Running Out of Food in the Last Year: Never true    Ran Out of Food in the Last Year: Never true  Transportation Needs: No Transportation Needs (01/22/2023)   PRAPARE - Administrator, Civil Service (Medical): No    Lack of Transportation (Non-Medical): No  Physical Activity: Sufficiently Active (01/22/2023)   Exercise Vital Sign    Days of Exercise per Week: 3 days    Minutes of Exercise per Session: 90 min  Stress: No Stress Concern Present  (01/22/2023)   [employer] of Occupational Health - Occupational Stress Questionnaire    Feeling of Stress : Only a little  Social Connections: Socially Isolated (01/22/2023)   Social Connection and Isolation Panel [NHANES]    Frequency of Communication with Friends and Family: Once a week    Frequency of Social Gatherings with Friends and Family: Once a week    Attends Religious Services: Never    Database administrator or Organizations: No    Attends Banker Meetings: Never    Marital Status: Never married  Intimate Partner Violence: Not At Risk (01/22/2023)   Humiliation, Afraid, Rape, and Kick questionnaire    Fear of Current or Ex-Partner: No    Emotionally Abused: No    Physically Abused: No    Sexually Abused: No    FAMILY HISTORY: Family History  Problem Relation Age of Onset   Sleep apnea Mother    Cancer Maternal Grandmother    Cancer Maternal Grandfather    Arthritis Paternal Grandmother    Liver cancer Paternal Grandfather     ALLERGIES:  is allergic to amoxicillin, kenalog [triamcinolone acetonide], and tape.  MEDICATIONS:  Current Outpatient Medications  Medication Sig Dispense Refill   acetaminophen (TYLENOL) 500 MG tablet Take 500 mg by mouth as needed for moderate pain.     apixaban (ELIQUIS) 5 MG TABS tablet Take 1 tablet (5 mg total) by mouth 2 (two) times daily. 60 tablet 11   atorvastatin (LIPITOR) 40 MG tablet TAKE 1 TABLET(40 MG) BY MOUTH DAILY 90 tablet 1   azithromycin (ZITHROMAX) 500 MG tablet Take 1 tablet (500 mg total) by mouth as directed. Take one (1) tablet by mouth ( 500 mg) 1 hour prior to dental procedure. 2 tablet 0   clopidogrel (PLAVIX) 75 MG tablet Take 1 tablet (75 mg total) by mouth daily. 90 tablet 1   diltiazem (CARDIZEM) 30 MG tablet Take 1 Tablet Every 4 Hours As Needed For HR >100 and TOP BP >100 45 tablet 1   escitalopram (LEXAPRO) 20 MG tablet TAKE 1 TABLET(20 MG) BY MOUTH AT BEDTIME 90 tablet 3   LORazepam  (ATIVAN) 0.5 MG tablet Take 1 tablet (0.5 mg total) by mouth every 8 (eight) hours as needed for anxiety. 30 tablet 3   No current facility-administered medications for this visit.     PHYSICAL EXAMINATION: ECOG PERFORMANCE STATUS: 1 - Symptomatic but completely ambulatory  Vitals:   05/04/23 1232  BP: 136/84  Pulse: 85  Resp: 18  Temp: 98.9 F (37.2 C)  SpO2: 100%     Filed Weights   05/04/23 1232  Weight: 257 lb 6.4 oz (116.8 kg)   GENERAL:alert, no distress and comfortable, obese. Neck: No palpable regional adenopathy. Chest: Clear to auscultation bilaterally Heart: Rate and rhythm regular Abdomen: Soft, nondistended, nontender No lower extremity edema  LABORATORY DATA:  I have reviewed the data as listed Lab Results  Component Value Date   WBC 14.2 (H) 03/21/2023   HGB 11.9 (L) 03/21/2023   HCT 39.7 03/21/2023   MCV 78.5 (L) 03/21/2023   PLT 448 (H) 03/21/2023     Chemistry      Component Value Date/Time   NA 138 03/21/2023 0946   NA 139 02/20/2023 0944   K 4.0 03/21/2023 0946   CL 107 03/21/2023 0946   CO2 21 (L) 03/21/2023 0946   BUN 16 03/21/2023 0946   BUN 13 02/20/2023 0944   CREATININE 1.24 (H) 03/21/2023 0946   CREATININE 0.85 04/15/2022 1330   CREATININE 0.80 04/29/2012 1724      Component Value Date/Time   CALCIUM 8.8 (L) 03/21/2023 0946   ALKPHOS 98 11/17/2022 1230   AST 17 11/17/2022 1230   AST 13 (L) 04/15/2022 1330   ALT 19 11/17/2022 1230   ALT 13 04/15/2022 1330   BILITOT 0.3 11/17/2022 1230   BILITOT 0.4 04/15/2022 1330       RADIOGRAPHIC STUDIES: I have personally reviewed the radiological images as listed and agreed with the findings in the report. No results found.  All questions were answered. The patient knows to call the clinic with any problems, questions or concerns. I spent a total of 30 minutes in the care of this patient including history and physical, review of records, counseling and coordination of care.      Rachel Moulds, MD 05/04/2023 12:47 PM

## 2023-05-05 ENCOUNTER — Other Ambulatory Visit: Payer: Self-pay | Admitting: Hematology and Oncology

## 2023-05-06 ENCOUNTER — Telehealth: Payer: Self-pay | Admitting: Hematology and Oncology

## 2023-05-06 NOTE — Telephone Encounter (Signed)
 Left patient a vm regarding upcoming appointment

## 2023-05-13 ENCOUNTER — Encounter: Payer: Self-pay | Admitting: General Practice

## 2023-05-20 ENCOUNTER — Inpatient Hospital Stay: Payer: Managed Care, Other (non HMO) | Attending: Hematology and Oncology

## 2023-05-20 ENCOUNTER — Encounter: Payer: Self-pay | Admitting: Hematology and Oncology

## 2023-05-20 VITALS — BP 114/51 | HR 67 | Temp 98.2°F | Resp 17

## 2023-05-20 DIAGNOSIS — Z79899 Other long term (current) drug therapy: Secondary | ICD-10-CM | POA: Insufficient documentation

## 2023-05-20 DIAGNOSIS — D509 Iron deficiency anemia, unspecified: Secondary | ICD-10-CM | POA: Diagnosis present

## 2023-05-20 DIAGNOSIS — Z8673 Personal history of transient ischemic attack (TIA), and cerebral infarction without residual deficits: Secondary | ICD-10-CM | POA: Diagnosis not present

## 2023-05-20 DIAGNOSIS — I4891 Unspecified atrial fibrillation: Secondary | ICD-10-CM | POA: Insufficient documentation

## 2023-05-20 MED ORDER — SODIUM CHLORIDE 0.9 % IV SOLN
INTRAVENOUS | Status: DC
Start: 2023-05-20 — End: 2023-05-20

## 2023-05-20 MED ORDER — SODIUM CHLORIDE 0.9 % IV SOLN
300.0000 mg | Freq: Once | INTRAVENOUS | Status: AC
  Administered 2023-05-20: 300 mg via INTRAVENOUS
  Filled 2023-05-20: qty 300

## 2023-05-20 NOTE — Progress Notes (Signed)
 No premeds ordered per treatment plan. Iron  infusion started. After about 10 minutes into the infusion, pt inquired about receiving premeds. Infusion paused. Melissa G from pharmacy contacted and she added premeds to the treatment plan if needed. Since infusion had already pharmacist stated we could give pt the option to proceed without adding premeds. Pt is agreeable with this plan.Will continue to monitor

## 2023-05-20 NOTE — Patient Instructions (Signed)

## 2023-05-20 NOTE — Progress Notes (Signed)
Pt observed for 30 min post venofer infusion. Tolerated well. No adverse s/s. VSS. Pt ambulated independently to lobby for d/c. AVS reviewed.

## 2023-05-27 ENCOUNTER — Inpatient Hospital Stay: Payer: Managed Care, Other (non HMO)

## 2023-05-27 VITALS — BP 110/70 | HR 66 | Temp 97.7°F | Resp 18

## 2023-05-27 DIAGNOSIS — D509 Iron deficiency anemia, unspecified: Secondary | ICD-10-CM

## 2023-05-27 MED ORDER — SODIUM CHLORIDE 0.9 % IV SOLN: INTRAVENOUS | Status: DC

## 2023-05-27 MED ORDER — SODIUM CHLORIDE 0.9 % IV SOLN
300.0000 mg | Freq: Once | INTRAVENOUS | Status: AC
  Administered 2023-05-27: 300 mg via INTRAVENOUS
  Filled 2023-05-27: qty 300

## 2023-05-27 MED ORDER — DIPHENHYDRAMINE HCL 50 MG/ML IJ SOLN
25.0000 mg | Freq: Once | INTRAMUSCULAR | Status: AC
  Administered 2023-05-27: 25 mg via INTRAVENOUS
  Filled 2023-05-27: qty 1

## 2023-05-27 MED ORDER — FAMOTIDINE IN NACL 20-0.9 MG/50ML-% IV SOLN
20.0000 mg | Freq: Once | INTRAVENOUS | Status: AC
  Administered 2023-05-27: 20 mg via INTRAVENOUS
  Filled 2023-05-27: qty 50

## 2023-06-01 ENCOUNTER — Telehealth: Payer: Self-pay | Admitting: Hematology and Oncology

## 2023-06-01 NOTE — Telephone Encounter (Signed)
Per Gaylyn Lambert request left patient a voicemail in regards to rescheduled infusion appointment times/dates; left call back number for scheduling if any concerns or conflicts with appointment times

## 2023-06-03 ENCOUNTER — Inpatient Hospital Stay: Payer: Managed Care, Other (non HMO)

## 2023-06-06 ENCOUNTER — Inpatient Hospital Stay: Payer: Managed Care, Other (non HMO)

## 2023-06-06 VITALS — BP 106/69 | HR 66

## 2023-06-06 DIAGNOSIS — D509 Iron deficiency anemia, unspecified: Secondary | ICD-10-CM | POA: Diagnosis not present

## 2023-06-06 MED ORDER — FAMOTIDINE IN NACL 20-0.9 MG/50ML-% IV SOLN
20.0000 mg | Freq: Once | INTRAVENOUS | Status: AC
  Administered 2023-06-06: 20 mg via INTRAVENOUS
  Filled 2023-06-06: qty 50

## 2023-06-06 MED ORDER — DIPHENHYDRAMINE HCL 50 MG/ML IJ SOLN
25.0000 mg | Freq: Once | INTRAMUSCULAR | Status: AC
Start: 2023-06-06 — End: 2023-06-06
  Administered 2023-06-06: 25 mg via INTRAVENOUS
  Filled 2023-06-06: qty 1

## 2023-06-06 MED ORDER — SODIUM CHLORIDE 0.9 % IV SOLN
300.0000 mg | Freq: Once | INTRAVENOUS | Status: AC
  Administered 2023-06-06: 300 mg via INTRAVENOUS
  Filled 2023-06-06: qty 300

## 2023-06-06 MED ORDER — SODIUM CHLORIDE 0.9 % IV SOLN: INTRAVENOUS | Status: DC

## 2023-06-10 ENCOUNTER — Encounter: Payer: Self-pay | Admitting: Cardiology

## 2023-06-10 ENCOUNTER — Ambulatory Visit: Payer: Managed Care, Other (non HMO) | Attending: Cardiology | Admitting: Cardiology

## 2023-06-10 ENCOUNTER — Encounter: Payer: Self-pay | Admitting: *Deleted

## 2023-06-10 VITALS — BP 122/78 | HR 75 | Ht 66.0 in | Wt 262.0 lb

## 2023-06-10 DIAGNOSIS — I480 Paroxysmal atrial fibrillation: Secondary | ICD-10-CM

## 2023-06-10 DIAGNOSIS — I639 Cerebral infarction, unspecified: Secondary | ICD-10-CM

## 2023-06-10 DIAGNOSIS — D6869 Other thrombophilia: Secondary | ICD-10-CM | POA: Diagnosis not present

## 2023-06-10 DIAGNOSIS — Q2112 Patent foramen ovale: Secondary | ICD-10-CM | POA: Diagnosis not present

## 2023-06-10 NOTE — Progress Notes (Signed)
 Patient enrolled for [employer]/ [employer] to ship a 30 day cardiac event monitor to her address on file.  Dr. Lynnette Caffey to read.

## 2023-06-10 NOTE — Patient Instructions (Signed)
 Medication Instructions:  Your physician recommends that you continue on your current medications as directed. Please refer to the Current Medication list given to you today.   *If you need a refill on your cardiac medications before your next appointment, please call your pharmacy*   Lab Work: None ordered  If you have labs (blood work) drawn today and your tests are completely normal, you will receive your results only by: MyChart Message (if you have MyChart) OR A paper copy in the mail If you have any lab test that is abnormal or we need to change your treatment, we will call you to review the results.   Testing/Procedures: Your physician has recommended that you wear an event monitor. Event monitors are medical devices that record the heart's electrical activity. Doctors most often [place] these monitors to diagnose arrhythmias. Arrhythmias are problems with the speed or rhythm of the heartbeat. The monitor is a small, portable device. You can wear one while you do your normal daily activities. This is usually used to diagnose what is causing palpitations/syncope (passing out).   [employer] Cardiac Event Monitor Instructions  Your physician has requested you wear your cardiac event monitor for 30 days. [employer] may call or text to confirm a shipping address. The monitor will be sent to a land address via UPS. [employer] will not ship a monitor to a PO BOX. It typically takes 3-5 days to receive your monitor after it has been enrolled. [employer] will assist with USPS tracking if your package is delayed. The telephone number for [employer] is [phone]. Once you have received your monitor, please review the enclosed instructions. Instruction tutorials can also be viewed under help and settings on the enclosed cell phone. Your monitor has already been registered assigning a specific monitor serial # to you.  Billing and Self Pay Discount Information  [employer] has been provided  the insurance information we had on file for you.  If your insurance has been updated, please call [employer] at [phone] to provide them with your updated insurance information.   [employer] offers a discounted Self Pay option for patients who have insurance that does not cover their cardiac event monitor or patients without insurance.  The discounted cost of a Self Pay Cardiac Event Monitor would be $225.00 , if the patient contacts [employer] at [phone] within 7 days of applying the monitor to make payment arrangements.  If the patient does not contact [employer] within 7 days of applying the monitor, the cost of the cardiac event monitor will be $350.00.  Applying the monitor  Remove cell phone from case and turn it on. The cell phone works as IT consultant and needs to be within [employer] of you at all times. The cell phone will need to be charged on a daily basis. We recommend you plug the cell phone into the enclosed charger at your bedside table every night.  Monitor batteries: You will receive two monitor batteries labelled #1 and #2. These are your recorders. Plug battery #2 onto the second connection on the enclosed charger. Keep one battery on the charger at all times. This will keep the monitor battery deactivated. It will also keep it fully charged for when you need to switch your monitor batteries. A small light will be blinking on the battery emblem when it is charging. The light on the battery emblem will remain on when the battery is fully charged.  Open package of a Monitor strip. Insert battery #1 into black hood on strip  and gently squeeze monitor battery onto connection as indicated in instruction booklet. Set aside while preparing skin.  Choose location for your strip, vertical or horizontal, as indicated in the instruction booklet. Shave to remove all hair from location. There cannot be any lotions, oils, powders, or colognes on skin where monitor is to  be applied. Wipe skin clean with enclosed Saline wipe. Dry skin completely.  Peel paper labeled #1 off the back of the Monitor strip exposing the adhesive. Place the monitor on the chest in the vertical or horizontal position shown in the instruction booklet. One arrow on the monitor strip must be pointing upward. Carefully remove paper labeled #2, attaching remainder of strip to your skin. Try not to create any folds or wrinkles in the strip as you apply it.  Firmly press and release the circle in the center of the monitor battery. You will hear a small beep. This is turning the monitor battery on. The heart emblem on the monitor battery will light up every 5 seconds if the monitor battery in turned on and connected to the patient securely. Do not push and hold the circle down as this turns the monitor battery off. The cell phone will locate the monitor battery. A screen will appear on the cell phone checking the connection of your monitor strip. This may read poor connection initially but change to good connection within the next minute. Once your monitor accepts the connection you will hear a series of 3 beeps followed by a climbing crescendo of beeps. A screen will appear on the cell phone showing the two monitor strip placement options. Touch the picture that demonstrates where you applied the monitor strip.  Your monitor strip and battery are waterproof. You are able to shower, bathe, or swim with the monitor on. They just ask you do not submerge deeper than 3 feet underwater. We recommend removing the monitor if you are swimming in a lake, river, or ocean.  Your monitor battery will need to be switched to a fully charged monitor battery approximately once a week. The cell phone will alert you of an action which needs to be made.  On the cell phone, tap for details to reveal connection status, monitor battery status, and cell phone battery status. The green dots indicates your monitor  is in good status. A red dot indicates there is something that needs your attention.  To record a symptom, click the circle on the monitor battery. In 30-60 seconds a list of symptoms will appear on the cell phone. Select your symptom and tap save. Your monitor will record a sustained or significant arrhythmia regardless of you clicking the button. Some patients do not feel the heart rhythm irregularities. [employer] will notify us of any serious or critical events.  Refer to instruction booklet for instructions on switching batteries, changing strips, the Do not disturb or Pause features, or any additional questions.  Call [employer] at [phone], to confirm your monitor is transmitting and record your baseline. They will answer any questions you may have regarding the monitor instructions at that time.  Returning the monitor to [employer]  Place all equipment back into blue box. Peel off strip of paper to expose adhesive and close box securely. There is a prepaid UPS shipping label on this box. Drop in a UPS drop box, or at a UPS facility like [employer]. You may also contact [employer] to arrange UPS to pick up monitor package at your home.    Follow-Up: At [facility], you and your health needs are our priority.  As part of our continuing mission to provide you with exceptional heart care, we have created designated Provider Care Teams.  These Care Teams include your primary Cardiologist (physician) and Advanced Practice Providers (APPs -  Physician Assistants and Nurse Practitioners) who all work together to provide you with the care you need, when you need it.  We recommend signing up for the patient portal called "MyChart".  Sign up information is provided on this After Visit Summary.  MyChart is used to connect with patients for Virtual Visits (Telemedicine).  Patients are able to view lab/test results, encounter notes, upcoming appointments, etc.  Non-urgent messages can  be sent to your provider as well.   To learn more about what you can do with MyChart, go to [email or web address].    Your next appointment:   As scheduled  Provider:   Orbie Pyo, MD     Other Instructions     1st Floor: - Lobby - Registration  - Pharmacy  - Lab - Cafe  2nd Floor: - PV Lab - Diagnostic Testing (echo, CT, nuclear med)  3rd Floor: - Vacant  4th Floor: - TCTS (cardiothoracic surgery) - AFib Clinic - Structural Heart Clinic - Vascular Surgery  - Vascular Ultrasound  5th Floor: - HeartCare Cardiology (general and EP) - Clinical Pharmacy for coumadin, hypertension, lipid, weight-loss medications, and med management appointments    Valet parking services will be available as well.

## 2023-06-10 NOTE — Progress Notes (Signed)
 [facility]   MULTIDISCIPLINARY HEART VALVE TEAM  Structural Heart Office Note:  .    Date:  06/10/2023  ID:  Laura [name], DOB [birth year], MRN [id number] PCP: Tollie Eth, NP  [facility] Providers Cardiologist:  Orbie Pyo, MD  History of Present Illness: .   Laura [name] is a 38 y.o. female with a hx of depression/anxiety, rheumatoid arthritis on Humira and Plaquenil, morbid obesity (BMI 40), iron deficiency anemia, menorrhagia, cryptogenic CVA and PFO who is s/p PFO closure 11/14 and presents today for follow up.   Laura [name] presented with stroke with symptoms of diplopia 10/2022. Head CT, CTA head and neck were negative however MRI showed bilateral thalamic infarction. Bubble study echocardiogram and TEE were positive for intracardiac shunt. Monitor was negative for atrial fibrillation and hypercoagulable labs were also negative. She was seen by Dr. Lynnette Caffey on 01/23/23 who felt like she was a good candidate for transcatheter PFO closure. She did have a recurrent episode of diplopia and was seen in the ER on 01/27/23 and seen in the ER. MRI was negative for acute changes and she was restarted on Plavix 75mg  daily.    She underwent successful ICE guided PFO closure with 30mm Gore Cardioform device. Post procedure echo showed no residual septal flow. Unfortunately she presented back to the ED 12/7 with palpitations found to be in new onset AF with RVR. She was treated with diltiazem and was started on Eliquis. In follow up with the AF Clinic she was in NSR with plans for conservative management although if recurrence recommendations would be further discussion regarding possible ablation in the further.    In follow up with myself, she had concerns about being on blood thinners. Discussed case with Dr. Lynnette Caffey who preferred to keep the patient on Eliquis for at least three months then follow with a 30-day monitor to ensure no further AF before  stopping anticoagulation. Today she is here alone and she continues to do very well with no issues with chest pain, SOB, palpitations, LE edema, orthopnea, PND, dizziness, or syncope. Denies bleeding in stool or urine. She follows with hematology for a long hx of iron deficiency anemia with iron infusions and has had to have these more frequently with being on both Plavix and Eliquis.   Physical Exam:   VS:  BP 122/78   Pulse 75   Ht 5\' 6"  (1.676 m)   Wt 262 lb (118.8 kg)   SpO2 97%   BMI 42.29 kg/m    Wt Readings from Last 3 Encounters:  06/10/23 262 lb (118.8 kg)  05/04/23 257 lb 6.4 oz (116.8 kg)  03/30/23 251 lb 6.4 oz (114 kg)   General: Well developed, well nourished, NAD Lungs:Clear to ausculation bilaterally. No wheezes, rales, or rhonchi. Breathing is unlabored. Cardiovascular: RRR with S1 S2. No murmurs Extremities: No edema. Neuro: Alert and oriented. No focal deficits. No facial asymmetry. MAE spontaneously. Psych: Responds to questions appropriately with normal affect.    ASSESSMENT AND PLAN: .    Brief PAF: Noted after PFO closure with initial dx on 03/21/23 treated with PRN diltiazem and Eliquis. Reports no recurrence of PAF. Plan to continue Eliquis through 06/28/23 and place 30-day monitor. If no recurrence of PAF, plan to stop Eliquis.   PFO: s/p PFO closure with 30mm Gore Cardioform device. Post procedure echo showed no residual septal flow. Continue Plavix for 6 months post closure. She will also require dental SBE with  Azithromycin through 6 months then no longer indicated.  Plan one year follow up with limited echo with bubble 02/2024.    I spent 25 minutes caring for this patient today including face-to-face discussions, ordering and reviewing labs, reviewing records from [facility] and other outside facilities, documenting in the record, and arranging for follow up.    Signed, Georgie Chard, NP

## 2023-06-22 ENCOUNTER — Telehealth: Payer: Managed Care, Other (non HMO)

## 2023-07-03 ENCOUNTER — Other Ambulatory Visit: Payer: Self-pay | Admitting: Nurse Practitioner

## 2023-07-03 DIAGNOSIS — F411 Generalized anxiety disorder: Secondary | ICD-10-CM

## 2023-07-03 DIAGNOSIS — F330 Major depressive disorder, recurrent, mild: Secondary | ICD-10-CM

## 2023-07-03 NOTE — Telephone Encounter (Signed)
 Last apt 10.10.24

## 2023-07-21 ENCOUNTER — Ambulatory Visit: Attending: Internal Medicine

## 2023-07-21 ENCOUNTER — Encounter: Payer: Self-pay | Admitting: Internal Medicine

## 2023-07-21 DIAGNOSIS — I480 Paroxysmal atrial fibrillation: Secondary | ICD-10-CM

## 2023-08-18 ENCOUNTER — Other Ambulatory Visit: Payer: Self-pay

## 2023-08-19 ENCOUNTER — Encounter: Payer: Self-pay | Admitting: Hematology and Oncology

## 2023-08-19 ENCOUNTER — Telehealth: Payer: Self-pay | Admitting: *Deleted

## 2023-08-19 NOTE — Telephone Encounter (Signed)
 This RN spoke with pt per her My Chart message-   Discussed change in flow with menstrual cycle with being on eliquis  and plavix  - which does not always equate to increased heme loss-.  Informed her we can recheck her labs for reassurance and need for IV iron .  She states she is going to her neurologist next week and will ask if he can draw needed labs - this RN informed her if he is not comfortable ordering - let this office know.  Pt stated appreciation of discussion- and will call if needed.

## 2023-08-20 MED ORDER — ASPIRIN 81 MG PO TBEC: 81.0000 mg | DELAYED_RELEASE_TABLET | Freq: Every day | ORAL | Status: AC

## 2023-08-20 MED ORDER — CLOPIDOGREL BISULFATE 75 MG PO TABS: 75.0000 mg | ORAL_TABLET | Freq: Every day | ORAL | 0 refills | Status: AC

## 2023-08-20 NOTE — Telephone Encounter (Signed)
 RE: Message about your results Received: Noretta Bears, Arun K, MD  Vilme, Britany, RN; Lowell Rude, RN Please stop Eliquis  and start ASA 81mg .  She will take ASA 81 and her plavix  75 (that she is already on) for 1 more month, then stop plavix  and stay on ASA 81mg  indefinitely. ___________________________________________________________   Left message to call back for med changes or check the portal for important information.

## 2023-08-25 ENCOUNTER — Ambulatory Visit: Payer: Managed Care, Other (non HMO) | Admitting: Neurology

## 2023-08-25 VITALS — BP 140/87 | HR 69 | Ht 66.0 in | Wt 267.0 lb

## 2023-08-25 DIAGNOSIS — Q2112 Patent foramen ovale: Secondary | ICD-10-CM | POA: Diagnosis not present

## 2023-08-25 DIAGNOSIS — Z9189 Other specified personal risk factors, not elsewhere classified: Secondary | ICD-10-CM

## 2023-08-25 DIAGNOSIS — E7849 Other hyperlipidemia: Secondary | ICD-10-CM

## 2023-08-25 DIAGNOSIS — I639 Cerebral infarction, unspecified: Secondary | ICD-10-CM | POA: Diagnosis not present

## 2023-08-25 NOTE — Progress Notes (Signed)
 [facility] [address] [place]. [place] [address] [phone]       OFFICE FOLLOW-UP VISIT NOTE  [name] Date of Birth:  October 25, 1985 Medical Record Number:  [id number]   Referring MD:  Leona Rake  Reason for Referral: Stroke  HPI: Initial visit 01/15/2023  Laura [name] is a pleasant 38 year old Caucasian lady seen today for initial office consultation visit for stroke.  History is obtained from the Laura [name] and review of electronic medical records.  I personally reviewed pertinent available imaging films in PACS.  She has past medical history only of anxiety and arthralgia and anemia.  She presented on 11/04/2022 with sudden onset of binocular vertical diplopia.  She denied any accompanying headache, gait, balance or speech difficulties.  She had just gotten up in the morning and use the restroom and as she tried to get up from the toilet seat she did not notice vertical diplopia which improved when she closed her eyes.  She was seen as a code stroke at [facility], ER but felt to have too mild symptoms to be treated with thrombolysis.  CT head was unremarkable and CT angiogram showed no large vessel stenosis or occlusion.  CT perfusion was also negative for acute infarct.  MRI scan was obtained which confirmed a small right medial and possibly tiny left medial thalamic infarct.  Laura [name] was admitted and underwent an extensive workup for cryptogenic source.  2D echo showed ejection fraction of 60 to 65%.  Left atrium size was normal.  Bubble study was positive within 3-6 cardiac cycles for right-to-left shunt.  TEE was negative for cardiac source of embolism but did confirm the right-to-left shunt.  Laura [name] underwent 30-day outpatient cardiac monitoring which showed no evidence of paroxysmal A-fib.  Lower extremity venous Dopplers were negative for DVT lipid profile shows of showed LDL-cholesterol to be elevated at 139.  Hemoglobin A1c was 5.5.  Hypercoagulable  panel lab was mostly negative except for slight elevation of IgA beta-2  glycoprotein.  Urine drug screen was negative.  Laura [name] was started on aspirin  and Plavix  as well as Lipitor.  She states she is done well.  She has had no recurrent diplopia or any other focal stroke or TIA symptoms.  She is tolerating aspirin  Plavix  well without bruising or bleeding.  She is tolerating Lipitor well without muscle aches.  She states she did have follow-up lipid profile on 12/13/2022 but does not have the results to share with me today.  She denies any history of deep vein thrombosis, pulmonary embolism, migraines or vision disturbances.  She denies any history of rashes however she does have pets which epileptics the Laura [name] has never had a tick bite or rash. Update 08/25/2023: She returns for follow-up after last visit 6 months ago.  She did undergo echo endovascular PFO closure on 02/26/2023 by Dr. Caffie Castilla.  She developed postprocedure A-fib and required cardioversion.  She has been on Eliquis  since then.  She recently wore a 30-day heart monitor last month which did not show any recurrence of A-fib and plans to stop Eliquis  today and go on aspirin  and Plavix .  She does complain of increased heavy menstrual bleeding and she does have a history of low iron  and wants me to check lab work for the same.  She denies any recurrent stroke or TIA symptoms.  She is made a full recovery from a stroke.  She had no neurological complaints.  She has returned back to working full-time at [employer].  She  is tolerating Lipitor well without significant side effects.  Last lipid profile on 01/15/2023 had shown LDL cholesterol to be borderline at 67 mg percent.  Repeat beta-2  glycoprotein IgA was yet elevated at 41 though the previously elevated IgG G was on normal this is of unclear significance.  Previously anticardiolipin antibodies and lupus anticoagulant were negative.  She denies any known history of DVT  or pulmonary embolism in herself or  her family. ROS:   14 system review of systems is positive for double vision only all other systems negative  PMH:  Past Medical History:  Diagnosis Date   Anemia    Anxiety    Anxiety 12/31/2018   Arthritis    Depression    Depression    Phreesia 02/25/2020   Depressive disorder 03/28/2014   Establishing care with new doctor, encounter for 07/30/2020   Flatulence, eructation and gas pain 07/30/2020   Frequent headaches    History of chicken pox    Otitis media    had tubes as child, then according to pt adipose tissue used to repair ear drum   Rheumatoid arthritis (HCC)    Stroke (HCC)    UTI (lower urinary tract infection)     Social History:  Social History   Socioeconomic History   Marital status: Single    Spouse name: Not on file   Number of children: 0   Years of education: Not on file   Highest education level: Not on file  Occupational History   Occupation: Corporate treasurer: [employer]  Tobacco Use   Smoking status: Never   Smokeless tobacco: Never  Vaping Use   Vaping status: Never Used  Substance and Sexual Activity   Alcohol use: Yes    Alcohol/week: 0.0 standard drinks of alcohol    Comment: occasionally    Drug use: No   Sexual activity: Not Currently    Birth control/protection: Pill  Other Topics Concern   Not on file  Social History Narrative   Work or School: Designer, 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       Social Drivers of Corporate investment banker Strain: Low Risk  (01/22/2023)   Overall Financial Resource Strain (CARDIA)    Difficulty of Paying Living Expenses: Not hard at all  Food Insecurity: No Food Insecurity (01/22/2023)   Hunger Vital Sign    Worried About Running Out of Food in the Last Year: Never true    Ran Out of Food in the Last Year: Never true  Transportation Needs: No  Transportation Needs (01/22/2023)   PRAPARE - Administrator, Civil Service (Medical): No    Lack of Transportation (Non-Medical): No  Physical Activity: Sufficiently Active (01/22/2023)   Exercise Vital Sign    Days of Exercise per Week: 3 days    Minutes of Exercise per Session: 90 min  Stress: No Stress Concern Present (01/22/2023)   [employer] of Occupational Health - Occupational Stress Questionnaire    Feeling of Stress : Only a little  Social Connections: Socially Isolated (01/22/2023)   Social Connection and Isolation Panel [NHANES]    Frequency of Communication with Friends and Family: Once a week    Frequency of Social Gatherings with Friends and Family: Once a week    Attends Religious Services: Never    Production manager of  Clubs or Organizations: No    Attends Banker Meetings: Never    Marital Status: Never married  Intimate Partner Violence: Not At Risk (01/22/2023)   Humiliation, Afraid, Rape, and Kick questionnaire    Fear of Current or Ex-Partner: No    Emotionally Abused: No    Physically Abused: No    Sexually Abused: No    Medications:   Current Outpatient Medications on File Prior to Visit  Medication Sig Dispense Refill   acetaminophen  (TYLENOL ) 500 MG tablet Take 500 mg by mouth as needed for moderate pain.     aspirin  EC 81 MG tablet Take 1 tablet (81 mg total) by mouth daily. Swallow whole.     atorvastatin  (LIPITOR) 40 MG tablet TAKE 1 TABLET(40 MG) BY MOUTH DAILY 90 tablet 1   clopidogrel  (PLAVIX ) 75 MG tablet Take 1 tablet (75 mg total) by mouth daily. 31 tablet 0   escitalopram  (LEXAPRO ) 20 MG tablet TAKE 1 TABLET(20 MG) BY MOUTH AT BEDTIME 90 tablet 3   LORazepam  (ATIVAN ) 0.5 MG tablet Take 1 tablet (0.5 mg total) by mouth every 8 (eight) hours as needed for anxiety. 30 tablet 3   diltiazem  (CARDIZEM ) 30 MG tablet Take 1 Tablet Every 4 Hours As Needed For HR >100 and TOP BP >100 (Laura [name] not taking: Reported on 08/25/2023)  45 tablet 1   No current facility-administered medications on file prior to visit.    Allergies:   Allergies  Allergen Reactions   Amoxicillin Rash   Kenalog [Triamcinolone Acetonide] Rash    Rash noted after ankle injection by rheumatologist   Tape Rash    Please use paper tape    Physical Exam General: Mildly obese pleasant middle-age Caucasian lady, seated, in no evident distress Head: head normocephalic and atraumatic.   Neck: supple with no carotid or supraclavicular bruits Cardiovascular: regular rate and rhythm, no murmurs Musculoskeletal: no deformity Skin:  no rash/petichiae Vascular:  Normal pulses all extremities  Neurologic Exam Mental Status: Awake and fully alert. Oriented to place and time. Recent and remote memory intact. Attention span, concentration and fund of knowledge appropriate. Mood and affect appropriate.  Cranial Nerves: Fundoscopic exam  . Pupils equal, briskly reactive to light. Extraocular movements full without nystagmus. Visual fields full to confrontation. Hearing intact. Facial sensation intact. Face, tongue, palate moves normally and symmetrically.  Motor: Normal bulk and tone. Normal strength in all tested extremity muscles. Sensory.: intact to touch , pinprick , position and vibratory sensation.  Coordination: Rapid alternating movements normal in all extremities. Finger-to-nose and heel-to-shin performed accurately bilaterally. Gait and Station: Arises from chair without difficulty. Stance is normal. Gait demonstrates normal stride length and balance . Able to heel, toe and tandem walk without difficulty.  Reflexes: 1+ and symmetric. Toes downgoing.     ASSESSMENT: 38 year old Caucasian lady with bilateral right greater than left medial thalamic infarcts in July 2024 likely due to paradoxical embolism after micturition with a high risk PFO.  Extensive negative evaluation for cryptogenic stroke.  Vascular risk factors of hyperlipidemia, mild  obesity, at risk for sleep apnea and PFO.     PLAN: I had a long d/w Laura [name] about herrecent cryptogenic stroke,  PFO  s/o closure and post procedure transient atrial fibrillation,risk for recurrent stroke/TIAs, personally independently reviewed imaging studies and stroke evaluation results and answered questions.Continue aspirin  81 mg  and Plavix  now and stop eliquis   for secondary stroke prevention and maintain strict control of hypertension with blood pressure goal below 130/90,  diabetes with hemoglobin A1c goal below 6.5% and lipids with LDL cholesterol goal below 70 mg/dL. I also advised the Laura [name] to eat a healthy diet with plenty of whole grains, cereals, fruits and vegetables, exercise regularly and maintain ideal body weight .   I encouraged her to keep her home sleep apnea testing appointment  and follow-up with Dr. Omar Bibber for the same.   Repeat beta-2  glycoprotein levels as they were mildly elevated T but with negative lupus anticoagulant and anticardiolipin antibodies these of unclear significance and I do not married this merits long-term anticoagulation.  Check follow-up repeat TCD bubble study to look for adequacy of endovascular PFO closure.  Check lipid panel hemoglobin A1c, CBC and iron  studies given her significant recent perimenstrual hemorrhage and prior history of low iron .  Return for follow-up to see me in 1 year or call earlier if necessary. Greater than 50% time during this  35 visit was spent in counseling and coordination of care about her cryptogenic stroke and discussion about her high risk PFO treatment options and need for aggressive risk factor modification and answering questions. Ardella Beaver, MD Note: This document was prepared with digital dictation and possible smart phrase technology. Any transcriptional errors that result from this process are unintentional.

## 2023-08-25 NOTE — Patient Instructions (Signed)
 I had a long d/w patient about herrecent cryptogenic stroke,  PFO  s/o closure and post procedure transient atrial fibrillation,risk for recurrent stroke/TIAs, personally independently reviewed imaging studies and stroke evaluation results and answered questions.Continue aspirin  81 mg  and Plavix  now and stop eliquis   for secondary stroke prevention and maintain strict control of hypertension with blood pressure goal below 130/90, diabetes with hemoglobin A1c goal below 6.5% and lipids with LDL cholesterol goal below 70 mg/dL. I also advised the patient to eat a healthy diet with plenty of whole grains, cereals, fruits and vegetables, exercise regularly and maintain ideal body weight .check repeat lipid profile today and if LDL is suboptimal consider increasing Lipitor dose to 80 mg daily.  I encouraged her to keep her home sleep apnea testing appointment  and follow-up with Dr. Omar Bibber for the same.   Repeat beta-2  glycoprotein levels as they were mildly elevated T but with negative lupus anticoagulant and anticardiolipin antibodies these of unclear significance and I do not married this merits long-term anticoagulation.  Check follow-up repeat TCD bubble study to look for adequacy of endovascular PFO closure.  Check lipid panel hemoglobin A1c, CBC and iron  studies given her significant recent perimenstrual hemorrhage and prior history of low iron .  Return for follow-up to see me in 1 year or call earlier if necessary.

## 2023-08-26 LAB — LIPID PANEL
Chol/HDL Ratio: 6.3 ratio — ABNORMAL HIGH (ref 0.0–4.4)
Cholesterol, Total: 182 mg/dL (ref 100–199)
HDL: 29 mg/dL — ABNORMAL LOW (ref >39)
LDL Chol Calc (NIH): 119 mg/dL — ABNORMAL HIGH (ref 0–99)
Triglycerides: 190 mg/dL — ABNORMAL HIGH (ref 0–149)
VLDL Cholesterol Cal: 34 mg/dL (ref 5–40)

## 2023-08-26 LAB — CBC
Hematocrit: 40.9 % (ref 34.0–46.6)
Hemoglobin: 12.6 g/dL (ref 11.1–15.9)
MCH: 25.1 pg — ABNORMAL LOW (ref 26.6–33.0)
MCHC: 30.8 g/dL — ABNORMAL LOW (ref 31.5–35.7)
MCV: 82 fL (ref 79–97)
Platelets: 359 10*3/uL (ref 150–450)
RBC: 5.02 x10E6/uL (ref 3.77–5.28)
RDW: 15.2 % (ref 11.7–15.4)
WBC: 10.8 10*3/uL (ref 3.4–10.8)

## 2023-08-26 LAB — HEMOGLOBIN A1C
Est. average glucose Bld gHb Est-mCnc: 128 mg/dL
Hgb A1c MFr Bld: 6.1 % — ABNORMAL HIGH (ref 4.8–5.6)

## 2023-08-26 LAB — IRON,TIBC AND FERRITIN PANEL
Ferritin: 19 ng/mL (ref 15–150)
Iron Saturation: 8 % — CL (ref 15–55)
Iron: 30 ug/dL (ref 27–159)
Total Iron Binding Capacity: 390 ug/dL (ref 250–450)
UIBC: 360 ug/dL (ref 131–425)

## 2023-08-27 ENCOUNTER — Other Ambulatory Visit: Payer: Self-pay | Admitting: Nurse Practitioner

## 2023-08-29 ENCOUNTER — Ambulatory Visit: Payer: Self-pay | Admitting: Neurology

## 2023-08-29 NOTE — Progress Notes (Signed)
 Kindly inform the patient that cholesterol profile is not satisfactory and bad cholesterol is still high I would recommend she increase the dose of Lipitor to 80 mg daily. Iron  profile is mostly okay except for low iron  saturation.  Kindly discuss this with her primary care physician for further action if needed CBC blood count is satisfactory Screening blood test for diabetes suggest borderline risk and is 6.1 is acceptable.

## 2023-10-01 ENCOUNTER — Ambulatory Visit (HOSPITAL_COMMUNITY)
Admission: RE | Admit: 2023-10-01 | Discharge: 2023-10-01 | Disposition: A | Discharge status: Home / Self Care | Source: Ambulatory Visit | Attending: Neurology | Admitting: Neurology

## 2023-10-01 DIAGNOSIS — Z8774 Personal history of (corrected) congenital malformations of heart and circulatory system: Secondary | ICD-10-CM | POA: Diagnosis not present

## 2023-10-01 DIAGNOSIS — Z9889 Other specified postprocedural states: Secondary | ICD-10-CM

## 2023-10-01 DIAGNOSIS — Z09 Encounter for follow-up examination after completed treatment for conditions other than malignant neoplasm: Secondary | ICD-10-CM

## 2023-10-01 DIAGNOSIS — I639 Cerebral infarction, unspecified: Secondary | ICD-10-CM | POA: Diagnosis present

## 2023-10-02 NOTE — Progress Notes (Signed)
Kindly inform the patient that transcranial Doppler study was normal

## 2023-10-08 ENCOUNTER — Ambulatory Visit: Admitting: Nurse Practitioner

## 2023-10-08 ENCOUNTER — Encounter: Payer: Self-pay | Admitting: Nurse Practitioner

## 2023-10-08 VITALS — BP 122/82 | HR 72 | Wt 270.2 lb

## 2023-10-08 DIAGNOSIS — E611 Iron deficiency: Secondary | ICD-10-CM | POA: Diagnosis not present

## 2023-10-08 DIAGNOSIS — E782 Mixed hyperlipidemia: Secondary | ICD-10-CM | POA: Diagnosis not present

## 2023-10-08 DIAGNOSIS — K760 Fatty (change of) liver, not elsewhere classified: Secondary | ICD-10-CM

## 2023-10-08 DIAGNOSIS — Z6841 Body Mass Index (BMI) 40.0 and over, adult: Secondary | ICD-10-CM

## 2023-10-08 DIAGNOSIS — R202 Paresthesia of skin: Secondary | ICD-10-CM

## 2023-10-08 DIAGNOSIS — R7303 Prediabetes: Secondary | ICD-10-CM

## 2023-10-08 DIAGNOSIS — I480 Paroxysmal atrial fibrillation: Secondary | ICD-10-CM

## 2023-10-08 MED ORDER — ZEPBOUND 2.5 MG/0.5ML ~~LOC~~ SOAJ: 2.5000 mg | SUBCUTANEOUS | 0 refills | Status: DC

## 2023-10-08 NOTE — Patient Instructions (Addendum)
 I have sent the order for the x-ray to [facility] at [address]    I sent the referral to GI, but I am also testing for the Celiac antibodies.   Lets try the Zepbound 2.5mg  for a month and see how you tolerate this.  Continue using the glucose monitor.

## 2023-10-08 NOTE — Progress Notes (Signed)
 Catheline Doing, DNP, AGNP-c [facility]  [address] [place], [place] [address] [phone]  ESTABLISHED PATIENT- Chronic Health and/or Follow-Up Visit  Blood pressure 122/82, pulse 72, weight 270 lb 3.2 oz (122.6 kg).   History of Present Illness Laura [name] is a 38 year old female with a history of stroke and iron  deficiency anemia who presents with numbness and burning sensation in the left thigh.  She has been experiencing a persistent numb spot on her left thigh, which has recently worsened. The numbness is located at the site of her injections and is described as a deep sensation with numb skin. Recently, it has been associated with a burning sensation when standing at work. No weakness is noted, and walking does not exacerbate the symptoms, only standing does. She has a history of uneven legs and has not visited a chiropractor since her stroke last year.  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 At that time, an orthopedic urgent care provider told her it was not carpal tunnel syndrome, but insurance did not cover imaging without prior physical therapy, which she could not afford.  She has a history of stroke due to a patent foramen ovale (PFO) which was closed in November. She experienced atrial fibrillation with rapid ventricular response (AFib RVR) post-procedure, which was resolved with cardioversion. She was on two blood thinners post-procedure, which exacerbated her menstrual bleeding, leading to dizziness and fatigue. Her periods have been heavy since being on blood thinners, and she has a history of heavy periods even before the stroke.  She has iron  deficiency anemia, with a history of low iron  saturation and ferritin levels. She underwent iron  infusions in February. Despite efforts to consume iron -rich foods and supplements, her iron  levels remain low.  She  has a family history of her father having a hip replacement at 62, and she is concerned about her own joint health. She also has rheumatoid arthritis and is aware that her weight may contribute to her symptoms.  She has been working with a therapist and a nutrition therapist to address body image issues and disordered eating. She is focusing on eating more regularly and increasing hydration. She has been using a continuous glucose monitor to better understand her dietary impact on blood sugar levels. Her A1c was recently 6.1.   All ROS negative with exception of what is listed above.   PHYSICAL EXAM Physical Exam Vitals and nursing note reviewed.  Constitutional:      General: She is not in acute distress.    Appearance: Normal appearance. She is obese. She is not ill-appearing.  HENT:     Head: Normocephalic.   Eyes:     Conjunctiva/sclera: Conjunctivae normal.    Cardiovascular:     Rate and Rhythm: Normal rate and regular rhythm.     Pulses: Normal pulses.     Heart sounds: Normal heart sounds.  Pulmonary:     Effort: Pulmonary effort is normal.     Breath sounds: Normal breath sounds.  Abdominal:     General: There is no distension.     Palpations: Abdomen is soft.     Tenderness: There is no abdominal tenderness.   Musculoskeletal:     Right lower leg: No edema.     Left lower leg: No edema.   Skin:    General: Skin is warm and dry.     Capillary Refill: Capillary refill takes  less than 2 seconds.   Neurological:     Mental Status: She is alert and oriented to person, place, and time.     Sensory: Sensory deficit present.     Motor: No weakness.     Gait: Gait normal.   Psychiatric:        Mood and Affect: Mood normal.        Behavior: Behavior normal.      PLAN Problem List Items Addressed This Visit     Paresthesia of left leg - Primary   Relevant Orders   DG Lumbar Spine 2-3 Views   Iron  deficiency   Relevant Orders   CBC with Differential    Ambulatory referral to Gastroenterology   Glia (IgA/G) + tTG IgA   Iron , TIBC and Ferritin Panel   Comprehensive metabolic panel with GFR   Hemoglobin A1c   Paroxysmal atrial fibrillation (HCC)   Hepatic steatosis   Relevant Orders   CBC with Differential   Ambulatory referral to Gastroenterology   Glia (IgA/G) + tTG IgA   Iron , TIBC and Ferritin Panel   Comprehensive metabolic panel with GFR   Hemoglobin A1c   Mixed hyperlipidemia   Relevant Orders   CBC with Differential   Ambulatory referral to Gastroenterology   Glia (IgA/G) + tTG IgA   Iron , TIBC and Ferritin Panel   Comprehensive metabolic panel with GFR   Hemoglobin A1c   Prediabetes   Relevant Orders   CBC with Differential   Ambulatory referral to Gastroenterology   Glia (IgA/G) + tTG IgA   Iron , TIBC and Ferritin Panel   Comprehensive metabolic panel with GFR   Hemoglobin A1c  Meralgia Paresthetica Chronic numbness and burning sensation in the left lateral thigh, worsening with standing. Possible nerve impingement due to anatomical imbalance or slipped disc. Differential includes neuralgia parasitica, potentially exacerbated by standing. No weakness reported. Uneven leg length and spinal imbalance may contribute to nerve impingement. Consideration of a pinched nerve in the waist, groin, or buttock area, exacerbated by standing or sitting. - Order x-ray of the back to assess for spinal alignment and potential nerve impingement.  Iron  Deficiency Anemia Heavy menstrual bleeding, exacerbated by previous use of anticoagulants, leading to significant iron  deficiency. Recent iron  saturation was 8, indicating zero iron  stores. Previous iron  infusions in February. Concerns about absorption issues and potential underlying causes such as silent celiac disease or Crohn's disease. Heavy, irregular periods since being on anticoagulants. Consideration of progesterone-only contraceptive pills as a safe option post-stroke for managing  heavy periods. Information provided to discuss with GYN.  - Order celiac antibody test. - Refer to gastroenterologist for further evaluation of potential absorption issues. - Consider iron  infusion if iron  levels are significantly low. - Discuss progesterone-only contraceptive options with gynecologist.  Weight Management Body image issues and disordered eating. Currently working with a therapist and nutritionist to establish regular eating patterns and improve hydration. Previous use of Wegovy  led to significant weight loss but was associated with unhealthy eating patterns. Interest in trying weight loss medication again to aid in weight management while maintaining healthy eating habits. Concerns about the impact of weight on overall health, including joint health and metabolic parameters. Discussed the use of Mounjaro, which has shown to reduce inflammation and cardiovascular risks, as a potential aid in weight management. Emphasized the importance of maintaining regular meals and not reverting to disordered eating patterns. - Provide sample of Mounjaro for trial to assess tolerance and effectiveness. - Advise on spacing doses of  Mounjaro based on tolerance, with flexibility in dosing interval.  General Health Maintenance Emphasis on high protein intake and balanced meals to support overall health and weight management. Use of continuous glucose monitor to understand dietary impacts on blood sugar levels. - Provide dietary guidance with high protein, high fiber, balanced carb meal plan. - Encourage use of continuous glucose monitor to track dietary impacts on blood sugar.  Follow-up Monitor response to new medication and dietary changes. Importance of follow-up to assess effectiveness and make necessary adjustments. Flexibility in follow-up timing based on medication response and patient needs. - Schedule virtual follow-up in 4-6 weeks to assess response to Mounjaro and dietary changes. -  Advise her to message if medication runs out before follow-up.  Return in about 4 weeks (around 11/05/2023) for 4-6 wk virtual f/u weight med.  Catheline Doing, DNP, AGNP-c

## 2023-10-12 ENCOUNTER — Other Ambulatory Visit (HOSPITAL_COMMUNITY): Payer: Self-pay

## 2023-10-12 ENCOUNTER — Telehealth: Payer: Self-pay

## 2023-10-12 ENCOUNTER — Encounter: Payer: Self-pay | Admitting: Hematology and Oncology

## 2023-10-12 NOTE — Telephone Encounter (Signed)
 Pharmacy Patient Advocate Encounter  Insurance verification completed.   The patient is insured through [facility]   Ran test claim for Zepbound  2.5MG /0.5ML pen-injectors. Currently a quantity of 2ml is a 28 day supply.    For Obesity Drug Coverage the pt must enroll in her plans drug coverage program at    [email or web address]       This test claim was processed through [facility]- copay amounts may vary at other pharmacies due to pharmacy/plan contracts, or as the patient moves through the different stages of their insurance plan.

## 2023-10-13 ENCOUNTER — Ambulatory Visit
Admission: RE | Admit: 2023-10-13 | Discharge: 2023-10-13 | Disposition: A | Discharge status: Home / Self Care | Source: Ambulatory Visit | Attending: Nurse Practitioner | Admitting: Nurse Practitioner

## 2023-10-13 DIAGNOSIS — R202 Paresthesia of skin: Secondary | ICD-10-CM

## 2023-11-02 ENCOUNTER — Ambulatory Visit: Payer: Self-pay | Admitting: Hematology and Oncology

## 2023-11-02 ENCOUNTER — Inpatient Hospital Stay: Payer: Managed Care, Other (non HMO) | Attending: Hematology and Oncology | Admitting: Hematology and Oncology

## 2023-11-02 ENCOUNTER — Inpatient Hospital Stay

## 2023-11-02 VITALS — BP 126/74 | HR 81 | Temp 98.6°F | Resp 18 | Wt 269.3 lb

## 2023-11-02 DIAGNOSIS — Z825 Family history of asthma and other chronic lower respiratory diseases: Secondary | ICD-10-CM | POA: Insufficient documentation

## 2023-11-02 DIAGNOSIS — M47816 Spondylosis without myelopathy or radiculopathy, lumbar region: Secondary | ICD-10-CM | POA: Insufficient documentation

## 2023-11-02 DIAGNOSIS — M069 Rheumatoid arthritis, unspecified: Secondary | ICD-10-CM | POA: Insufficient documentation

## 2023-11-02 DIAGNOSIS — Z79899 Other long term (current) drug therapy: Secondary | ICD-10-CM | POA: Insufficient documentation

## 2023-11-02 DIAGNOSIS — Z9049 Acquired absence of other specified parts of digestive tract: Secondary | ICD-10-CM | POA: Insufficient documentation

## 2023-11-02 DIAGNOSIS — Z87440 Personal history of urinary (tract) infections: Secondary | ICD-10-CM | POA: Diagnosis not present

## 2023-11-02 DIAGNOSIS — Z7982 Long term (current) use of aspirin: Secondary | ICD-10-CM | POA: Diagnosis not present

## 2023-11-02 DIAGNOSIS — Z9089 Acquired absence of other organs: Secondary | ICD-10-CM | POA: Diagnosis not present

## 2023-11-02 DIAGNOSIS — Z880 Allergy status to penicillin: Secondary | ICD-10-CM | POA: Diagnosis not present

## 2023-11-02 DIAGNOSIS — D509 Iron deficiency anemia, unspecified: Secondary | ICD-10-CM | POA: Diagnosis not present

## 2023-11-02 DIAGNOSIS — M47817 Spondylosis without myelopathy or radiculopathy, lumbosacral region: Secondary | ICD-10-CM | POA: Insufficient documentation

## 2023-11-02 DIAGNOSIS — F419 Anxiety disorder, unspecified: Secondary | ICD-10-CM | POA: Insufficient documentation

## 2023-11-02 DIAGNOSIS — R5383 Other fatigue: Secondary | ICD-10-CM | POA: Diagnosis not present

## 2023-11-02 DIAGNOSIS — Z8673 Personal history of transient ischemic attack (TIA), and cerebral infarction without residual deficits: Secondary | ICD-10-CM | POA: Insufficient documentation

## 2023-11-02 DIAGNOSIS — Z809 Family history of malignant neoplasm, unspecified: Secondary | ICD-10-CM | POA: Insufficient documentation

## 2023-11-02 DIAGNOSIS — Z7901 Long term (current) use of anticoagulants: Secondary | ICD-10-CM | POA: Insufficient documentation

## 2023-11-02 DIAGNOSIS — F32A Depression, unspecified: Secondary | ICD-10-CM | POA: Diagnosis not present

## 2023-11-02 DIAGNOSIS — R0602 Shortness of breath: Secondary | ICD-10-CM | POA: Insufficient documentation

## 2023-11-02 DIAGNOSIS — Z8261 Family history of arthritis: Secondary | ICD-10-CM | POA: Insufficient documentation

## 2023-11-02 DIAGNOSIS — Z800 Family history of malignant neoplasm of digestive organs: Secondary | ICD-10-CM | POA: Diagnosis not present

## 2023-11-02 DIAGNOSIS — N920 Excessive and frequent menstruation with regular cycle: Secondary | ICD-10-CM | POA: Insufficient documentation

## 2023-11-02 LAB — CBC WITH DIFFERENTIAL/PLATELET
Abs Immature Granulocytes: 0.05 K/uL (ref 0.00–0.07)
Basophils Absolute: 0.1 K/uL (ref 0.0–0.1)
Basophils Relative: 1 %
Eosinophils Absolute: 0.2 K/uL (ref 0.0–0.5)
Eosinophils Relative: 2 %
HCT: 35.6 % — ABNORMAL LOW (ref 36.0–46.0)
Hemoglobin: 11 g/dL — ABNORMAL LOW (ref 12.0–15.0)
Immature Granulocytes: 1 %
Lymphocytes Relative: 20 %
Lymphs Abs: 2 K/uL (ref 0.7–4.0)
MCH: 24.1 pg — ABNORMAL LOW (ref 26.0–34.0)
MCHC: 30.9 g/dL (ref 30.0–36.0)
MCV: 78.1 fL — ABNORMAL LOW (ref 80.0–100.0)
Monocytes Absolute: 0.8 K/uL (ref 0.1–1.0)
Monocytes Relative: 8 %
Neutro Abs: 6.9 K/uL (ref 1.7–7.7)
Neutrophils Relative %: 68 %
Platelets: 332 K/uL (ref 150–400)
RBC: 4.56 MIL/uL (ref 3.87–5.11)
RDW: 15.5 % (ref 11.5–15.5)
WBC: 10 K/uL (ref 4.0–10.5)
nRBC: 0 % (ref 0.0–0.2)

## 2023-11-02 LAB — IRON AND IRON BINDING CAPACITY (CC-WL,HP ONLY)
Iron: 26 ug/dL — ABNORMAL LOW (ref 28–170)
Saturation Ratios: 5 % — ABNORMAL LOW (ref 10.4–31.8)
TIBC: 493 ug/dL — ABNORMAL HIGH (ref 250–450)
UIBC: 467 ug/dL — ABNORMAL HIGH (ref 148–442)

## 2023-11-02 LAB — FERRITIN: Ferritin: 14 ng/mL (ref 11–307)

## 2023-11-02 NOTE — Progress Notes (Signed)
 [facility] CONSULT NOTE  Patient Care Team: Early, Camie BRAVO, NP as PCP - General (Nurse Practitioner) Mealor, Eulas BRAVO, MD as PCP - Electrophysiology (Cardiology) Wendel Lurena POUR, MD as PCP - Cardiology (Cardiology) Rosemarie Eather RAMAN, MD as PCP - Family Medicine (Neurology) Ishmael Slough, MD as Consulting Physician (Rheumatology) Dannielle Bouchard, DO as Consulting Physician (Obstetrics and Gynecology) Leigh, Elspeth SQUIBB, MD as Consulting Physician (Gastroenterology)  CHIEF COMPLAINTS/PURPOSE OF CONSULTATION:  IDA  ASSESSMENT & PLAN:   This is a pleasant 39 year old female patient with rheumatoid arthritis on hydroxychloroquine  referred to hematology for evaluation and recommendations regarding iron  deficiency  Assessment and Plan Assessment & Plan Heavy Menstrual Bleeding Persistent heavy menstrual bleeding with clots, likely primary source of iron  deficiency.  - Discuss hormonal options with gynecology, such as Mirena or progestin-only pills. - Consider ablation if hormonal options are ineffective.  Iron  Deficiency Anemia Iron  deficiency anemia secondary to heavy menstrual bleeding. Ferritin improved but ongoing menstruation likely causes continued iron  loss. Open to iron  infusion if necessary. - Order blood work to assess current iron  levels. - Consider iron  infusion if iron  levels are too low. - Consider oral iron  supplementation if iron  levels are adequate.  Cryptogenic Stroke Cryptogenic stroke with successful PFO closure. No recent AFib episodes. Transcranial Doppler showed no bubbles, indicating successful closure. - Continue daily aspirin  therapy.   Thank you for consulting us  in the care of this patient please not hesitate to contact us  with any questions or concerns.  HISTORY OF PRESENTING ILLNESS:   Laura [name] 38 y.o. female is here because of IDA.    This is a very pleasant 38 year old female patient who arrived to the appointment by  herself for evaluation and recommendations for an iron  deficiency anemia.    Discussed the use of AI scribe software for clinical note transcription with the patient, who gave verbal consent to proceed.  History of Present Illness Laura [name] is a 38 year old female with a history of cryptogenic stroke and PFO closure who presents with heavy menstrual bleeding and iron  deficiency.  She has been experiencing unusually heavy menstrual bleeding since starting Zepbound  two weeks ago. Her menstrual cycles typically last five to seven days, but this cycle has been particularly heavy. She experienced lightheadedness during this cycle. She recalls a previous episode of heavy bleeding while on blood thinners, which worsened her symptoms. Currently, she is only taking daily aspirin  after completing courses of Eliquis  and Plavix .  She is experiencing symptoms of iron  deficiency, including tiredness and occasional shortness of breath when climbing stairs. Her last iron  infusion was in February, with ferritin levels improving from 7 in January to 19 in May. Despite this, she continues to experience heavy menstrual bleeding with clots, which she believes contributes to her iron  loss.  No gastrointestinal bleeding, such as bloody or black stools, and she is not a vegetarian, consuming meat regularly. No mouth sores or hematuria. Her current medications include daily aspirin . She previously tried Wegovy  for weight loss a couple of years ago but is not currently on any other weight loss medications.  Rest of the pertinent 10 point ROS reviewed and neg.  MEDICAL HISTORY:  Past Medical History:  Diagnosis Date   Anemia    Anxiety    Anxiety 12/31/2018   Arthritis    Depression    Depression    Phreesia 02/25/2020   Depressive disorder 03/28/2014   Establishing care with new doctor, encounter for 07/30/2020   Flatulence,  eructation and gas pain 07/30/2020   Frequent headaches    History of chicken  pox    Otitis media    had tubes as child, then according to pt adipose tissue used to repair ear drum   Rheumatoid arthritis (HCC)    Stroke (HCC)    UTI (lower urinary tract infection)     SURGICAL HISTORY: Past Surgical History:  Procedure Laterality Date   APPENDECTOMY  2012   HYMENECTOMY  2000   PATENT FORAMEN OVALE(PFO) CLOSURE N/A 02/26/2023   Procedure: PATENT FORAMEN OVALE(PFO) CLOSURE;  Surgeon: Wendel Lurena POUR, MD;  Location: [facility] INVASIVE CV LAB;  Service: Cardiovascular;  Laterality: N/A;   TEE WITHOUT CARDIOVERSION N/A 11/07/2022   Procedure: TRANSESOPHAGEAL ECHOCARDIOGRAM;  Surgeon: Pietro Redell RAMAN, MD;  Location: [facility] INVASIVE CV LAB;  Service: Cardiovascular;  Laterality: N/A;   TONSILECTOMY, ADENOIDECTOMY, BILATERAL MYRINGOTOMY AND TUBES      SOCIAL HISTORY: Social History   Socioeconomic History   Marital status: Single    Spouse name: Not on file   Number of children: 0   Years of education: Not on file   Highest education level: Not on file  Occupational History   Occupation: Corporate treasurer: [employer]  Tobacco Use   Smoking status: Never   Smokeless tobacco: Never  Vaping Use   Vaping status: Never Used  Substance and Sexual Activity   Alcohol use: Yes    Alcohol/week: 0.0 standard drinks of alcohol    Comment: occasionally    Drug use: No   Sexual activity: Not Currently    Birth control/protection: Pill  Other Topics Concern   Not on file  Social History Narrative   Work or School: Designer, 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       Social Drivers of Corporate investment banker Strain: Low Risk  (01/22/2023)   Overall Financial Resource Strain (CARDIA)    Difficulty of Paying Living Expenses: Not hard at all  Food Insecurity: No Food Insecurity (01/22/2023)   Hunger Vital Sign    Worried About Running Out  of Food in the Last Year: Never true    Ran Out of Food in the Last Year: Never true  Transportation Needs: No Transportation Needs (01/22/2023)   PRAPARE - Administrator, Civil Service (Medical): No    Lack of Transportation (Non-Medical): No  Physical Activity: Sufficiently Active (01/22/2023)   Exercise Vital Sign    Days of Exercise per Week: 3 days    Minutes of Exercise per Session: 90 min  Stress: No Stress Concern Present (01/22/2023)   [employer] of Occupational Health - Occupational Stress Questionnaire    Feeling of Stress : Only a little  Social Connections: Socially Isolated (01/22/2023)   Social Connection and Isolation Panel    Frequency of Communication with Friends and Family: Once a week    Frequency of Social Gatherings with Friends and Family: Once a week    Attends Religious Services: Never    Database administrator or Organizations: No    Attends Banker Meetings: Never    Marital Status: Never married  Intimate Partner Violence: Not At Risk (01/22/2023)   Humiliation, Afraid, Rape, and Kick questionnaire    Fear of Current or Ex-Partner: No    Emotionally Abused: No    Physically  Abused: No    Sexually Abused: No    FAMILY HISTORY: Family History  Problem Relation Age of Onset   Sleep apnea Mother    Cancer Maternal Grandmother    Cancer Maternal Grandfather    Arthritis Paternal Grandmother    Liver cancer Paternal Grandfather     ALLERGIES:  is allergic to amoxicillin, kenalog [triamcinolone acetonide], and tape.  MEDICATIONS:  Current Outpatient Medications  Medication Sig Dispense Refill   acetaminophen  (TYLENOL ) 500 MG tablet Take 500 mg by mouth as needed for moderate pain.     aspirin  EC 81 MG tablet Take 1 tablet (81 mg total) by mouth daily. Swallow whole.     atorvastatin  (LIPITOR) 40 MG tablet TAKE 1 TABLET(40 MG) BY MOUTH DAILY 90 tablet 0   diltiazem  (CARDIZEM ) 30 MG tablet Take 1 Tablet Every 4 Hours  As Needed For HR >100 and TOP BP >100 (Patient taking differently: PRN Take 1 Tablet Every 4 Hours As Needed For HR >100 and TOP BP >100) 45 tablet 1   escitalopram  (LEXAPRO ) 20 MG tablet TAKE 1 TABLET(20 MG) BY MOUTH AT BEDTIME 90 tablet 3   LORazepam  (ATIVAN ) 0.5 MG tablet Take 1 tablet (0.5 mg total) by mouth every 8 (eight) hours as needed for anxiety. 30 tablet 3   tirzepatide  (ZEPBOUND ) 2.5 MG/0.5ML Pen Inject 2.5 mg into the skin once a week. 2 mL 0   No current facility-administered medications for this visit.     PHYSICAL EXAMINATION: ECOG PERFORMANCE STATUS: 1 - Symptomatic but completely ambulatory  Vitals:   11/02/23 1235  BP: 126/74  Pulse: 81  Resp: 18  Temp: 98.6 F (37 C)  SpO2: 97%     Filed Weights   11/02/23 1235  Weight: 269 lb 5 oz (122.2 kg)   GENERAL:alert, no distress and comfortable, obese. Neck: No palpable regional adenopathy. Chest: Clear to auscultation bilaterally Heart: Rate and rhythm regular Abdomen: Soft, nondistended, nontender No lower extremity edema  LABORATORY DATA:  I have reviewed the data as listed Lab Results  Component Value Date   WBC 10.8 08/25/2023   HGB 12.6 08/25/2023   HCT 40.9 08/25/2023   MCV 82 08/25/2023   PLT 359 08/25/2023     Chemistry      Component Value Date/Time   NA 139 05/04/2023 1307   NA 139 02/20/2023 0944   K 3.7 05/04/2023 1307   CL 106 05/04/2023 1307   CO2 27 05/04/2023 1307   BUN 11 05/04/2023 1307   BUN 13 02/20/2023 0944   CREATININE 0.81 05/04/2023 1307   CREATININE 0.85 04/15/2022 1330   CREATININE 0.80 04/29/2012 1724      Component Value Date/Time   CALCIUM  8.9 05/04/2023 1307   ALKPHOS 107 05/04/2023 1307   AST 12 (L) 05/04/2023 1307   AST 13 (L) 04/15/2022 1330   ALT 14 05/04/2023 1307   ALT 13 04/15/2022 1330   BILITOT 0.4 05/04/2023 1307   BILITOT 0.3 11/17/2022 1230   BILITOT 0.4 04/15/2022 1330       RADIOGRAPHIC STUDIES: I have personally reviewed the  radiological images as listed and agreed with the findings in the report. DG Lumbar Spine 2-3 Views Result Date: 10/13/2023 CLINICAL DATA:  LEFT thigh numbness and burning sensation for 2 months. No known injury. EXAM: LUMBAR SPINE - 2-3 VIEW COMPARISON:  None available FINDINGS: Alignment within normal limits. No vertebral body height loss. Mild facet degenerative changes seen at L4-L5 and L5-S1. Minimal endplate spurring at L3-L4  and L4-L5. IMPRESSION: Mild degenerative changes of the lower lumbar spine. Electronically Signed   By: Aliene Lloyd M.D.   On: 10/13/2023 18:03    All questions were answered. The patient knows to call the clinic with any problems, questions or concerns. I spent a total of 30 minutes in the care of this patient including history and physical, review of records, counseling and coordination of care.     Amber Stalls, MD 11/02/2023 12:39 PM

## 2023-11-03 ENCOUNTER — Other Ambulatory Visit: Payer: Self-pay | Admitting: Nurse Practitioner

## 2023-11-03 ENCOUNTER — Encounter: Payer: Self-pay | Admitting: Nurse Practitioner

## 2023-11-03 DIAGNOSIS — N939 Abnormal uterine and vaginal bleeding, unspecified: Secondary | ICD-10-CM

## 2023-11-03 MED ORDER — NORETHINDRONE ACETATE 5 MG PO TABS: ORAL_TABLET | ORAL | 1 refills | Status: DC

## 2023-11-04 ENCOUNTER — Encounter: Payer: Self-pay | Admitting: *Deleted

## 2023-11-04 ENCOUNTER — Ambulatory Visit: Payer: Self-pay | Admitting: Nurse Practitioner

## 2023-11-17 ENCOUNTER — Telehealth: Admitting: Nurse Practitioner

## 2023-11-17 ENCOUNTER — Encounter: Payer: Self-pay | Admitting: Nurse Practitioner

## 2023-11-17 VITALS — Wt 270.0 lb

## 2023-11-17 DIAGNOSIS — E782 Mixed hyperlipidemia: Secondary | ICD-10-CM | POA: Diagnosis not present

## 2023-11-17 DIAGNOSIS — N939 Abnormal uterine and vaginal bleeding, unspecified: Secondary | ICD-10-CM

## 2023-11-17 DIAGNOSIS — K760 Fatty (change of) liver, not elsewhere classified: Secondary | ICD-10-CM

## 2023-11-17 DIAGNOSIS — R7303 Prediabetes: Secondary | ICD-10-CM

## 2023-11-17 DIAGNOSIS — E611 Iron deficiency: Secondary | ICD-10-CM

## 2023-11-17 DIAGNOSIS — Z6841 Body Mass Index (BMI) 40.0 and over, adult: Secondary | ICD-10-CM

## 2023-11-17 DIAGNOSIS — I480 Paroxysmal atrial fibrillation: Secondary | ICD-10-CM

## 2023-11-17 DIAGNOSIS — R110 Nausea: Secondary | ICD-10-CM

## 2023-11-17 DIAGNOSIS — E669 Obesity, unspecified: Secondary | ICD-10-CM

## 2023-11-17 DIAGNOSIS — E66813 Obesity, class 3: Secondary | ICD-10-CM

## 2023-11-17 MED ORDER — ZEPBOUND 2.5 MG/0.5ML ~~LOC~~ SOAJ: 2.5000 mg | SUBCUTANEOUS | 0 refills | Status: DC

## 2023-11-17 MED ORDER — ONDANSETRON 4 MG PO TBDP: 4.0000 mg | ORAL_TABLET | Freq: Three times a day (TID) | ORAL | 2 refills | Status: AC | PRN

## 2023-11-17 NOTE — Progress Notes (Unsigned)
 Virtual Visit Encounter mychart visit.   I connected with  Laura [name] on 11/18/23 at  4:00 PM EDT by secure video and audio telemedicine application. I verified that I am speaking with the correct person using two identifiers.   I introduced myself as a Publishing rights manager with the practice. The limitations of evaluation and management by telemedicine discussed with the patient and the availability of in person appointments. The patient expressed verbal understanding and consent to proceed.  Participating parties in this visit include: Myself and patient  The patient is: Patient Location: Home I am: Provider Location: Office/Clinic Subjective:    CC and HPI: History of Present Illness Laura [name] is a 38 year old female who presents for follow-up on diet changes, Mounjaro use, and menstrual issues related to norethindrone .  She is experiencing issues with her insurance coverage for Mounjaro, having only taken one dose due to enrollment requirements in a Weight Watchers program. She has enrolled in the program and has been working on diet and exercise. She experienced mild nausea on the first day of taking Mounjaro, but otherwise tolerated this well.  She is experiencing significant menstrual issues. After starting norethindrone  for an exceptionally long menstrual cycle, her period stopped in 2 days but resumed heavily on July 31st, with severe symptoms on August 2nd, including heavy bleeding, nausea, diarrhea, bloating, and fatigue. She describes passing large clots and experiencing leg cramping. The bleeding lasted until August 3rd, with symptoms improving by midday. She has a history of heavy periods and is concerned about fibroids, as two cousins have had hysterectomies for fibroids. She does see GYN and plans to discuss this with her provider.   Her past medical history includes a previous experience in 2017 where a steroid injection for rheumatoid arthritis led to a  prolonged period. She has a history of atrial fibrillation and stroke, and her iron  levels have been low. She is currently not on blood thinners, which she expected would result in lighter periods. She has previously used a ParaGard IUD, which was not well tolerated due to heavy bleeding.   Key notes: Had menstrual bleeding for 17 days Took norethindrone  and bleeding stopped in 2 days.  Started period about 7 days later.  Ping pong ball sized clots   Past medical history, Surgical history, Family history not pertinant except as noted below, Social history, Allergies, and medications have been entered into the medical record, reviewed, and corrections made.   Review of Systems:  All review of systems negative except what is listed in the HPI  Objective:    Alert and oriented x 4 Speaking in clear sentences with no shortness of breath. No distress.  Impression and Recommendations:    Problem List Items Addressed This Visit     Obesity   Obesity management complicated by insurance issues with Mounjaro coverage. Enrolled in required Weight Watchers program for drug coverage. Tolerated initial dose of Mounjaro with mild nausea. Concerns about potential impact of Mounjaro on menstrual cycle, though unsupported by evidence. Has been working on diet and exercise consistently for weight management. Has enrolled in weight watchers as required. History significant for stroke, fatty liver, HLD, pre diabetes, PCOS, and atrial fibrillation.  - Resend Mounjaro prescription with note of program enrollment. - Prescribe Zofran  for nausea management. - Advise to wait for insurance approval before restarting Mounjaro.      Relevant Medications   tirzepatide  (ZEPBOUND ) 2.5 MG/0.5ML Pen   Prediabetes   History of prediabetes with significant  comorbidities including hepatic steatosis, rheumatoid arthritis, obesity, hyperlipidemia, and BMI greater than 40. Historically she has lost weight with GLP-1  treatment and this is recommended again given her history. Will resend script. DIet and exercise recommendations discussed.       Relevant Medications   tirzepatide  (ZEPBOUND ) 2.5 MG/0.5ML Pen   Hepatic steatosis   Diet and exercise recommendations provided. Mounjaro/Zepbound  recommended for assistance with management to reduce co-morbidities and increased risks to health.       Relevant Medications   tirzepatide  (ZEPBOUND ) 2.5 MG/0.5ML Pen   Mixed hyperlipidemia   Chronic. LDL level is well controlled (67) on current statin therapy. -Continue current statin therapy.       Relevant Medications   tirzepatide  (ZEPBOUND ) 2.5 MG/0.5ML Pen   Paroxysmal atrial fibrillation (HCC)   No current symptoms. She has stopped anticoagulant at this time. Monitor closely.      Relevant Medications   tirzepatide  (ZEPBOUND ) 2.5 MG/0.5ML Pen   Iron  deficiency   Iron  deficiency anemia likely exacerbated by recent heavy menstrual bleeding. Previous labs showed fluctuating iron  levels. Concerned about impact of heavy periods on iron  levels. - Monitor iron  levels, especially in light of recent heavy bleeding. - Discuss potential impact of heavy periods on iron  levels with gynecologist.      Abnormal uterine bleeding - Primary   Severe abnormal uterine bleeding with heavy bleeding and large clots lasting 17 days. Recent period began on July 31st with heavy bleeding on August 2nd, accompanied by severe cramping and nausea. Most recent period started after taking norethindrone  to stop the initial 17 day period. At this time bleeding has ceased and her symptoms have resolved. Differential includes fibroids, given family history of hysterectomies due to fibroids, PCOS. Possible correlation with Mounjaro initiation, though unclear. - Continue with gynecologist for evaluation, including possible ultrasound to assess for fibroids. - Hold off on norethindrone  and discuss alternative birth control options with  gynecologist. - Send recent medical records to Dr. Duwaine Blumenthal at [facility].      Other Visit Diagnoses       BMI 40.0-44.9, adult (HCC)       Relevant Medications   tirzepatide  (ZEPBOUND ) 2.5 MG/0.5ML Pen     Nausea       Relevant Medications   ondansetron  (ZOFRAN -ODT) 4 MG disintegrating tablet       orders and follow up as documented in EMR I discussed the assessment and treatment plan with the patient. The patient was provided an opportunity to ask questions and all were answered. The patient agreed with the plan and demonstrated an understanding of the instructions.   The patient was advised to call back or seek an in-person evaluation if the symptoms worsen or if the condition fails to improve as anticipated.  Follow-Up: in 3 months  I provided 41 minutes of non-face-to-face interaction with this non face-to-face encounter including intake, same-day documentation, and chart review.   Camie CHARLENA Doing, NP , DNP, AGNP-c [facility]

## 2023-11-18 ENCOUNTER — Encounter: Payer: Self-pay | Admitting: Nurse Practitioner

## 2023-11-18 ENCOUNTER — Other Ambulatory Visit (HOSPITAL_COMMUNITY): Payer: Self-pay

## 2023-11-18 DIAGNOSIS — N939 Abnormal uterine and vaginal bleeding, unspecified: Secondary | ICD-10-CM | POA: Insufficient documentation

## 2023-11-18 NOTE — Assessment & Plan Note (Signed)
 Iron  deficiency anemia likely exacerbated by recent heavy menstrual bleeding. Previous labs showed fluctuating iron  levels. Concerned about impact of heavy periods on iron  levels. - Monitor iron  levels, especially in light of recent heavy bleeding. - Discuss potential impact of heavy periods on iron  levels with gynecologist.

## 2023-11-18 NOTE — Assessment & Plan Note (Signed)
 History of prediabetes with significant comorbidities including hepatic steatosis, rheumatoid arthritis, obesity, hyperlipidemia, and BMI greater than 40. Historically she has lost weight with GLP-1 treatment and this is recommended again given her history. Will resend script. DIet and exercise recommendations discussed.

## 2023-11-18 NOTE — Assessment & Plan Note (Signed)
 No current symptoms. She has stopped anticoagulant at this time. Monitor closely.

## 2023-11-18 NOTE — Assessment & Plan Note (Signed)
 Severe abnormal uterine bleeding with heavy bleeding and large clots lasting 17 days. Recent period began on July 31st with heavy bleeding on August 2nd, accompanied by severe cramping and nausea. Most recent period started after taking norethindrone  to stop the initial 17 day period. At this time bleeding has ceased and her symptoms have resolved. Differential includes fibroids, given family history of hysterectomies due to fibroids, PCOS. Possible correlation with Mounjaro initiation, though unclear. - Continue with gynecologist for evaluation, including possible ultrasound to assess for fibroids. - Hold off on norethindrone  and discuss alternative birth control options with gynecologist. - Send recent medical records to Dr. Duwaine Blumenthal at [facility].

## 2023-11-18 NOTE — Assessment & Plan Note (Signed)
 Diet and exercise recommendations provided. Mounjaro/Zepbound  recommended for assistance with management to reduce co-morbidities and increased risks to health.

## 2023-11-18 NOTE — Assessment & Plan Note (Signed)
 Chronic. LDL level is well controlled (67) on current statin therapy. -Continue current statin therapy.

## 2023-11-18 NOTE — Assessment & Plan Note (Addendum)
 Obesity management complicated by insurance issues with Mounjaro coverage. Enrolled in required Weight Watchers program for drug coverage. Tolerated initial dose of Mounjaro with mild nausea. Concerns about potential impact of Mounjaro on menstrual cycle, though unsupported by evidence. Has been working on diet and exercise consistently for weight management. Has enrolled in weight watchers as required. History significant for stroke, fatty liver, HLD, pre diabetes, PCOS, and atrial fibrillation.  - Resend Mounjaro prescription with note of program enrollment. - Prescribe Zofran  for nausea management. - Advise to wait for insurance approval before restarting Mounjaro.

## 2023-11-19 ENCOUNTER — Telehealth: Payer: Self-pay | Admitting: Pharmacy Technician

## 2023-11-19 ENCOUNTER — Other Ambulatory Visit (HOSPITAL_COMMUNITY): Payer: Self-pay

## 2023-11-19 DIAGNOSIS — E782 Mixed hyperlipidemia: Secondary | ICD-10-CM

## 2023-11-19 DIAGNOSIS — Z8673 Personal history of transient ischemic attack (TIA), and cerebral infarction without residual deficits: Secondary | ICD-10-CM

## 2023-11-19 DIAGNOSIS — R7303 Prediabetes: Secondary | ICD-10-CM

## 2023-11-19 DIAGNOSIS — K760 Fatty (change of) liver, not elsewhere classified: Secondary | ICD-10-CM

## 2023-11-19 DIAGNOSIS — E66813 Obesity, class 3: Secondary | ICD-10-CM

## 2023-11-19 NOTE — Telephone Encounter (Signed)
 Pharmacy Patient Advocate Encounter   Received notification from CoverMyMeds that prior authorization for Zepbound  2.5MG /0.5ML pen-injectors is required/requested.   Insurance verification completed.   The patient is insured through [facility] .   Per test claim: PT MUST ENROLL IN WEIGHT WATCHERS THRU [facility] FOR OBESITY DRUG COVERAGE. PLEASE HAVE HER REACH OUT TO HER HEALTH FOR DETAILS.

## 2023-11-24 ENCOUNTER — Other Ambulatory Visit (HOSPITAL_COMMUNITY): Payer: Self-pay

## 2023-11-24 NOTE — Telephone Encounter (Signed)
 Pharmacy Patient Advocate Encounter   Received notification from Pt Calls Messages that prior authorization for  Zepbound  2.5MG /0.5ML pen-injectors  is required/requested.   Insurance verification completed.   The patient is insured through [facility] .   Per test claim: PA required; PA submitted to above mentioned insurance via Latent Key/confirmation #/EOC [id number] Status is pending

## 2023-11-24 NOTE — Telephone Encounter (Signed)
 Pharmacy Patient Advocate Encounter  Received notification from [employer] that Prior Authorization for Zepbound  2.5MG /0.5ML pen-injectors  has been DENIED.  Full denial letter will be uploaded to the media tab. See denial reason below.   PA #/Case ID/Reference #: [id number]     The chart notes mention patient being on Mounjaro  in both the 06/26 office visit and the 11/17/2023 video visit? Not sure if it was supposed to state Zepbound  and not Mounjaro ? Please advise

## 2023-11-25 ENCOUNTER — Telehealth: Payer: Self-pay

## 2023-11-25 ENCOUNTER — Other Ambulatory Visit (HOSPITAL_COMMUNITY): Payer: Self-pay

## 2023-11-25 MED ORDER — TIRZEPATIDE 2.5 MG/0.5ML ~~LOC~~ SOAJ: 2.5000 mg | SUBCUTANEOUS | 0 refills | Status: DC

## 2023-11-25 NOTE — Addendum Note (Signed)
 Addended by: Saul Fabiano, CAMIE E on: 11/25/2023 12:00 PM   Modules accepted: Orders

## 2023-11-25 NOTE — Telephone Encounter (Signed)
 Pharmacy Patient Advocate Encounter  (See encounter from 8/8)   Received notification from Physician's Office that prior authorization for Mounjaro  2.5MG /0.5ML auto-injector  is required/requested.   Insurance verification completed.   The patient is insured through [facility] .   Per test claim: PA required; PA submitted to above mentioned insurance via Latent Key/confirmation #/EOC [employer]   Status is pending

## 2023-11-25 NOTE — Telephone Encounter (Addendum)
 Will resubmit with Mounjaro . Patient was told she must enroll in her company's weight watchers program to qualify, which she has done.   Patient contacted today via [facility] she spoke with her insurance company and they told her that zepbound  would be approved based on BMI >30, stroke history. I have cancelled mounjaro  and resent zepbound .

## 2023-11-27 MED ORDER — TIRZEPATIDE-WEIGHT MANAGEMENT 2.5 MG/0.5ML ~~LOC~~ SOLN: 2.5000 mg | SUBCUTANEOUS | 0 refills | Status: AC

## 2023-11-27 NOTE — Telephone Encounter (Signed)
 Pharmacy Patient Advocate Encounter  Received notification from [employer] via Latent that Prior Authorization for Mounjaro  2.5MG /0.5ML auto-injector  has been DENIED.  Full denial letter will be uploaded to the media tab. See denial reason below.   Insurance will not cover due to A1C not being 6.5 or greater      PA #/Case ID/Reference #: [id number]

## 2023-11-27 NOTE — Addendum Note (Signed)
 Addended by: Adlai Nieblas, CAMIE E on: 11/27/2023 04:26 PM   Modules accepted: Orders

## 2023-12-15 ENCOUNTER — Telehealth: Payer: Self-pay

## 2023-12-15 ENCOUNTER — Encounter: Payer: Self-pay | Admitting: Hematology and Oncology

## 2023-12-15 ENCOUNTER — Other Ambulatory Visit (HOSPITAL_COMMUNITY): Payer: Self-pay

## 2023-12-15 NOTE — Telephone Encounter (Signed)
 Pharmacy Patient Advocate Encounter   Received notification from Pt Calls Messages that prior authorization for Zepbound  2.5MG /0.5ML pen-injectors is required/requested.   Insurance verification completed.   The patient is insured through [facility] .   Per test claim: PA required; PA submitted to above mentioned insurance via Latent Key/confirmation #/EOC [facility]  Status is pending

## 2023-12-15 NOTE — Telephone Encounter (Unsigned)
 Copied from CRM [id number]. Topic: Clinical - Medication Prior Auth >> Dec 15, 2023  1:02 PM [place] MATSU wrote: Reason for CRM: Patient called in today to advise that a PA is still required for Zepbound  2.5mg /0.66ml  patient is already enrolled in [employer] obesity drug coverage and is still waiting for PA.   Per patient she spoke with insurance who advise no PA has been initiated and needs notes that it is be prescribed for weight loss, what my BMI is currently, and proof that I'm not being prescribed any other weight loss meds.  May you please advise.

## 2023-12-16 ENCOUNTER — Other Ambulatory Visit (HOSPITAL_COMMUNITY): Payer: Self-pay

## 2023-12-16 NOTE — Telephone Encounter (Signed)
 Update

## 2024-02-23 ENCOUNTER — Other Ambulatory Visit: Payer: Self-pay

## 2024-02-23 ENCOUNTER — Encounter: Payer: Self-pay | Admitting: Nurse Practitioner

## 2024-02-23 DIAGNOSIS — E611 Iron deficiency: Secondary | ICD-10-CM

## 2024-02-23 DIAGNOSIS — D500 Iron deficiency anemia secondary to blood loss (chronic): Secondary | ICD-10-CM

## 2024-02-24 ENCOUNTER — Other Ambulatory Visit: Payer: Self-pay | Admitting: Nurse Practitioner

## 2024-02-29 ENCOUNTER — Ambulatory Visit (HOSPITAL_COMMUNITY)
Admission: RE | Admit: 2024-02-29 | Discharge: 2024-02-29 | Disposition: A | Discharge status: Home / Self Care | Payer: Managed Care, Other (non HMO) | Source: Ambulatory Visit | Attending: Cardiology | Admitting: Cardiology

## 2024-02-29 ENCOUNTER — Ambulatory Visit: Payer: Managed Care, Other (non HMO) | Admitting: Physician Assistant

## 2024-02-29 ENCOUNTER — Ambulatory Visit: Payer: Self-pay | Admitting: Physician Assistant

## 2024-02-29 VITALS — BP 128/80 | HR 75 | Ht 66.0 in | Wt 275.6 lb

## 2024-02-29 DIAGNOSIS — Z8774 Personal history of (corrected) congenital malformations of heart and circulatory system: Secondary | ICD-10-CM

## 2024-02-29 DIAGNOSIS — Q2112 Patent foramen ovale: Secondary | ICD-10-CM | POA: Diagnosis present

## 2024-02-29 DIAGNOSIS — I480 Paroxysmal atrial fibrillation: Secondary | ICD-10-CM | POA: Insufficient documentation

## 2024-02-29 DIAGNOSIS — Z8673 Personal history of transient ischemic attack (TIA), and cerebral infarction without residual deficits: Secondary | ICD-10-CM | POA: Insufficient documentation

## 2024-02-29 LAB — ECHOCARDIOGRAM LIMITED BUBBLE STUDY: S' Lateral: 2.9 cm

## 2024-02-29 NOTE — Patient Instructions (Signed)
 Medication Instructions:  Your physician recommends that you continue on your current medications as directed. Please refer to the Current Medication list given to you today.  *If you need a refill on your cardiac medications before your next appointment, please call your pharmacy*  Lab Work: None needed If you have labs (blood work) drawn today and your tests are completely normal, you will receive your results only by: MyChart Message (if you have MyChart) OR A paper copy in the mail If you have any lab test that is abnormal or we need to change your treatment, we will call you to review the results.  Testing/Procedures: None needed  Follow-Up: At [facility], you and your health needs are our priority.  As part of our continuing mission to provide you with exceptional heart care, our providers are all part of one team.  This team includes your primary Cardiologist (physician) and Advanced Practice Providers or APPs (Physician Assistants and Nurse Practitioners) who all work together to provide you with the care you need, when you need it.  Your next appointment:   As needed  Provider:   Arun K Thukkani, MD    We recommend signing up for the patient portal called MyChart.  Sign up information is provided on this After Visit Summary.  MyChart is used to connect with patients for Virtual Visits (Telemedicine).  Patients are able to view lab/test results, encounter notes, upcoming appointments, etc.  Non-urgent messages can be sent to your provider as well.   To learn more about what you can do with MyChart, go to [email or web address].

## 2024-02-29 NOTE — Progress Notes (Signed)
 [facility]   MULTIDISCIPLINARY HEART VALVE CLINIC                                     Cardiology Office Note:    Date:  02/29/2024   ID:  Laura [name], DOB 1985-09-04, MRN [id number]  PCP:  Oris Camie BRAVO, NP  [facility] Cardiologist:  Lurena MARLA Red, MD  [facility] Structural heart: Lurena MARLA Red, MD [facility] Electrophysiologist:  Eulas BRAVO [place], MD   Referring MD: Oris Camie BRAVO, NP   1 year s/p PFO closure.   History of Present Illness:    Laura [name] is a 38 y.o. female with a hx of depression/anxiety, rheumatoid arthritis on Humira and Plaquenil , morbid obesity (BMI 40), iron  deficiency anemia, menorrhagia, cryptogenic CVA and PFO s/p PFO closure (02/26/24) who presents to clinic for follow up.  She initially presented with stroke symptoms of diplopia 10/2022. Head CT, CTA head and neck were negative however MRI showed bilateral thalamic infarction. Bubble study echocardiogram and TEE were positive for intracardiac shunt. Monitor was negative for atrial fibrillation and hypercoagulable labs were also negative. She has a recurrent episode of diplopia and was seen in the ER on 01/27/23. MRI was negative for acute changes and she was restarted on Plavix  75mg  daily. S/p PFO closure with 30mm Gore Cardioform device on 02/26/23. She presented back to the ED 03/21/23 with palpitations found to be in new onset AF with RVR. She was treated with diltiazem  and was started on Eliquis . Follow up 30 day monitor in 07/2023 showed sinus with rare ectopy. She was taken off of Eliquis  and restarted a baby aspirin .  Transcranial doppler in 09/2023 was normal.   Today the patient presents to clinic for follow up. No CP or SOB. No LE edema, orthopnea or PND. No dizziness or syncope. No blood in stool or urine. No palpitations. No neurologic symptoms. Has had a lot of stress at work-she works for [employer] and was at work until 2am last night.     Past Medical  History:  Diagnosis Date   Anemia    Anxiety    Anxiety 12/31/2018   Arthritis    Depression    Depression    [employer] 02/25/2020   Depressive disorder 03/28/2014   Establishing care with new doctor, encounter for 07/30/2020   Flatulence, eructation and gas pain 07/30/2020   Frequent headaches    History of chicken pox    Hypercoagulable state due to paroxysmal atrial fibrillation (HCC) 03/26/2023   Otitis media    had tubes as child, then according to pt adipose tissue used to repair ear drum   Rheumatoid arthritis (HCC)    Stroke (HCC)    UTI (lower urinary tract infection)      Current Medications: Current Meds  Medication Sig   acetaminophen  (TYLENOL ) 500 MG tablet Take 500 mg by mouth as needed for moderate pain.   aspirin  EC 81 MG tablet Take 1 tablet (81 mg total) by mouth daily. Swallow whole.   atorvastatin  (LIPITOR) 40 MG tablet TAKE 1 TABLET(40 MG) BY MOUTH DAILY   diltiazem  (CARDIZEM ) 30 MG tablet Take 1 Tablet Every 4 Hours As Needed For HR >100 and TOP BP >100   escitalopram  (LEXAPRO ) 20 MG tablet TAKE 1 TABLET(20 MG) BY MOUTH AT BEDTIME   LORazepam  (ATIVAN ) 0.5 MG tablet Take 1 tablet (0.5 mg total) by  mouth every 8 (eight) hours as needed for anxiety.   ondansetron  (ZOFRAN -ODT) 4 MG disintegrating tablet Take 1 tablet (4 mg total) by mouth every 8 (eight) hours as needed for nausea or vomiting.   tirzepatide  (ZEPBOUND ) 2.5 MG/0.5ML injection vial Inject 2.5 mg into the skin once a week.      ROS:   Please see the history of present illness.    All other systems reviewed and are negative.  EKGs       Risk Assessment/Calculations:    CHA2DS2-VASc Score = 3   This indicates a 3.2% annual risk of stroke. The patient's score is based upon: CHF History: 0 HTN History: 0 Diabetes History: 0 Stroke History: 2 Vascular Disease History: 0 Age Score: 0 Gender Score: 1           Physical Exam:    VS:  BP 128/80   Pulse 75   Ht 5' 6 (1.676 m)    Wt 275 lb 9.6 oz (125 kg)   SpO2 96%   BMI 44.48 kg/m     Wt Readings from Last 3 Encounters:  02/29/24 275 lb 9.6 oz (125 kg)  11/17/23 270 lb (122.5 kg)  11/02/23 269 lb 5 oz (122.2 kg)     GEN: Well nourished, well developed in no acute distress NECK: No JVD CARDIAC: RRR, no murmurs, rubs, gallops RESPIRATORY:  Clear to auscultation without rales, wheezing or rhonchi  ABDOMEN: Soft, non-tender, non-distended EXTREMITIES:  No edema; No deformity.    ASSESSMENT:    1. S/P percutaneous patent foramen ovale closure   2. Paroxysmal atrial fibrillation (HCC)   3. Morbid obesity, unspecified obesity type (HCC)   4. History of CVA (cerebrovascular accident)     PLAN:    In order of problems listed above:  S/p PFO closure:  -- Doing well with no new neurologic complaints.  -- Echo today with normal device placement and no atrial level shunting. -- Continue Aspirin  81mg  daily. -- No longer requires SBE prophylaxis as she is out past 6 months.   -- Follow up PRN.  PAF: -- Likely related to PFO closure with no recurrence. -- 30 day monitor in 07/2023 showed sinus with rare ectopy. -- Taken off OAC.   Morbid obesity: -- Continue tirzepatide  2.5mg  q weekly  Hx of CVA: -- Continue aspirin  81 mg daily and atorvastatin  40mg  daily.     Medication Adjustments/Labs and Tests Ordered: Current medicines are reviewed at length with the patient today.  Concerns regarding medicines are outlined above.  No orders of the defined types were placed in this encounter.  No orders of the defined types were placed in this encounter.   Patient Instructions  Medication Instructions:  Your physician recommends that you continue on your current medications as directed. Please refer to the Current Medication list given to you today.  *If you need a refill on your cardiac medications before your next appointment, please call your pharmacy*  Lab Work: None needed If you have labs (blood  work) drawn today and your tests are completely normal, you will receive your results only by: MyChart Message (if you have MyChart) OR A paper copy in the mail If you have any lab test that is abnormal or we need to change your treatment, we will call you to review the results.  Testing/Procedures: None needed  Follow-Up: At [facility], you and your health needs are our priority.  As part of our continuing mission to provide you with  exceptional heart care, our providers are all part of one team.  This team includes your primary Cardiologist (physician) and Advanced Practice Providers or APPs (Physician Assistants and Nurse Practitioners) who all work together to provide you with the care you need, when you need it.  Your next appointment:   As needed  Provider:   Arun K Thukkani, MD    We recommend signing up for the patient portal called MyChart.  Sign up information is provided on this After Visit Summary.  MyChart is used to connect with patients for Virtual Visits (Telemedicine).  Patients are able to view lab/test results, encounter notes, upcoming appointments, etc.  Non-urgent messages can be sent to your provider as well.   To learn more about what you can do with MyChart, go to [email or web address].           Signed, Lamarr Hummer, PA-C  02/29/2024 1:11 PM     [facility]

## 2024-04-12 ENCOUNTER — Encounter: Payer: Self-pay | Admitting: Nurse Practitioner

## 2024-08-22 ENCOUNTER — Ambulatory Visit: Admitting: Neurology
